# Patient Record
Sex: Male | Born: 1965 | Race: White | Hispanic: No | Marital: Married | Smoking: Former smoker
Health system: Southern US, Community
[De-identification: ages and names within clinical notes are randomized; demographics above are authoritative.]

## PROBLEM LIST (undated history)

## (undated) DIAGNOSIS — F329 Major depressive disorder, single episode, unspecified: Secondary | ICD-10-CM

## (undated) DIAGNOSIS — L409 Psoriasis, unspecified: Secondary | ICD-10-CM

## (undated) DIAGNOSIS — F32A Depression, unspecified: Secondary | ICD-10-CM

## (undated) DIAGNOSIS — K7290 Hepatic failure, unspecified without coma: Secondary | ICD-10-CM

## (undated) DIAGNOSIS — K922 Gastrointestinal hemorrhage, unspecified: Secondary | ICD-10-CM

## (undated) DIAGNOSIS — I8500 Esophageal varices without bleeding: Secondary | ICD-10-CM

## (undated) DIAGNOSIS — K7682 Hepatic encephalopathy: Secondary | ICD-10-CM

## (undated) DIAGNOSIS — K7460 Unspecified cirrhosis of liver: Secondary | ICD-10-CM

## (undated) HISTORY — DX: Hepatic failure, unspecified without coma: K72.90

## (undated) HISTORY — DX: Hepatic encephalopathy: K76.82

## (undated) HISTORY — DX: Depression, unspecified: F32.A

## (undated) HISTORY — DX: Psoriasis, unspecified: L40.9

## (undated) HISTORY — PX: OTHER SURGICAL HISTORY: SHX169

## (undated) HISTORY — DX: Unspecified cirrhosis of liver: K74.60

## (undated) HISTORY — DX: Esophageal varices without bleeding: I85.00

## (undated) HISTORY — DX: Gastrointestinal hemorrhage, unspecified: K92.2

---
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
## 2019-04-22 ENCOUNTER — Encounter: Payer: Self-pay | Admitting: Gastroenterology

## 2019-04-29 ENCOUNTER — Telehealth: Payer: Self-pay

## 2019-05-07 ENCOUNTER — Ambulatory Visit: Payer: Self-pay | Admitting: Gastroenterology

## 2019-05-14 ENCOUNTER — Other Ambulatory Visit: Payer: Self-pay | Admitting: *Deleted

## 2019-05-14 ENCOUNTER — Other Ambulatory Visit: Payer: Self-pay

## 2019-05-14 ENCOUNTER — Encounter: Payer: Self-pay | Admitting: Gastroenterology

## 2019-05-14 ENCOUNTER — Ambulatory Visit (INDEPENDENT_AMBULATORY_CARE_PROVIDER_SITE_OTHER): Payer: Self-pay | Admitting: Gastroenterology

## 2019-05-14 ENCOUNTER — Encounter: Payer: Self-pay | Admitting: *Deleted

## 2019-05-14 VITALS — BP 121/80 | HR 102 | Temp 98.0°F | Ht 72.0 in | Wt 235.4 lb

## 2019-05-14 DIAGNOSIS — R188 Other ascites: Secondary | ICD-10-CM

## 2019-05-14 DIAGNOSIS — K7460 Unspecified cirrhosis of liver: Secondary | ICD-10-CM | POA: Insufficient documentation

## 2019-05-14 DIAGNOSIS — R601 Generalized edema: Secondary | ICD-10-CM

## 2019-05-14 MED ORDER — SPIRONOLACTONE 100 MG PO TABS: 100.0000 mg | ORAL_TABLET | Freq: Every day | ORAL | 5 refills | Status: DC

## 2019-05-14 NOTE — Progress Notes (Signed)
Primary Care Physician:  Monico Blitz, MD  Primary Gastroenterologist:  Garfield Cornea, MD   Chief Complaint  Patient presents with  . Ascites    Fluid in stomach,legs, and groin,cirrhosis    HPI:  Alan Phillips is a 54 y.o. male here at the request of Dr. Manuella Ghazi for further management of cirrhosis.  Patient presents with his wife today.  For the past several months he has noted increased abdominal distention and lower extremity edema.  He notes that his legs are so swollen he cannot put his shoes on.  His testicles are swollen as well.  He is able to urinate.  Over the past year he has noted that his arms and his face have thinned out quite a bit.  He has had increased abdominal bloating and distention.  Recently saw his PCP who obtained labs as outlined below.  Patient reports having an ultrasound that indicated he has cirrhosis.  Patient states he started to wean himself off of alcohol a couple months ago when he found out that he has cirrhosis he stopped drinking completely.  This was 1 month ago.  States he has been drinking for years.  He quit smoking in September 2020.  He reports postprandial vomiting off and on for years.  Saw Dr. Laural Golden in the remote past.  Patient states he was scheduled to have a colonoscopy and endoscopy, actually completed the prep but when he got to the parking lot of the hospital he could not go forward with it.  States he was nervous.  Has not been back since then.  Has not seen a physician in years.  He continues to have intermittent vomiting.  He has constant heartburn.  No dysphagia.  Bowel movements are regular.  No blood in stool or melena.  Wife is concerned that he sleeps a lot.  Takes frequent naps.  Less confusion.  Patient states he feels like he is in a fog at times.  He has had increasing difficulty breathing with abdominal distention.  Family history significant for cirrhosis, paternal uncle, in the setting of alcohol use.  Patient states he recently  was started on Lasix 20 mg daily but noted no improvement in his edema.  He was advised to go to 40 mg daily.  No significant improvement or increase notable urination.  Patient continues to drink about 96 ounces of water daily.  Patient contributed his skin color to daily tanning in tanning bed.    Current Outpatient Medications  Medication Sig Dispense Refill  . furosemide (LASIX) 40 MG tablet Take 40 mg by mouth daily.     No current facility-administered medications for this visit.    Allergies as of 05/14/2019 - Review Complete 05/14/2019  Allergen Reaction Noted  . Codeine Hives 05/14/2019    Past Medical History:  Diagnosis Date  . Psoriasis     Past Surgical History:  Procedure Laterality Date  . none      Family History  Problem Relation Age of Onset  . Cirrhosis Paternal Uncle        etoh    Social History   Socioeconomic History  . Marital status: Married    Spouse name: Not on file  . Number of children: Not on file  . Years of education: Not on file  . Highest education level: Not on file  Occupational History  . Not on file  Tobacco Use  . Smoking status: Former Smoker    Quit date: 11/14/2018    Years  since quitting: 0.4  . Smokeless tobacco: Former Systems developer    Quit date: 05/14/1986  . Tobacco comment: quit last year  Substance and Sexual Activity  . Alcohol use: Not Currently    Comment: h/o longtime etoh abuse but quit 04/2018 when he found out he has cirrhosis (05/14/19)  . Drug use: Not Currently  . Sexual activity: Not on file  Other Topics Concern  . Not on file  Social History Narrative  . Not on file   Social Determinants of Health   Financial Resource Strain:   . Difficulty of Paying Living Expenses:   Food Insecurity:   . Worried About Charity fundraiser in the Last Year:   . Arboriculturist in the Last Year:   Transportation Needs:   . Film/video editor (Medical):   Marland Kitchen Lack of Transportation (Non-Medical):   Physical  Activity:   . Days of Exercise per Week:   . Minutes of Exercise per Session:   Stress:   . Feeling of Stress :   Social Connections:   . Frequency of Communication with Friends and Family:   . Frequency of Social Gatherings with Friends and Family:   . Attends Religious Services:   . Active Member of Clubs or Organizations:   . Attends Archivist Meetings:   Marland Kitchen Marital Status:   Intimate Partner Violence:   . Fear of Current or Ex-Partner:   . Emotionally Abused:   Marland Kitchen Physically Abused:   . Sexually Abused:       ROS:  General: Negative for fever, chills, fatigue, weakness. Diminished appetite with ascites. Thinning face/arms Eyes: Negative for vision changes.  ENT: Negative for hoarseness, difficulty swallowing , nasal congestion. CV: Negative for chest pain, angina, palpitations, +dyspnea on exertion, +peripheral edema.  Respiratory: Negative for dyspnea at rest, dyspnea on exertion, cough, sputum, wheezing.  GI: See history of present illness. GU:  Negative for dysuria, hematuria, urinary incontinence, urinary frequency, nocturnal urination.  MS: Negative for joint pain, low back pain.  Derm: Negative for rash or itching.  Neuro: Negative for weakness, abnormal sensation, seizure, frequent headaches, memory loss, confusion. See hpi Psych: Negative for anxiety, depression, suicidal ideation, hallucinations.  Endo: Negative for unusual weight change.  Heme: Negative for bruising or bleeding. Allergy: Negative for rash or hives.    Physical Examination:  BP 121/80   Pulse (!) 102   Temp 98 F (36.7 C) (Temporal)   Ht 6' (1.829 m)   Wt 235 lb 6.4 oz (106.8 kg)   BMI 31.93 kg/m    General: Well-nourished, well-developed in no acute distress.  Head: Normocephalic, atraumatic.   Eyes: Conjunctiva pink, mild scleral icterus. Mouth:masked. Neck: Supple without thyromegaly, masses, or lymphadenopathy.  Lungs: Clear to auscultation bilaterally.  Heart: Regular  rate and rhythm, no murmurs rubs or gallops.  Abdomen: Bowel sounds are normal, nontender, no hepatosplenomegaly or masses, no abdominal bruits, no rebound or guarding.  Distended and tense. umb hernia reducible. Edema in dependent abd. Rectal: not performed Extremities:   No clubbing or deformities.  3+ pitting edemas to thighs.  Neuro: Alert and oriented x 4 , grossly normal neurologically.  Skin: Warm and dry, no rash. Tanned.  Psych: Alert and cooperative, normal mood and affect.  Labs: Labs from February 2021: Hemoglobin 12.9, hematocrit 34.5, MCV 98, platelets 113,000, white blood cell count 5600, glucose 118, creatinine 0.83, sodium 131, potassium 3.8, albumin 2.7, total bilirubin 6.8, alk phos 120, AST 88 elevated, ALT  32, amylase 52, lipase 37, hepatitis C antibody nonreactive, TSH 2.790  Imaging Studies: No results found.  Impression/plan:  Pleasant 54 year old gentleman presenting with decompensated liver disease in the setting of chronic alcohol abuse.  Reports ultrasound showed cirrhosis.  Suspect recent alcoholic hepatitis based on labs from 1 month ago.  He reports no alcohol in 1 month now.  He has been on Lasix 40 mg daily with little benefit with regards to peripheral edema.  Over the past year he has noted wasting in the face and arms with increasing edema in the legs and abdominal distention.  He was negative for hepatitis C, we will check hepatitis A and B viral markers.  We will screen for hemochromatosis.  Obtain labs to calculate meld na. We will schedule him for abdominal paracentesis with fluid analysis.  He will receive IV albumin 25 g at time of first paracentesis, plans to remove 4 L only this time.  If tolerated he can have greater amount removed with subsequent paracenteses.  Advised patient today, we will add Aldactone 100 mg daily to his Lasix 40 mg daily.  He will start a 2 g sodium diet.  He may require fluid restrictions is currently drinking over 2 L of water  daily.  If he does not respond to oral diuretic regimen he may require inpatient management.  We requested copy of recent ultrasound for review.  In the near future he will need an upper endoscopy to screen for esophageal varices.  Encouraged ongoing alcohol cessation.  He is aware that we will need to have frequent evaluation with [place], hepatoma screening every 6 months.  Ultrasound and will plan for liver transplant evaluation once we receive further data.

## 2019-05-14 NOTE — Patient Instructions (Signed)
1. Congratulations on your sobriety. Keep up the good work! 2. Please go to [facility] for labs. We will contact you with results as available. 3. Abdominal tap as scheduled.  4. Continue lasix 40mg  daily. Add aldactone 100mg  daily. RX sent to pharmacy.  5. Limit sodium to 2000mg  daily.    Cirrhosis  Cirrhosis is long-term (chronic) liver injury. The liver is the body's largest internal organ, and it performs many functions. It converts food into energy, removes toxic material from the blood, makes important proteins, and absorbs necessary vitamins from food. In cirrhosis, healthy liver cells are replaced by scar tissue. This prevents blood from flowing through the liver, making it difficult for the liver to function. Scarring of the liver cannot be reversed, but treatment can prevent it from getting worse. What are the causes? Common causes of this condition are hepatitis C and long-term alcohol abuse. Other causes include:  Nonalcoholic fatty liver disease. This happens when fat is deposited in the liver by causes other than alcohol.  Hepatitis B infection.  Autoimmune hepatitis. In this condition, the body's defense system (immune system) mistakenly attacks the liver cells, causing irritation and swelling (inflammation).  Diseases that cause blockage of ducts inside the liver.  Inherited liver diseases, such as hemochromatosis. This is one of the most common inherited liver diseases. In this disease, deposits of iron collect in the liver and other organs.  Reactions to certain long-term medicines, such as amiodarone, a heart medicine.  Parasitic infections. These include schistosomiasis, which is caused by a flatworm.  Long-term contact to certain toxins. These toxins include certain organic solvents, such as toluene and chloroform. What increases the risk? You are more likely to develop this condition if:  You have certain types of viral hepatitis.  You abuse alcohol,  especially if you are male.  You are overweight.  You share needles.  You have unprotected sex with someone who has viral hepatitis. What are the signs or symptoms? You may not have any signs and symptoms at first. Symptoms may not develop until the damage to your liver starts to get worse. Early symptoms may include:  Weakness and tiredness (fatigue).  Changes in sleep patterns or having trouble sleeping.  Itchiness.  Tenderness in the right-upper part of your abdomen.  Weight loss and muscle loss.  Nausea.  Loss of appetite.  Appearance of tiny blood vessels under the skin. Later symptoms may include:  Fatigue or weakness that is getting worse.  Yellow skin and eyes (jaundice).  Buildup of fluid in the abdomen (ascites). You may notice that your clothes are tight around your waist.  Weight gain.  Swelling of the feet and ankles (edema).  Trouble breathing.  Easy bruising and bleeding.  Vomiting blood.  Black or bloody stool.  Mental confusion. How is this diagnosed? Your health care provider may suspect cirrhosis based on your symptoms and medical history, especially if you have other medical conditions or a history of alcohol abuse. Your health care provider will do a physical exam to feel your liver and to check for signs of cirrhosis. He or she may perform other tests, including:  Blood tests to check: ? For hepatitis B or C. ? Kidney function. ? Liver function.  Imaging tests such as: ? MRI or CT scan to look for changes seen in advanced cirrhosis. ? Ultrasound to see if normal liver tissue is being replaced by scar tissue.  A procedure in which a long needle is used to take  a sample of liver tissue to be checked in a lab (biopsy). Liver biopsy can confirm the diagnosis of cirrhosis. How is this treated? Treatment for this condition depends on how damaged your liver is and what caused the damage. It may include treating the symptoms of cirrhosis,  or treating the underlying causes in order to slow the damage. Treatment may include:  Making lifestyle changes, such as: ? Eating a healthy diet. You may need to work with your health care provider or a diet and nutrition specialist (dietitian) to develop an eating plan. ? Restricting salt intake. ? Maintaining a healthy weight. ? Not abusing drugs or alcohol.  Taking medicines to: ? Treat liver infections or other infections. ? Control itching. ? Reduce fluid buildup. ? Reduce certain blood toxins. ? Reduce risk of bleeding from enlarged blood vessels in the stomach or esophagus (varices).  Liver transplant. In this procedure, a liver from a donor is used to replace your diseased liver. This is done if cirrhosis has caused liver failure. Other treatments and procedures may be done depending on the problems that you get from cirrhosis. Common problems include liver-related kidney failure (hepatorenal syndrome). Follow these instructions at home:   Take medicines only as told by your health care provider. Do not use medicines that are toxic to your liver. Ask your health care provider before taking any new medicines, including over-the-counter medicines.  Rest as needed.  Eat a well-balanced diet. Ask your health care provider or dietitian for more information.  Limit your salt or water intake, if your health care provider asks you to do this.  Do not drink alcohol. This is especially important if you are taking acetaminophen.  Keep all follow-up visits as told by your health care provider. This is important. Contact a health care provider if you:  Have fatigue or weakness that is getting worse.  Develop swelling of the hands, feet, legs, or face.  Have a fever.  Develop loss of appetite.  Have nausea or vomiting.  Develop jaundice.  Develop easy bruising or bleeding. Get help right away if you:  Vomit bright red blood or a material that looks like coffee  grounds.  Have blood in your stools.  Notice that your stools appear black and tarry.  Become confused.  Have chest pain or trouble breathing. Summary  Cirrhosis is chronic liver injury. Liver damage cannot be reversed. Common causes are hepatitis C and long-term alcohol abuse.  Tests used to diagnose cirrhosis include blood tests, imaging tests, and liver biopsy.  Treatment for this condition involves treating the underlying cause. Avoid alcohol, drugs, salt, and medicines that may damage your liver.  Contact your health care provider if you develop ascites, edema, jaundice, fever, nausea or vomiting, easy bruising or bleeding, or worsening fatigue. This information is not intended to replace advice given to you by your health care provider. Make sure you discuss any questions you have with your health care provider. Document Revised: 06/12/2018 Document Reviewed: 01/10/2017 Elsevier Patient Education  [place].

## 2019-05-15 ENCOUNTER — Other Ambulatory Visit (HOSPITAL_COMMUNITY)
Admission: RE | Admit: 2019-05-15 | Discharge: 2019-05-15 | Disposition: A | Discharge status: Home / Self Care | Payer: Self-pay | Source: Ambulatory Visit | Attending: Gastroenterology | Admitting: Gastroenterology

## 2019-05-15 ENCOUNTER — Encounter: Payer: Self-pay | Admitting: Gastroenterology

## 2019-05-15 DIAGNOSIS — R601 Generalized edema: Secondary | ICD-10-CM | POA: Insufficient documentation

## 2019-05-15 DIAGNOSIS — R188 Other ascites: Secondary | ICD-10-CM | POA: Insufficient documentation

## 2019-05-15 DIAGNOSIS — K7460 Unspecified cirrhosis of liver: Secondary | ICD-10-CM | POA: Insufficient documentation

## 2019-05-15 LAB — CBC WITH DIFFERENTIAL/PLATELET
Abs Immature Granulocytes: 0.02 10*3/uL (ref 0.00–0.07)
Basophils Absolute: 0.1 10*3/uL (ref 0.0–0.1)
Basophils Relative: 1 %
Eosinophils Absolute: 0.2 10*3/uL (ref 0.0–0.5)
Eosinophils Relative: 3 %
HCT: 33.7 % — ABNORMAL LOW (ref 39.0–52.0)
Hemoglobin: 11.5 g/dL — ABNORMAL LOW (ref 13.0–17.0)
Immature Granulocytes: 0 %
Lymphocytes Relative: 21 %
Lymphs Abs: 1.2 10*3/uL (ref 0.7–4.0)
MCH: 36.6 pg — ABNORMAL HIGH (ref 26.0–34.0)
MCHC: 34.1 g/dL (ref 30.0–36.0)
MCV: 107.3 fL — ABNORMAL HIGH (ref 80.0–100.0)
Monocytes Absolute: 0.9 10*3/uL (ref 0.1–1.0)
Monocytes Relative: 16 %
Neutro Abs: 3.1 10*3/uL (ref 1.7–7.7)
Neutrophils Relative %: 59 %
Platelets: 131 10*3/uL — ABNORMAL LOW (ref 150–400)
RBC: 3.14 MIL/uL — ABNORMAL LOW (ref 4.22–5.81)
RDW: 12.9 % (ref 11.5–15.5)
WBC: 5.4 10*3/uL (ref 4.0–10.5)
nRBC: 0 % (ref 0.0–0.2)

## 2019-05-15 LAB — IRON AND TIBC
Iron: 85 ug/dL (ref 45–182)
Saturation Ratios: 43 % — ABNORMAL HIGH (ref 17.9–39.5)
TIBC: 199 ug/dL — ABNORMAL LOW (ref 250–450)
UIBC: 114 ug/dL

## 2019-05-15 LAB — COMPREHENSIVE METABOLIC PANEL
ALT: 32 U/L (ref 0–44)
AST: 68 U/L — ABNORMAL HIGH (ref 15–41)
Albumin: 2.4 g/dL — ABNORMAL LOW (ref 3.5–5.0)
Alkaline Phosphatase: 82 U/L (ref 38–126)
Anion gap: 9 (ref 5–15)
BUN: 11 mg/dL (ref 6–20)
CO2: 29 mmol/L (ref 22–32)
Calcium: 8.3 mg/dL — ABNORMAL LOW (ref 8.9–10.3)
Chloride: 93 mmol/L — ABNORMAL LOW (ref 98–111)
Creatinine, Ser: 0.75 mg/dL (ref 0.61–1.24)
GFR calc Af Amer: >60 mL/min (ref >60)
GFR calc non Af Amer: >60 mL/min (ref >60)
Glucose, Bld: 130 mg/dL — ABNORMAL HIGH (ref 70–99)
Potassium: 3.1 mmol/L — ABNORMAL LOW (ref 3.5–5.1)
Sodium: 131 mmol/L — ABNORMAL LOW (ref 135–145)
Total Bilirubin: 4 mg/dL — ABNORMAL HIGH (ref 0.3–1.2)
Total Protein: 6.9 g/dL (ref 6.5–8.1)

## 2019-05-15 LAB — HEPATITIS A ANTIBODY, TOTAL: hep A Total Ab: NONREACTIVE

## 2019-05-15 LAB — HEPATITIS B SURFACE ANTIBODY,QUALITATIVE: Hep B S Ab: NONREACTIVE

## 2019-05-15 LAB — HEPATITIS B SURFACE ANTIGEN: Hepatitis B Surface Ag: NONREACTIVE

## 2019-05-15 LAB — FERRITIN: Ferritin: 541 ng/mL — ABNORMAL HIGH (ref 24–336)

## 2019-05-15 LAB — PROTIME-INR
INR: 1.6 — ABNORMAL HIGH (ref 0.8–1.2)
Prothrombin Time: 19.3 seconds — ABNORMAL HIGH (ref 11.4–15.2)

## 2019-05-15 LAB — HEPATITIS B CORE ANTIBODY, TOTAL: Hep B Core Total Ab: NONREACTIVE

## 2019-05-16 ENCOUNTER — Other Ambulatory Visit: Payer: Self-pay

## 2019-05-16 ENCOUNTER — Ambulatory Visit (HOSPITAL_COMMUNITY)
Admission: RE | Admit: 2019-05-16 | Discharge: 2019-05-16 | Disposition: A | Discharge status: Home / Self Care | Payer: Self-pay | Source: Ambulatory Visit | Attending: Gastroenterology | Admitting: Gastroenterology

## 2019-05-16 ENCOUNTER — Encounter (HOSPITAL_COMMUNITY): Payer: Self-pay

## 2019-05-16 DIAGNOSIS — E876 Hypokalemia: Secondary | ICD-10-CM

## 2019-05-16 DIAGNOSIS — R188 Other ascites: Secondary | ICD-10-CM | POA: Insufficient documentation

## 2019-05-16 LAB — BODY FLUID CELL COUNT WITH DIFFERENTIAL
Eos, Fluid: 0 %
Lymphs, Fluid: 63 %
Monocyte-Macrophage-Serous Fluid: 27 % — ABNORMAL LOW (ref 50–90)
Neutrophil Count, Fluid: 10 % (ref 0–25)
Total Nucleated Cell Count, Fluid: 71 cu mm (ref 0–1000)

## 2019-05-16 MED ORDER — POTASSIUM CHLORIDE CRYS ER 10 MEQ PO TBCR: 20.0000 meq | EXTENDED_RELEASE_TABLET | Freq: Two times a day (BID) | ORAL | Status: AC

## 2019-05-16 NOTE — Progress Notes (Signed)
Paracentesis complete no signs of distress.  

## 2019-05-18 LAB — ACID FAST SMEAR (AFB, MYCOBACTERIA): Acid Fast Smear: NEGATIVE

## 2019-05-19 ENCOUNTER — Other Ambulatory Visit: Payer: Self-pay

## 2019-05-19 ENCOUNTER — Telehealth: Payer: Self-pay | Admitting: Internal Medicine

## 2019-05-19 DIAGNOSIS — R188 Other ascites: Secondary | ICD-10-CM

## 2019-05-19 LAB — CYTOLOGY - NON PAP

## 2019-05-19 NOTE — Telephone Encounter (Signed)
Spoke with pts spouse. Pt had fluid drawn off last week. Spouse said they were told that pt will come back next  to have fluids drawn off. Spouse said they don't have an appointment to have fluid drawn off and would like our office to schedule it.

## 2019-05-19 NOTE — Telephone Encounter (Signed)
Patient likely still has significant ascites as his first tap was limited to 4 liters.   OK to schedule LVAP. Albumin per protocol. Send fluid for cell count with diff, anerobic/aerobic cultures in blood culture bottles.

## 2019-05-19 NOTE — Telephone Encounter (Signed)
Para scheduled for 05/22/19 at 9:00am, arrive at 8:30am.  Called and informed wife of para appt.

## 2019-05-19 NOTE — Telephone Encounter (Signed)
Please call patient's wife at [phone]. She has multiple questions about patient's medications and when he is due his next para.

## 2019-05-19 NOTE — Telephone Encounter (Signed)
Routing to [facility] 

## 2019-05-21 ENCOUNTER — Ambulatory Visit: Payer: Self-pay | Admitting: Physician Assistant

## 2019-05-22 ENCOUNTER — Other Ambulatory Visit: Payer: Self-pay

## 2019-05-22 ENCOUNTER — Ambulatory Visit (HOSPITAL_COMMUNITY)
Admission: RE | Admit: 2019-05-22 | Discharge: 2019-05-22 | Disposition: A | Discharge status: Home / Self Care | Payer: Self-pay | Source: Ambulatory Visit | Attending: Gastroenterology | Admitting: Gastroenterology

## 2019-05-22 ENCOUNTER — Encounter (HOSPITAL_COMMUNITY): Payer: Self-pay

## 2019-05-22 DIAGNOSIS — R188 Other ascites: Secondary | ICD-10-CM | POA: Insufficient documentation

## 2019-05-22 LAB — BODY FLUID CELL COUNT WITH DIFFERENTIAL
Eos, Fluid: 0 %
Lymphs, Fluid: 63 %
Monocyte-Macrophage-Serous Fluid: 30 % — ABNORMAL LOW (ref 50–90)
Neutrophil Count, Fluid: 7 % (ref 0–25)
Other Cells, Fluid: REACTIVE %
Total Nucleated Cell Count, Fluid: 61 cu mm (ref 0–1000)

## 2019-05-22 LAB — GRAM STAIN: Gram Stain: NONE SEEN

## 2019-05-22 MED ORDER — ALBUMIN HUMAN 25 % IV SOLN: 50.0000 g | Freq: Once | INTRAVENOUS | Status: AC

## 2019-05-22 MED ORDER — ALBUMIN HUMAN 25 % IV SOLN
INTRAVENOUS | Status: AC
  Administered 2019-05-22: 50 g via INTRAVENOUS
  Filled 2019-05-22: qty 200

## 2019-05-22 NOTE — Procedures (Signed)
PreOperative Dx: Cirrhosis, ascites Postoperative Dx: Cirrhosis, ascites Procedure:   US guided paracentesis Radiologist:  Tyron Russell Anesthesia:  10 ml of1% lidocaine Specimen:  11.2 L of yellow ascitic fluid EBL:   < 1 ml Complications: None

## 2019-05-22 NOTE — Progress Notes (Addendum)
Thoracentesis complete no signs of distress.  

## 2019-05-23 LAB — PATHOLOGIST SMEAR REVIEW

## 2019-05-26 ENCOUNTER — Other Ambulatory Visit: Payer: Self-pay

## 2019-05-26 ENCOUNTER — Encounter: Payer: Self-pay | Admitting: Physician Assistant

## 2019-05-26 ENCOUNTER — Ambulatory Visit: Payer: Self-pay | Admitting: Physician Assistant

## 2019-05-26 VITALS — BP 108/70 | HR 105 | Temp 98.8°F | Wt 209.8 lb

## 2019-05-26 DIAGNOSIS — K7460 Unspecified cirrhosis of liver: Secondary | ICD-10-CM

## 2019-05-26 DIAGNOSIS — E876 Hypokalemia: Secondary | ICD-10-CM

## 2019-05-26 DIAGNOSIS — Z7689 Persons encountering health services in other specified circumstances: Secondary | ICD-10-CM

## 2019-05-26 DIAGNOSIS — R011 Cardiac murmur, unspecified: Secondary | ICD-10-CM

## 2019-05-26 NOTE — Progress Notes (Signed)
BP 108/70   Pulse (!) 105   Temp 98.8 F (37.1 C)   Wt 209 lb 12.8 oz (95.2 kg)   SpO2 97%   BMI 28.45 kg/m    Subjective:    Patient ID: Alan Phillips, male    DOB: January 24, 1966, 54 y.o.   MRN: [id number]  HPI: Alan Phillips is a 54 y.o. male presenting on 05/26/2019 for New Patient (Initial Visit)   HPI    Pt had a negative covid 19 screening questionnaire.    Pt is 33yoM with cirrhosis who presents to office today to establish care.    Pt previously seen by Dr Manuella Ghazi but pt says he won't be going back there.  Pt had paracentesis last week and says his breathing is better since paracentesis.  He has long hx heavy etoh but stopped a month ago.    He says fluid is increasing again in his abdomen.   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  He has Not worked for past 2 or 3 years.   He says he feels tired most of the time.  He has only a little hurting - in the abdomen when it gets tight.   He says he has had No medical care in a long time until recently.     Pt says he is covered with care connect but isn't sure but has spoken with Johnnette Barrios about financial assistance.    Pt stopped smoking in 2020.     Relevant past medical, surgical, family and social history reviewed and updated as indicated. Interim medical history since our last visit reviewed. Allergies and medications reviewed and updated.   Current Outpatient Medications:  .  furosemide (LASIX) 40 MG tablet, Take 40 mg by mouth daily., Disp: , Rfl:  .  spironolactone (ALDACTONE) 100 MG tablet, Take 1 tablet (100 mg total) by mouth daily., Disp: 30 tablet, Rfl: 5     Review of Systems  Per HPI unless specifically indicated above     Objective:    BP 108/70   Pulse (!) 105   Temp 98.8 F (37.1 C)   Wt 209 lb 12.8 oz (95.2 kg)   SpO2 97%   BMI 28.45 kg/m   Wt Readings from Last 3 Encounters:  05/26/19 209 lb 12.8 oz (95.2 kg)  05/14/19 235 lb 6.4 oz (106.8 kg)    Physical Exam Vitals  and nursing note reviewed.  Constitutional:      General: He is not in acute distress.    Appearance: He is well-developed. He is ill-appearing.     Comments: Pt appears chronically ill  HENT:     Head: Normocephalic and atraumatic.  Eyes:     Pupils: Pupils are equal, round, and reactive to light.  Neck:     Thyroid: No thyromegaly.  Cardiovascular:     Rate and Rhythm: Normal rate and regular rhythm.     Heart sounds: Murmur present.  Pulmonary:     Effort: Pulmonary effort is normal.     Breath sounds: Normal breath sounds. No wheezing or rales.  Abdominal:     General: Abdomen is protuberant. Bowel sounds are normal. There is distension.     Palpations: Abdomen is soft. There is fluid wave.     Tenderness: There is abdominal tenderness.     Hernia: A hernia is present. Hernia is present in the umbilical area.     Comments: + ascities.  Umbilical hernia reducible.  Very  mildly tender diffusely.  Musculoskeletal:     Cervical back: Neck supple.     Right lower leg: Edema present.     Left lower leg: Edema present.     Comments: 2-3+ pitting edema BLE up to knees (thighs not evaluated)  Lymphadenopathy:     Cervical: No cervical adenopathy.  Skin:    General: Skin is warm and dry.     Findings: No rash.     Comments: Heavily tanned with multiple tattoos over body.  Neurological:     Mental Status: He is alert and oriented to person, place, and time.  Psychiatric:        Behavior: Behavior normal.        Thought Content: Thought content normal.           Assessment & Plan:    Encounter Diagnoses  Name Primary?  . Encounter to establish care Yes  . Cirrhosis of liver with ascites, unspecified hepatic cirrhosis type (HCC)   . Hypokalemia   . Murmur, heart      -Pt needs another paracentesis -he Needs f/u with GI.  He is encouraged to call their office to schedule follow up appointment -will order echo to evaluate murmur -Pt says he has charity care -Defer  psa -Check cmp -pt is given samples ensure to help nutritional needs -pt to follow up in 3 months.  He is to contact office sooner prn

## 2019-05-26 NOTE — Patient Instructions (Signed)
Call GI for follow up appointment  Get bloodwork drawn/labs  You will be called with echo appointment

## 2019-05-27 ENCOUNTER — Telehealth: Payer: Self-pay | Admitting: Gastroenterology

## 2019-05-27 LAB — CULTURE, BODY FLUID W GRAM STAIN -BOTTLE: Culture: NO GROWTH

## 2019-05-27 NOTE — Telephone Encounter (Signed)
Reviewed ultrasound from February 2021 at [facility]  Shrunken nodular liver compatible with cirrhosis, associated moderate to large volume ascites.  Spleen normal.  No evidence of hepatoma.  Please make arrangements for office visit in the next 1 week to follow-up on cirrhosis, anasarca.  Need to see if Alan Phillips diuretic regimen is working.  We will also schedule for EGD and make arrangements to liver transplant center for evaluation.

## 2019-05-27 NOTE — Telephone Encounter (Signed)
Spoke with pts spouse. Pt is scheduled for 06/11/2019. Per spouse request.

## 2019-06-02 ENCOUNTER — Ambulatory Visit (HOSPITAL_COMMUNITY)
Admission: RE | Admit: 2019-06-02 | Discharge: 2019-06-02 | Disposition: A | Discharge status: Home / Self Care | Payer: Self-pay | Source: Ambulatory Visit | Attending: Physician Assistant | Admitting: Physician Assistant

## 2019-06-02 ENCOUNTER — Other Ambulatory Visit (HOSPITAL_COMMUNITY): Payer: Self-pay

## 2019-06-02 DIAGNOSIS — R011 Cardiac murmur, unspecified: Secondary | ICD-10-CM

## 2019-06-02 NOTE — Progress Notes (Signed)
*  PRELIMINARY RESULTS* Echocardiogram 2D Echocardiogram has been performed.  Alan Phillips 06/02/2019, 4:02 PM

## 2019-06-11 ENCOUNTER — Other Ambulatory Visit: Payer: Self-pay

## 2019-06-11 ENCOUNTER — Encounter: Payer: Self-pay | Admitting: Gastroenterology

## 2019-06-11 ENCOUNTER — Encounter: Payer: Self-pay | Admitting: *Deleted

## 2019-06-11 ENCOUNTER — Ambulatory Visit (INDEPENDENT_AMBULATORY_CARE_PROVIDER_SITE_OTHER): Payer: Self-pay | Admitting: Gastroenterology

## 2019-06-11 ENCOUNTER — Other Ambulatory Visit: Payer: Self-pay | Admitting: *Deleted

## 2019-06-11 VITALS — BP 117/79 | HR 114 | Temp 97.8°F | Ht 72.0 in | Wt 223.0 lb

## 2019-06-11 DIAGNOSIS — R188 Other ascites: Secondary | ICD-10-CM

## 2019-06-11 DIAGNOSIS — K7460 Unspecified cirrhosis of liver: Secondary | ICD-10-CM

## 2019-06-11 DIAGNOSIS — R601 Generalized edema: Secondary | ICD-10-CM

## 2019-06-11 NOTE — Patient Instructions (Signed)
1. Please have labs done the day you go to have fluid drawn off your abdomen.  2. Please have your Hep A and B vaccinations started in the next couple of months.  3. We will plan for upper endoscopy this summer. We will call and get you scheduled in the next couple of weeks.

## 2019-06-11 NOTE — Progress Notes (Signed)
Primary Care Physician: Soyla Dryer, PA-C  Primary Gastroenterologist:  Garfield Cornea, MD   Chief Complaint  Patient presents with  . Follow-up    cirrhosis of liver with ascites    HPI: Alan Phillips is a 54 y.o. male here for follow up of cirrhosis and anasarca. First met on 05/14/19. Cirrhosis likely due to etoh abuse. He quit all etoh in 04/2019. Labs last month: negative viral markers, iron level normal, sats at 43%, ferritin 541 possibly due to his recent etoh use rather than hemochromatosis. Plan to recheck in couple of months and if still elevated, check hemochromatosis gene labs. His first LVAP of 4 liters was well tolerated. Fluid analysis unremarkable. His second LVAP with 11 liters removed was associated with significant fatigue, dizziness, and patient laid in bed for four days.   Patient reports breathing a lot better after taps. Abdomen still significantly distended. Lower extremity edema slightly improved. No longer having testicular swelling. Appetite is good. prilosec otc helping heartburn. Sometimes has some stabbing pain at umbilicus. Having BM every day, small amount. No n/v. He decided to alter his diuretics on his own. Taking lasix 3m one day and then aldactone 1021mdaily the next day. Seems to be urinating more with this regimen.   Plans to go to health department for Hep a and b vaccines due to cost.    Wt Readings from Last 3 Encounters:  06/11/19 223 lb (101.2 kg)  05/26/19 209 lb 12.8 oz (95.2 kg)  05/14/19 235 lb 6.4 oz (106.8 kg)      Current Outpatient Medications  Medication Sig Dispense Refill  . furosemide (LASIX) 40 MG tablet Take 40 mg by mouth daily.    . Marland Kitchenmeprazole (PRILOSEC) 20 MG capsule Take 20 mg by mouth daily.    . Marland Kitchenpironolactone (ALDACTONE) 100 MG tablet Take 1 tablet (100 mg total) by mouth daily. 30 tablet 5   No current facility-administered medications for this visit.    Allergies as of 06/11/2019 - Review Complete  06/11/2019  Allergen Reaction Noted  . Codeine Hives 05/14/2019   Past Medical History:  Diagnosis Date  . Cirrhosis of liver ([place]  . Depression   . Psoriasis    Past Surgical History:  Procedure Laterality Date  . none     Family History  Problem Relation Age of Onset  . Cirrhosis Paternal Uncle        etoh  . Cancer Paternal Uncle   . Alcohol abuse Paternal Uncle   . Alzheimer's disease Father   . Heart failure Maternal Uncle    Social History   Tobacco Use  . Smoking status: Former Smoker    Packs/day: 0.50    Years: 42.00    Pack years: 21.00    Types: Cigarettes    Quit date: 12/05/2018    Years since quitting: 0.5  . Smokeless tobacco: Former [employer] developer  Quit date: 05/14/1986  . Tobacco comment: quit last year  Substance Use Topics  . Alcohol use: Not Currently    Comment: h/o longtime etoh abuse but quit 04/2019 when he found out he has cirrhosis (05/14/19)  . Drug use: Yes    Types: Marijuana    ROS:  General: Negative for anorexia, weight loss, fever, chills, fatigue,+ weakness. ENT: Negative for hoarseness, difficulty swallowing , nasal congestion. CV: Negative for chest pain, angina, palpitations, dyspnea on exertion, +peripheral edema.  Respiratory: Negative for dyspnea at rest, dyspnea on exertion, cough, sputum, wheezing.  GI: See history of present illness. GU:  Negative for dysuria, hematuria, urinary incontinence, urinary frequency, nocturnal urination.  Endo: Negative for unusual weight change.    Physical Examination:   BP 117/79   Pulse (!) 114   Temp 97.8 F (36.6 C) (Temporal)   Ht 6' (1.829 m)   Wt 223 lb (101.2 kg)   BMI 30.24 kg/m   General: chronically ill appearing male in NAD. Accompanied by wife.   Eyes: No icterus. Mouth: masked. Lungs: Clear to auscultation bilaterally.  Heart: Regular rate and rhythm, no murmurs rubs or gallops.  Abdomen: Bowel sounds are normal, nontender, distended and moderately tense, no abdominal  bruits, no rebound or guarding.  Umbilical hernia easily reducible. Thin overlying skin. Some pitting edema in dependent portions. Extremities: 2-3+ pitting edema to the knees bilaterally. No clubbing or deformities. Neuro: Alert and oriented x 4   Skin: Warm and dry, no jaundice.   Psych: Alert and cooperative, normal mood and affect.  Labs:  Lab Results  Component Value Date   CREATININE 0.75 05/15/2019   BUN 11 05/15/2019   NA 131 (L) 05/15/2019   K 3.1 (L) 05/15/2019   CL 93 (L) 05/15/2019   CO2 29 05/15/2019   Lab Results  Component Value Date   ALT 32 05/15/2019   AST 68 (H) 05/15/2019   ALKPHOS 82 05/15/2019   BILITOT 4.0 (H) 05/15/2019   Lab Results  Component Value Date   WBC 5.4 05/15/2019   HGB 11.5 (L) 05/15/2019   HCT 33.7 (L) 05/15/2019   MCV 107.3 (H) 05/15/2019   PLT 131 (L) 05/15/2019   Lab Results  Component Value Date   INR 1.6 (H) 05/15/2019    Imaging Studies: US Paracentesis  Result Date: 05/22/2019 INDICATION: Cirrhosis, ascites EXAM: ULTRASOUND GUIDED DIAGNOSTIC AND THERAPEUTIC PARACENTESIS MEDICATIONS: None COMPLICATIONS: None immediate PROCEDURE: Informed written consent was obtained from the patient after a discussion of the risks, benefits and alternatives to treatment. A timeout was performed prior to the initiation of the procedure. Initial ultrasound scanning demonstrates a large amount of ascites within the right lower abdominal quadrant. The right lower abdomen was prepped and draped in the usual sterile fashion. 1% lidocaine was used for local anesthesia. Following this, a 5 [place] Yueh catheter was introduced. An ultrasound image was saved for documentation purposes. The paracentesis was performed. The catheter was removed and a dressing was applied. The patient tolerated the procedure well without immediate post procedural complication. Patient received post-procedure intravenous albumin; see nursing notes for details. FINDINGS: A total of  approximately 11.2 L of yellow ascitic fluid was removed. Samples were sent to the laboratory as requested by the clinical team. IMPRESSION: Successful ultrasound-guided paracentesis yielding 11.2 liters of peritoneal fluid. Electronically Signed   By: Lavonia Dana M.D.   On: 05/22/2019 10:55   US Paracentesis  Result Date: 05/16/2019 INDICATION: 54 year old male with history of ascites. EXAM: ULTRASOUND GUIDED  PARACENTESIS MEDICATIONS: None. COMPLICATIONS: None immediate. PROCEDURE: Informed written consent was obtained from the patient after a discussion of the risks, benefits and alternatives to treatment. A timeout was performed prior to the initiation of the procedure. Initial ultrasound scanning demonstrates a large amount of ascites within the right lower abdominal quadrant. The right lower abdomen was prepped and draped in the usual sterile fashion. 1% lidocaine was used for local anesthesia. Following this, a Yueh centesis catheter was introduced. An ultrasound image was saved for documentation purposes. The paracentesis was performed. The catheter was removed  and a dressing was applied. The patient tolerated the procedure well without immediate post procedural complication. Patient received post-procedure intravenous albumin; see nursing notes for details. FINDINGS: A total of approximately 4.0 L of series fluid was removed. Samples were sent to the laboratory as requested by the clinical team. IMPRESSION: Successful ultrasound-guided paracentesis yielding 4.0 liters of peritoneal fluid. Electronically Signed   By: Vinnie Langton M.D.   On: 05/16/2019 12:41   ECHOCARDIOGRAM COMPLETE  Result Date: 06/02/2019    ECHOCARDIOGRAM REPORT   Patient Name:   LEELAND LOVELADY Date of Exam: 06/02/2019 Medical Rec #:  [id number]      Height:       72.0 in Accession #:    [id number]     Weight:       209.8 lb Date of Birth:  [birth year]      BSA:          2.174 m Patient Age:    67 years       BP:           127/73  mmHg Patient Gender: M              HR:           103 bpm. Exam Location:  [facility] Procedure: 2D Echo, Cardiac Doppler and Color Doppler Indications:    R01.1 (ICD-10-CM) - Murmur, heart  History:        Patient has no prior history of Echocardiogram examinations.                 Anasarca,Cirrhosis of liver.  Sonographer:    Alvino Chapel RCS Referring Phys: [id number] [facility]  Sonographer Comments: Extremely difficult to do subcostal imaging due to the Anasarca and size of abdominal distension. IMPRESSIONS  1. ? Ascites and abnormal echogenicity of liver on subcostal images.  2. Left ventricular ejection fraction, by estimation, is 60 to 65%. The left ventricle has normal function. The left ventricle has no regional wall motion abnormalities. Left ventricular diastolic parameters were normal.  3. Right ventricular systolic function is normal. The right ventricular size is normal.  4. The mitral valve is normal in structure. Trivial mitral valve regurgitation. No evidence of mitral stenosis.  5. The aortic valve is normal in structure. Aortic valve regurgitation is not visualized. No aortic stenosis is present.  6. Shadowing in descending thoracic aorta on supra sternal notch with normal color flow.  7. The inferior vena cava is normal in size with greater than 50% respiratory variability, suggesting right atrial pressure of 3 mmHg. FINDINGS  Left Ventricle: Left ventricular ejection fraction, by estimation, is 60 to 65%. The left ventricle has normal function. The left ventricle has no regional wall motion abnormalities. The left ventricular internal cavity size was normal in size. There is  no left ventricular hypertrophy. Left ventricular diastolic parameters were normal. Right Ventricle: The right ventricular size is normal. No increase in right ventricular wall thickness. Right ventricular systolic function is normal. Left Atrium: Left atrial size was normal in size. Right Atrium: Right atrial size was  normal in size. Pericardium: There is no evidence of pericardial effusion. Mitral Valve: The mitral valve is normal in structure. There is mild thickening of the mitral valve leaflet(s). Normal mobility of the mitral valve leaflets. Trivial mitral valve regurgitation. No evidence of mitral valve stenosis. Tricuspid Valve: The tricuspid valve is normal in structure. Tricuspid valve regurgitation is not demonstrated. No evidence of tricuspid stenosis. Aortic Valve: The aortic valve  is normal in structure. Aortic valve regurgitation is not visualized. No aortic stenosis is present. Pulmonic Valve: The pulmonic valve was normal in structure. Pulmonic valve regurgitation is not visualized. No evidence of pulmonic stenosis. Aorta: Shadowing in descending thoracic aorta on supra sternal notch with normal color flow. The aortic root is normal in size and structure. Venous: The inferior vena cava is normal in size with greater than 50% respiratory variability, suggesting right atrial pressure of 3 mmHg. IAS/Shunts: No atrial level shunt detected by color flow Doppler. Additional Comments: ? Ascites and abnormal echogenicity of liver on subcostal images.  LEFT VENTRICLE PLAX 2D LVIDd:         4.97 cm  Diastology LVIDs:         2.79 cm  LV e' lateral:   13.40 cm/s LV PW:         0.94 cm  LV E/e' lateral: 4.7 LV IVS:        0.91 cm  LV e' medial:    11.10 cm/s LVOT diam:     1.80 cm  LV E/e' medial:  5.7 LV SV:         51 LV SV Index:   23 LVOT Area:     2.54 cm  RIGHT VENTRICLE RV Mid diam:    3.49 cm RV S prime:     20.90 cm/s TAPSE (M-mode): 2.6 cm LEFT ATRIUM             Index LA diam:        3.60 cm 1.66 cm/m LA Vol (A2C):   46.2 ml 21.25 ml/m LA Vol (A4C):   59.7 ml 27.46 ml/m LA Biplane Vol: 55.9 ml 25.71 ml/m  AORTIC VALVE LVOT Vmax:   101.00 cm/s LVOT Vmean:  72.500 cm/s LVOT VTI:    0.199 m  AORTA Ao Root diam: 3.80 cm MITRAL VALVE MV Area (PHT): 5.54 cm    SHUNTS MV Decel Time: 137 msec    Systemic VTI:  0.20 m  MV E velocity: 63.20 cm/s  Systemic Diam: 1.80 cm MV A velocity: 58.80 cm/s MV E/A ratio:  1.07 Jenkins Rouge MD Electronically signed by Jenkins Rouge MD Signature Date/Time: 06/02/2019/4:19:37 PM    Final

## 2019-06-12 ENCOUNTER — Telehealth: Payer: Self-pay | Admitting: Gastroenterology

## 2019-06-12 DIAGNOSIS — K7460 Unspecified cirrhosis of liver: Secondary | ICD-10-CM

## 2019-06-12 DIAGNOSIS — R188 Other ascites: Secondary | ICD-10-CM

## 2019-06-12 NOTE — Addendum Note (Signed)
Addended by: Armstead Peaks on: 06/12/2019 03:16 PM   Modules accepted: Orders

## 2019-06-12 NOTE — Telephone Encounter (Signed)
Please schedule EGD for variceal screening with RMR with propofol in 2-3 months.   Please start referral process for liver transplant evaluation at [facility]. I'm not sure of status for insurance or medicaid. This may be hindering factor in getting him an appointment.

## 2019-06-12 NOTE — Telephone Encounter (Signed)
Called pt, VM not set up  Referral faxed to [facility]

## 2019-06-12 NOTE — Telephone Encounter (Signed)
Spoke with spouse Junious Dresser. Patient is scheduled for EGD with propofol with RMR 6/15 at 11:00am. Aware will mail instructions with pre-op/covid test appt. Confirmed mailing address is correct in system. She is also aware referral has been faxed to [facility] for Liver transplant eval. As of right now he is not set up to get any type of insurance/medicaid but she is contact care connect to see what she needs to do.

## 2019-06-12 NOTE — Assessment & Plan Note (Addendum)
54 y/o male with decompensated etoh cirrhosis, with significant ascites/anasarca. Weight is only down 12 pounds after total of 15 liters of ascitic fluid removed last month. He has had no etoh since 04/2019. He has changed his diet, consuming 2 gram sodium restricted diet, limiting fluids to 64 ounces daily. There has been some modest improvement in lower extremity edema. We will set him up for another LVAP, no more than 6 liters removed as he did not tolerate 11 liters last time. Plan to give albumin per protocol at time of LVAP. Will update cmet.   Plan to repeat ferritin in couple of months. If still elevated, then HFE gene labs.   Plan for EGD for esophageal variceal screening later this summer. Will go ahead and get scheduled. Plan for deep sedation.  I have discussed the risks, alternatives, benefits with regards to but not limited to the risk of reaction to medication, bleeding, infection, perforation and the patient is agreeable to proceed. Written consent to be obtained.  Will start referral process for liver transplant evaluation. Patient is aware of need to abstain from etoh. He desires evaluation so that he can take appropriate steps needed to become transplant eligible.

## 2019-06-13 ENCOUNTER — Encounter: Payer: Self-pay | Admitting: *Deleted

## 2019-06-16 ENCOUNTER — Other Ambulatory Visit: Payer: Self-pay

## 2019-06-16 ENCOUNTER — Ambulatory Visit (HOSPITAL_COMMUNITY)
Admission: RE | Admit: 2019-06-16 | Discharge: 2019-06-16 | Disposition: A | Discharge status: Home / Self Care | Payer: Self-pay | Source: Ambulatory Visit | Attending: Gastroenterology | Admitting: Gastroenterology

## 2019-06-16 ENCOUNTER — Encounter (HOSPITAL_COMMUNITY): Payer: Self-pay

## 2019-06-16 ENCOUNTER — Other Ambulatory Visit (HOSPITAL_COMMUNITY)
Admission: RE | Admit: 2019-06-16 | Discharge: 2019-06-16 | Disposition: A | Discharge status: Home / Self Care | Payer: Self-pay | Source: Ambulatory Visit | Attending: Gastroenterology | Admitting: Gastroenterology

## 2019-06-16 DIAGNOSIS — K7460 Unspecified cirrhosis of liver: Secondary | ICD-10-CM | POA: Insufficient documentation

## 2019-06-16 DIAGNOSIS — R188 Other ascites: Secondary | ICD-10-CM | POA: Insufficient documentation

## 2019-06-16 LAB — COMPREHENSIVE METABOLIC PANEL
ALT: 32 U/L (ref 0–44)
AST: 70 U/L — ABNORMAL HIGH (ref 15–41)
Albumin: 2.3 g/dL — ABNORMAL LOW (ref 3.5–5.0)
Alkaline Phosphatase: 82 U/L (ref 38–126)
Anion gap: 10 (ref 5–15)
BUN: 18 mg/dL (ref 6–20)
CO2: 26 mmol/L (ref 22–32)
Calcium: 8.3 mg/dL — ABNORMAL LOW (ref 8.9–10.3)
Chloride: 86 mmol/L — ABNORMAL LOW (ref 98–111)
Creatinine, Ser: 0.96 mg/dL (ref 0.61–1.24)
GFR calc Af Amer: >60 mL/min (ref >60)
GFR calc non Af Amer: >60 mL/min (ref >60)
Glucose, Bld: 117 mg/dL — ABNORMAL HIGH (ref 70–99)
Potassium: 3.9 mmol/L (ref 3.5–5.1)
Sodium: 122 mmol/L — ABNORMAL LOW (ref 135–145)
Total Bilirubin: 5.9 mg/dL — ABNORMAL HIGH (ref 0.3–1.2)
Total Protein: 6.9 g/dL (ref 6.5–8.1)

## 2019-06-16 LAB — BODY FLUID CELL COUNT WITH DIFFERENTIAL
Eos, Fluid: 0 %
Lymphs, Fluid: 21 %
Monocyte-Macrophage-Serous Fluid: 23 % — ABNORMAL LOW (ref 50–90)
Neutrophil Count, Fluid: 56 % — ABNORMAL HIGH (ref 0–25)
Total Nucleated Cell Count, Fluid: 401 cu mm (ref 0–1000)

## 2019-06-16 LAB — GRAM STAIN

## 2019-06-16 LAB — PROTIME-INR
INR: 1.6 — ABNORMAL HIGH (ref 0.8–1.2)
Prothrombin Time: 19.3 seconds — ABNORMAL HIGH (ref 11.4–15.2)

## 2019-06-16 NOTE — Procedures (Signed)
PreOperative Dx: Cirrhosis, ascites Postoperative Dx: Cirrhosis, ascites Procedure:   US guided paracentesis Radiologist:  Izzabell Klasen Anesthesia:  10 ml of1% lidocaine Specimen:  5 L of yellow ascitic fluid EBL:   < 1 ml Complications: None  

## 2019-06-17 ENCOUNTER — Other Ambulatory Visit: Payer: Self-pay | Admitting: Emergency Medicine

## 2019-06-17 ENCOUNTER — Telehealth: Payer: Self-pay | Admitting: Internal Medicine

## 2019-06-17 ENCOUNTER — Other Ambulatory Visit: Payer: Self-pay

## 2019-06-17 DIAGNOSIS — K7460 Unspecified cirrhosis of liver: Secondary | ICD-10-CM

## 2019-06-17 DIAGNOSIS — R888 Abnormal findings in other body fluids and substances: Secondary | ICD-10-CM

## 2019-06-17 LAB — PATHOLOGIST SMEAR REVIEW

## 2019-06-17 MED ORDER — LACTULOSE 20 GM/30ML PO SOLN: 30.0000 mL | Freq: Two times a day (BID) | ORAL | 1 refills | Status: DC

## 2019-06-17 NOTE — Telephone Encounter (Signed)
Spoke with wife. Patient complains of constipation. Last BM one week ago. Abdomen feels better after tap. Goes for another tap tomorrow to recheck cell count. Going to have new labs.    Lactulose sent to pharmacy. Await tap. Will call patient/wife again tomorrow after results.  If worsening pain, fever, he will go to er.

## 2019-06-17 NOTE — Telephone Encounter (Signed)
Pt's wife called saying she was returning a call from LSL. I asked her to clarify if it was LL or LSL and she said LSL. I told her that LSL was with patients and I would let her know that she had called. She asked for LSL to call her after 230 pm today. [phone]

## 2019-06-18 ENCOUNTER — Emergency Department (HOSPITAL_COMMUNITY): Payer: Self-pay

## 2019-06-18 ENCOUNTER — Inpatient Hospital Stay (HOSPITAL_COMMUNITY)
Admission: EM | Admit: 2019-06-18 | Discharge: 2019-06-21 | DRG: 377 | Disposition: A | Discharge status: Home / Self Care | Payer: Self-pay | Attending: Internal Medicine | Admitting: Internal Medicine

## 2019-06-18 ENCOUNTER — Other Ambulatory Visit: Payer: Self-pay

## 2019-06-18 ENCOUNTER — Encounter (HOSPITAL_COMMUNITY): Payer: Self-pay

## 2019-06-18 ENCOUNTER — Telehealth: Payer: Self-pay | Admitting: Internal Medicine

## 2019-06-18 ENCOUNTER — Ambulatory Visit (HOSPITAL_COMMUNITY): Admission: RE | Admit: 2019-06-18 | Payer: Self-pay | Source: Ambulatory Visit

## 2019-06-18 DIAGNOSIS — I509 Heart failure, unspecified: Secondary | ICD-10-CM | POA: Diagnosis present

## 2019-06-18 DIAGNOSIS — K2971 Gastritis, unspecified, with bleeding: Principal | ICD-10-CM | POA: Diagnosis present

## 2019-06-18 DIAGNOSIS — L409 Psoriasis, unspecified: Secondary | ICD-10-CM | POA: Diagnosis present

## 2019-06-18 DIAGNOSIS — Z8249 Family history of ischemic heart disease and other diseases of the circulatory system: Secondary | ICD-10-CM

## 2019-06-18 DIAGNOSIS — T502X5A Adverse effect of carbonic-anhydrase inhibitors, benzothiadiazides and other diuretics, initial encounter: Secondary | ICD-10-CM | POA: Diagnosis present

## 2019-06-18 DIAGNOSIS — R9431 Abnormal electrocardiogram [ECG] [EKG]: Secondary | ICD-10-CM | POA: Diagnosis present

## 2019-06-18 DIAGNOSIS — R188 Other ascites: Secondary | ICD-10-CM

## 2019-06-18 DIAGNOSIS — Z809 Family history of malignant neoplasm, unspecified: Secondary | ICD-10-CM

## 2019-06-18 DIAGNOSIS — Z20822 Contact with and (suspected) exposure to covid-19: Secondary | ICD-10-CM | POA: Diagnosis present

## 2019-06-18 DIAGNOSIS — D509 Iron deficiency anemia, unspecified: Secondary | ICD-10-CM | POA: Diagnosis present

## 2019-06-18 DIAGNOSIS — Z87891 Personal history of nicotine dependence: Secondary | ICD-10-CM

## 2019-06-18 DIAGNOSIS — K449 Diaphragmatic hernia without obstruction or gangrene: Secondary | ICD-10-CM | POA: Diagnosis present

## 2019-06-18 DIAGNOSIS — K920 Hematemesis: Secondary | ICD-10-CM | POA: Diagnosis present

## 2019-06-18 DIAGNOSIS — K7031 Alcoholic cirrhosis of liver with ascites: Secondary | ICD-10-CM | POA: Diagnosis present

## 2019-06-18 DIAGNOSIS — D539 Nutritional anemia, unspecified: Secondary | ICD-10-CM | POA: Diagnosis present

## 2019-06-18 DIAGNOSIS — Z79899 Other long term (current) drug therapy: Secondary | ICD-10-CM

## 2019-06-18 DIAGNOSIS — K7040 Alcoholic hepatic failure without coma: Secondary | ICD-10-CM | POA: Diagnosis present

## 2019-06-18 DIAGNOSIS — D62 Acute posthemorrhagic anemia: Secondary | ICD-10-CM | POA: Diagnosis present

## 2019-06-18 DIAGNOSIS — K922 Gastrointestinal hemorrhage, unspecified: Secondary | ICD-10-CM | POA: Diagnosis present

## 2019-06-18 DIAGNOSIS — F1010 Alcohol abuse, uncomplicated: Secondary | ICD-10-CM | POA: Diagnosis present

## 2019-06-18 DIAGNOSIS — K209 Esophagitis, unspecified without bleeding: Secondary | ICD-10-CM | POA: Diagnosis present

## 2019-06-18 DIAGNOSIS — Z820 Family history of epilepsy and other diseases of the nervous system: Secondary | ICD-10-CM

## 2019-06-18 DIAGNOSIS — F329 Major depressive disorder, single episode, unspecified: Secondary | ICD-10-CM | POA: Diagnosis present

## 2019-06-18 DIAGNOSIS — J189 Pneumonia, unspecified organism: Secondary | ICD-10-CM | POA: Diagnosis present

## 2019-06-18 DIAGNOSIS — I8510 Secondary esophageal varices without bleeding: Secondary | ICD-10-CM | POA: Diagnosis present

## 2019-06-18 DIAGNOSIS — D649 Anemia, unspecified: Secondary | ICD-10-CM | POA: Diagnosis present

## 2019-06-18 DIAGNOSIS — E876 Hypokalemia: Secondary | ICD-10-CM | POA: Diagnosis present

## 2019-06-18 DIAGNOSIS — M1990 Unspecified osteoarthritis, unspecified site: Secondary | ICD-10-CM | POA: Diagnosis present

## 2019-06-18 DIAGNOSIS — K7460 Unspecified cirrhosis of liver: Secondary | ICD-10-CM | POA: Diagnosis present

## 2019-06-18 DIAGNOSIS — D689 Coagulation defect, unspecified: Secondary | ICD-10-CM | POA: Diagnosis present

## 2019-06-18 DIAGNOSIS — E871 Hypo-osmolality and hyponatremia: Secondary | ICD-10-CM | POA: Diagnosis present

## 2019-06-18 LAB — CBC WITH DIFFERENTIAL/PLATELET
Abs Immature Granulocytes: 0.05 10*3/uL (ref 0.00–0.07)
Basophils Absolute: 0 10*3/uL (ref 0.0–0.1)
Basophils Relative: 0 %
Eosinophils Absolute: 0.1 10*3/uL (ref 0.0–0.5)
Eosinophils Relative: 1 %
HCT: 32.9 % — ABNORMAL LOW (ref 39.0–52.0)
Hemoglobin: 11.5 g/dL — ABNORMAL LOW (ref 13.0–17.0)
Immature Granulocytes: 1 %
Lymphocytes Relative: 10 %
Lymphs Abs: 0.9 10*3/uL (ref 0.7–4.0)
MCH: 36.4 pg — ABNORMAL HIGH (ref 26.0–34.0)
MCHC: 35 g/dL (ref 30.0–36.0)
MCV: 104.1 fL — ABNORMAL HIGH (ref 80.0–100.0)
Monocytes Absolute: 1.5 10*3/uL — ABNORMAL HIGH (ref 0.1–1.0)
Monocytes Relative: 16 %
Neutro Abs: 6.6 10*3/uL (ref 1.7–7.7)
Neutrophils Relative %: 72 %
Platelets: 218 10*3/uL (ref 150–400)
RBC: 3.16 MIL/uL — ABNORMAL LOW (ref 4.22–5.81)
RDW: 13 % (ref 11.5–15.5)
WBC: 9.2 10*3/uL (ref 4.0–10.5)
nRBC: 0 % (ref 0.0–0.2)

## 2019-06-18 LAB — RESPIRATORY PANEL BY RT PCR (FLU A&B, COVID)
Influenza A by PCR: NEGATIVE
Influenza B by PCR: NEGATIVE
SARS Coronavirus 2 by RT PCR: NEGATIVE

## 2019-06-18 LAB — APTT: aPTT: 45 seconds — ABNORMAL HIGH (ref 24–36)

## 2019-06-18 LAB — GRAM STAIN: Gram Stain: NONE SEEN

## 2019-06-18 LAB — BODY FLUID CELL COUNT WITH DIFFERENTIAL
Eos, Fluid: 0 %
Lymphs, Fluid: 67 %
Monocyte-Macrophage-Serous Fluid: 24 % — ABNORMAL LOW (ref 50–90)
Neutrophil Count, Fluid: 9 % (ref 0–25)
Total Nucleated Cell Count, Fluid: 120 cu mm (ref 0–1000)

## 2019-06-18 LAB — PROTIME-INR
INR: 1.7 — ABNORMAL HIGH (ref 0.8–1.2)
Prothrombin Time: 19.6 seconds — ABNORMAL HIGH (ref 11.4–15.2)

## 2019-06-18 LAB — BRAIN NATRIURETIC PEPTIDE: B Natriuretic Peptide: 38 pg/mL (ref 0.0–100.0)

## 2019-06-18 LAB — LIPASE, BLOOD: Lipase: 34 U/L (ref 11–51)

## 2019-06-18 LAB — COMPREHENSIVE METABOLIC PANEL
ALT: 36 U/L (ref 0–44)
AST: 84 U/L — ABNORMAL HIGH (ref 15–41)
Albumin: 2.2 g/dL — ABNORMAL LOW (ref 3.5–5.0)
Alkaline Phosphatase: 75 U/L (ref 38–126)
Anion gap: 9 (ref 5–15)
BUN: 19 mg/dL (ref 6–20)
CO2: 26 mmol/L (ref 22–32)
Calcium: 8.1 mg/dL — ABNORMAL LOW (ref 8.9–10.3)
Chloride: 87 mmol/L — ABNORMAL LOW (ref 98–111)
Creatinine, Ser: 0.83 mg/dL (ref 0.61–1.24)
GFR calc Af Amer: >60 mL/min (ref >60)
GFR calc non Af Amer: >60 mL/min (ref >60)
Glucose, Bld: 128 mg/dL — ABNORMAL HIGH (ref 70–99)
Potassium: 3.4 mmol/L — ABNORMAL LOW (ref 3.5–5.1)
Sodium: 122 mmol/L — ABNORMAL LOW (ref 135–145)
Total Bilirubin: 6.3 mg/dL — ABNORMAL HIGH (ref 0.3–1.2)
Total Protein: 6.8 g/dL (ref 6.5–8.1)

## 2019-06-18 LAB — MAGNESIUM: Magnesium: 1.9 mg/dL (ref 1.7–2.4)

## 2019-06-18 LAB — HEMOGLOBIN AND HEMATOCRIT, BLOOD
HCT: 31.7 % — ABNORMAL LOW (ref 39.0–52.0)
Hemoglobin: 10.9 g/dL — ABNORMAL LOW (ref 13.0–17.0)

## 2019-06-18 LAB — AMMONIA: Ammonia: 24 umol/L (ref 9–35)

## 2019-06-18 LAB — ETHANOL: Alcohol, Ethyl (B): <10 mg/dL (ref <10)

## 2019-06-18 LAB — PHOSPHORUS: Phosphorus: 3.3 mg/dL (ref 2.5–4.6)

## 2019-06-18 MED ORDER — PROCHLORPERAZINE EDISYLATE 10 MG/2ML IJ SOLN: 10.0000 mg | Freq: Four times a day (QID) | INTRAMUSCULAR | Status: DC | PRN

## 2019-06-18 MED ORDER — PANTOPRAZOLE SODIUM 40 MG IV SOLR
40.0000 mg | Freq: Two times a day (BID) | INTRAVENOUS | Status: DC
  Administered 2019-06-19 – 2019-06-21 (×5): 40 mg via INTRAVENOUS
  Filled 2019-06-18 (×6): qty 40

## 2019-06-18 MED ORDER — POTASSIUM CHLORIDE 10 MEQ/100ML IV SOLN
10.0000 meq | INTRAVENOUS | Status: AC
  Administered 2019-06-18 – 2019-06-19 (×3): 10 meq via INTRAVENOUS
  Filled 2019-06-18 (×3): qty 100

## 2019-06-18 MED ORDER — VITAMIN K1 10 MG/ML IJ SOLN
5.0000 mg | Freq: Once | INTRAVENOUS | Status: AC
  Administered 2019-06-19: 5 mg via INTRAVENOUS
  Filled 2019-06-18: qty 0.5

## 2019-06-18 MED ORDER — POTASSIUM CHLORIDE 10 MEQ/100ML IV SOLN: 10.0000 meq | INTRAVENOUS | Status: DC

## 2019-06-18 MED ORDER — MAGNESIUM SULFATE 2 GM/50ML IV SOLN
2.0000 g | Freq: Once | INTRAVENOUS | Status: AC
  Administered 2019-06-18: 2 g via INTRAVENOUS
  Filled 2019-06-18: qty 50

## 2019-06-18 MED ORDER — SODIUM CHLORIDE 0.9 % IV SOLN: INTRAVENOUS | Status: DC

## 2019-06-18 MED ORDER — PANTOPRAZOLE SODIUM 40 MG IV SOLR
40.0000 mg | Freq: Once | INTRAVENOUS | Status: AC
  Administered 2019-06-18: 40 mg via INTRAVENOUS
  Filled 2019-06-18: qty 40

## 2019-06-18 NOTE — ED Provider Notes (Signed)
[facility] EMERGENCY DEPARTMENT Provider Note   CSN: [id number] Arrival date & time: 06/18/19  1557     History Chief Complaint  Patient presents with  . Nausea  . Emesis    Alan Phillips is a 54 y.o. male.  Patient was supposed to have paracentesis done today.  But was too weak.  Has been having vomiting since Monday evening.  Patient with new diagnosis of cirrhosis of the liver due to significant alcohol abuse.  Has not had any alcohol recently.  Patient had paracentesis done on Monday.  They drew off 5 L.  Last week they drew off more but it made him very lightheaded blood pressure problems so they only drew all 5 earlier.  Plan was to draw another 5 today.  Patient also followed by gastroenterology Dr. Buford Dresser.  Dr. Work planning an upper endoscopy.  Sure all part of the work-up for the cirrhosis with ascites.  Patient very pleasant.  Patient very jaundiced in color.  Mentating fine.  Patient states that he has been vomiting black or coffee-ground stuff since Monday evening.  Family member states that he had a lot of black stuff overnight.  Denies any red blood.  Abdomen he says is very distended and uncomfortable.  But denies any significant tenderness.        Past Medical History:  Diagnosis Date  . Cirrhosis of liver ([place])   . Depression   . Psoriasis     Patient Active Problem List   Diagnosis Date Noted  . Hepatic cirrhosis ([place]) 05/14/2019  . Anasarca 05/14/2019    Past Surgical History:  Procedure Laterality Date  . none         Family History  Problem Relation Age of Onset  . Cirrhosis Paternal Uncle        etoh  . Cancer Paternal Uncle   . Alcohol abuse Paternal Uncle   . Alzheimer's disease Father   . Heart failure Maternal Uncle     Social History   Tobacco Use  . Smoking status: Former Smoker    Packs/day: 0.50    Years: 42.00    Pack years: 21.00    Types: Cigarettes    Quit date: 12/05/2018    Years since quitting: 0.5  . Smokeless  tobacco: Former Systems developer    Quit date: 05/14/1986  . Tobacco comment: quit last year  Substance Use Topics  . Alcohol use: Not Currently    Comment: h/o longtime etoh abuse but quit 04/2019 when he found out he has cirrhosis (05/14/19)  . Drug use: Yes    Types: Marijuana    Home Medications Prior to Admission medications   Medication Sig Start Date End Date Taking? Authorizing Provider  furosemide (LASIX) 40 MG tablet Take 40 mg by mouth daily.   Yes [provider]  omeprazole (PRILOSEC) 20 MG capsule Take 20 mg by mouth daily.   Yes [provider]  spironolactone (ALDACTONE) 100 MG tablet Take 1 tablet (100 mg total) by mouth daily. 05/14/19  Yes Mahala Menghini, PA-C  Lactulose 20 GM/30ML SOLN Take 30 mLs (20 g total) by mouth in the morning and at bedtime. 06/17/19   Mahala Menghini, PA-C    Allergies    Codeine  Review of Systems   Review of Systems  Constitutional: Negative for chills and fever.  HENT: Negative for rhinorrhea and sore throat.   Eyes: Negative for visual disturbance.  Respiratory: Negative for cough and shortness of breath.  Cardiovascular: Negative for chest pain and leg swelling.  Gastrointestinal: Positive for abdominal distention, abdominal pain, nausea and vomiting. Negative for blood in stool and diarrhea.  Genitourinary: Negative for dysuria.  Musculoskeletal: Negative for back pain and neck pain.  Skin: Negative for rash.  Neurological: Negative for dizziness, light-headedness and headaches.  Hematological: Does not bruise/bleed easily.  Psychiatric/Behavioral: Negative for confusion.    Physical Exam Updated Vital Signs BP 115/70   Pulse (!) 109   Temp 98.4 F (36.9 C) (Oral)   Resp 12   Ht 1.829 m (6')   Wt 98.6 kg   SpO2 99%   BMI 29.47 kg/m   Physical Exam Vitals and nursing note reviewed.  Constitutional:      Appearance: Normal appearance. He is well-developed.  HENT:     Head: Normocephalic and atraumatic.    Eyes:     General: Scleral icterus present.     Extraocular Movements: Extraocular movements intact.     Conjunctiva/sclera: Conjunctivae normal.     Pupils: Pupils are equal, round, and reactive to light.  Cardiovascular:     Rate and Rhythm: Normal rate and regular rhythm.     Heart sounds: No murmur.  Pulmonary:     Effort: Pulmonary effort is normal. No respiratory distress.     Breath sounds: Normal breath sounds.  Abdominal:     General: There is distension.     Palpations: Abdomen is soft.     Tenderness: There is abdominal tenderness.     Comments: Very slight diffuse tenderness.  Very distended consistent with ascites.  Musculoskeletal:        General: Swelling present. Normal range of motion.     Cervical back: Normal range of motion and neck supple.  Skin:    General: Skin is warm and dry.  Neurological:     General: No focal deficit present.     Mental Status: He is alert and oriented to person, place, and time.     ED Results / Procedures / Treatments   Labs (all labs ordered are listed, but only abnormal results are displayed) Labs Reviewed  COMPREHENSIVE METABOLIC PANEL - Abnormal; Notable for the following components:      Result Value   Sodium 122 (*)    Potassium 3.4 (*)    Chloride 87 (*)    Glucose, Bld 128 (*)    Calcium 8.1 (*)    Albumin 2.2 (*)    AST 84 (*)    Total Bilirubin 6.3 (*)    All other components within normal limits  CBC WITH DIFFERENTIAL/PLATELET - Abnormal; Notable for the following components:   RBC 3.16 (*)    Hemoglobin 11.5 (*)    HCT 32.9 (*)    MCV 104.1 (*)    MCH 36.4 (*)    Monocytes Absolute 1.5 (*)    All other components within normal limits  PROTIME-INR - Abnormal; Notable for the following components:   Prothrombin Time 19.6 (*)    INR 1.7 (*)    All other components within normal limits  APTT - Abnormal; Notable for the following components:   aPTT 45 (*)    All other components within normal limits  BODY  FLUID CELL COUNT WITH DIFFERENTIAL - Abnormal; Notable for the following components:   Appearance, Fluid CLEAR (*)    Monocyte-Macrophage-Serous Fluid 24 (*)    All other components within normal limits  CULTURE, BODY FLUID-BOTTLE  GRAM STAIN  RESPIRATORY PANEL BY RT PCR (FLU  A&B, COVID)  BRAIN NATRIURETIC PEPTIDE  ETHANOL  LIPASE, BLOOD  AMMONIA  PATHOLOGIST SMEAR REVIEW  TYPE AND SCREEN    EKG EKG Interpretation  Date/Time:  Wednesday June 18 2019 17:17:42 EDT Ventricular Rate:  105 PR Interval:    QRS Duration: 100 QT Interval:  394 QTC Calculation: 521 R Axis:   102 Text Interpretation: Right and left arm electrode reversal, interpretation assumes no reversal Sinus tachycardia Probable lateral infarct, age indeterminate Prolonged QT interval Baseline wander in lead(s) V3 No previous ECGs available Confirmed by Vanetta Mulders [phone]) on 06/18/2019 5:34:44 PM   Radiology US Paracentesis  Result Date: 06/18/2019 INDICATION: Cirrhosis, ascites EXAM: ULTRASOUND GUIDED DIAGNOSTIC AND THERAPEUTIC PARACENTESIS MEDICATIONS: None COMPLICATIONS: None immediate PROCEDURE: Informed written consent was obtained from the patient after a discussion of the risks, benefits and alternatives to treatment. A timeout was performed prior to the initiation of the procedure. Initial ultrasound scanning demonstrates a large amount of ascites within the right lower abdominal quadrant. The right lower abdomen was prepped and draped in the usual sterile fashion. 1% lidocaine was used for local anesthesia. Following this, a 5 [place] Yueh catheter was introduced. An ultrasound image was saved for documentation purposes. The paracentesis was performed. The catheter was removed and a dressing was applied. The patient tolerated the procedure well without immediate post procedural complication. Patient received post-procedure intravenous albumin; see nursing notes for details. FINDINGS: A total of approximately  6.2 L of yellow ascitic fluid was removed. Samples were sent to the laboratory as requested by the clinical team. IMPRESSION: Successful ultrasound-guided paracentesis yielding 6.2 liters of peritoneal fluid. Electronically Signed   By: Ulyses Southward M.D.   On: 06/18/2019 18:38   DG Chest Port 1 View  Result Date: 06/18/2019 CLINICAL DATA:  Vomiting EXAM: PORTABLE CHEST 1 VIEW COMPARISON:  07/07/2012 FINDINGS: Small left-sided pleural effusion or pleural thickening. Mild airspace disease at the left base. Normal heart size. No pneumothorax. IMPRESSION: Small left-sided pleural effusion or pleural thickening with atelectasis or mild pneumonia at the left base. Electronically Signed   By: Jasmine Pang M.D.   On: 06/18/2019 19:30    Procedures Procedures (including critical care time)  CRITICAL CARE Performed by: Vanetta Mulders Total critical care time: 30 minutes Critical care time was exclusive of separately billable procedures and treating other patients. Critical care was necessary to treat or prevent imminent or life-threatening deterioration. Critical care was time spent personally by me on the following activities: development of treatment plan with patient and/or surrogate as well as nursing, discussions with consultants, evaluation of patient's response to treatment, examination of patient, obtaining history from patient or surrogate, ordering and performing treatments and interventions, ordering and review of laboratory studies, ordering and review of radiographic studies, pulse oximetry and re-evaluation of patient's condition.   Medications Ordered in ED Medications  pantoprazole (PROTONIX) injection 40 mg (40 mg Intravenous Given 06/18/19 1819)    ED Course  I have reviewed the triage vital signs and the nursing notes.  Pertinent labs & imaging results that were available during my care of the patient were reviewed by me and considered in my medical decision making (see chart for  details).    MDM Rules/Calculators/A&P                      Patient with significant ascites and cirrhosis.  Ammonia levels normal.  Blood counts here are normal.  No vomiting since he received Protonix.  No vomiting of blood.  Persistent tachycardia.  Did arrange for paracentesis they drew off 5 L in the room.  Patient more comfortable abdomen now not tender.  Based on blood counts white blood cell count does not appear to be consistent with infection.  Patient will receive slow IV hydration.  Admission.  Hospitalist will admit.  To be consideration for may be to have Dr. Jena Gauss see him tomorrow may be consider upper endoscopy.  Other than the tachycardia in the low 100s patient hemodynamically stable from blood pressure standpoint.  Hospitalist will admit to telemetry.  Feel he does not need stepdown at this time since he has had the paracentesis.   Final Clinical Impression(s) / ED Diagnoses Final diagnoses:  Hematemesis, presence of nausea not specified  Alcoholic cirrhosis of liver with ascites [facility])    Rx / DC Orders ED Discharge Orders    None       Vanetta Mulders, MD 06/18/19 2330

## 2019-06-18 NOTE — Telephone Encounter (Signed)
Noted. Spoke with pts spouse. She is aware of LSL recommendations. She is aware that pt needs to get to the ED ASAP. Of pt can't go to the ED due to being to weak, pt needs to call EMS.

## 2019-06-18 NOTE — H&P (Signed)
History and Physical    Alan Phillips YWV:[id number] DOB: 12-22-1965 DOA: 06/18/2019  PCP: Jacquelin Hawking, PA-C   Patient coming from: Home.  I have personally briefly reviewed patient's old medical records in [facility]  Chief Complaint: Nausea and vomiting since yesterday.  HPI: Alan Phillips is a 54 y.o. male with medical history significant of depression, psoriasis, recently diagnosed with cirrhosis of the liver who is coming to the emergency department due to having multiple episodes of nausea and emesis since yesterday.  Since he was diagnosed with cirrhosis he has been needing frequent paracentesis.  He had a 5 L US guided paracentesis done on on Monday and was supposed to have another one today, but was unable to get it done earlier as scheduled due to numerous episodes of emesis on Monday evening, yesterday Tuesday and once today.  He reports that the emesis contents from earlier today was dark and he had an episode of melena as well.  He has also been complaining of lower extremity edema.  He has had some postural lightheadedness.  He denies abdominal pain, diarrhea, constipation, dysuria, frequency or hematuria.  He denies fever, chills, rhinorrhea, sore throat, dyspnea, wheezing or hemoptysis.  No chest pain, palpitations, diaphoresis, PND, but has has some orthopnea due to arthritis.  No polyuria, polydipsia, polyphagia or blurred vision.  ED Course: Initial vital signs temperature 98.4 F, pulse 109, respirations 12, blood pressure 126/71 mmHg and O2 sat 99% on room air.  He received 40 mg of pantoprazole IVP x1 in the ED.  He underwent US guided paracentesis of 6.2 L of clear ascitic fluid with Dr. Tyron Russell from radiology.  His CBC shows a white count of 9.2, hemoglobin 11.5 g/dL and platelets 854.  PT was 19.6, INR 1.7 and APTT 45.  Lipase was 34 units/L.  Sodium 122, potassium 3.4, chloride 87 and CO2 26 mmol/L.  Glucose 128, BUN 19, creatinine 0.83 and calcium 8.1 mg/dL.   Total protein 6.8, albumin 2.2 g/dL.  AST is 84, ALT 36 and alkaline phosphatase 75 units/L.  Total bilirubin is 6.3 mg/dL.  Ammonia, alcohol and BNP within normal limits.  Peritoneal fluid cytology did not show signs of infection.  His chest radiograph shows small left-sided pleural effusion or thickening with atelectasis or mild pneumonia at the left base.  Review of Systems: As per HPI otherwise 10 point review of systems negative.   Past Medical History:  Diagnosis Date  . Cirrhosis of liver ([facility])   . Depression   . Psoriasis     Past Surgical History:  Procedure Laterality Date  . none       reports that he quit smoking about 6 months ago. His smoking use included cigarettes. He has a 21.00 pack-year smoking history. He quit smokeless tobacco use about 33 years ago. He reports previous alcohol use. He reports current drug use. Drug: Marijuana.  Allergies  Allergen Reactions  . Codeine Hives    Family History  Problem Relation Age of Onset  . Cirrhosis Paternal Uncle        etoh  . Cancer Paternal Uncle   . Alcohol abuse Paternal Uncle   . Alzheimer's disease Father   . Heart failure Maternal Uncle    Prior to Admission medications   Medication Sig Start Date End Date Taking? Authorizing Provider  furosemide (LASIX) 40 MG tablet Take 40 mg by mouth daily.   Yes [provider]  omeprazole (PRILOSEC) 20 MG capsule Take 20 mg by mouth  daily.   Yes [provider]  spironolactone (ALDACTONE) 100 MG tablet Take 1 tablet (100 mg total) by mouth daily. 05/14/19  Yes Mahala Menghini, PA-C  Lactulose 20 GM/30ML SOLN Take 30 mLs (20 g total) by mouth in the morning and at bedtime. 06/17/19   Mahala Menghini, PA-C    Physical Exam: Vitals:   06/18/19 1830 06/18/19 1900 06/18/19 1930 06/18/19 2000  BP: 115/70 116/63 111/66 116/63  Pulse: (!) 109 (!) 106 (!) 110 (!) 109  Resp: 12 13 15 14   Temp:      TempSrc:      SpO2: 99% 99% 98% 99%  Weight:      Height:         Constitutional: Looks chronically ill, but currently in NAD, calm, comfortable Eyes: PERRL, lids and conjunctivae normal.  Positive scleral icterus. ENMT: Mucous membranes are moist. Posterior pharynx clear of any exudate or lesions. Neck: normal, supple, no masses, no thyromegaly Respiratory: Decreased breath sounds on bases, otherwise clear to auscultation bilaterally, no wheezing, no crackles. Normal respiratory effort. No accessory muscle use.  Cardiovascular: Regular rate and rhythm, no murmurs / rubs / gallops. No extremity edema. 2+ pedal pulses. No carotid bruits.  Abdomen: Distended, ascites, positive redox supple/nontender umbilical hernia, positive caput medusae.  BS positive, soft, no tenderness, no masses palpated. Musculoskeletal: no clubbing / cyanosis. Good ROM, no contractures. Normal muscle tone.  Skin: Jaundiced skin.  There are some areas of ecchymosis on extremities. Neurologic: CN 2-12 grossly intact. Sensation intact, DTR normal. Strength 5/5 in all 4.  Psychiatric: Normal judgment and insight. Alert and oriented x 3. Normal mood.   Labs on Admission: I have personally reviewed following labs and imaging studies  CBC: Recent Labs  Lab 06/18/19 1619  WBC 9.2  NEUTROABS 6.6  HGB 11.5*  HCT 32.9*  MCV 104.1*  PLT 469   Basic Metabolic Panel: Recent Labs  Lab 06/16/19 1134 06/18/19 1619  NA 122* 122*  K 3.9 3.4*  CL 86* 87*  CO2 26 26  GLUCOSE 117* 128*  BUN 18 19  CREATININE 0.96 0.83  CALCIUM 8.3* 8.1*   GFR: Estimated Creatinine Clearance: 122.3 mL/min (by C-G formula based on SCr of 0.83 mg/dL). Liver Function Tests: Recent Labs  Lab 06/16/19 1134 06/18/19 1619  AST 70* 84*  ALT 32 36  ALKPHOS 82 75  BILITOT 5.9* 6.3*  PROT 6.9 6.8  ALBUMIN 2.3* 2.2*   Recent Labs  Lab 06/18/19 1619  LIPASE 34   Recent Labs  Lab 06/18/19 1619  AMMONIA 24   Coagulation Profile: Recent Labs  Lab 06/16/19 1134 06/18/19 1619  INR 1.6* 1.7*    Cardiac Enzymes: No results for input(s): CKTOTAL, CKMB, CKMBINDEX, TROPONINI in the last 168 hours. BNP (last 3 results) No results for input(s): PROBNP in the last 8760 hours. HbA1C: No results for input(s): HGBA1C in the last 72 hours. CBG: No results for input(s): GLUCAP in the last 168 hours. Lipid Profile: No results for input(s): CHOL, HDL, LDLCALC, TRIG, CHOLHDL, LDLDIRECT in the last 72 hours. Thyroid Function Tests: No results for input(s): TSH, T4TOTAL, FREET4, T3FREE, THYROIDAB in the last 72 hours. Anemia Panel: No results for input(s): VITAMINB12, FOLATE, FERRITIN, TIBC, IRON, RETICCTPCT in the last 72 hours. Urine analysis: No results found for: COLORURINE, APPEARANCEUR, LABSPEC, [place], GLUCOSEU, HGBUR, BILIRUBINUR, KETONESUR, PROTEINUR, UROBILINOGEN, NITRITE, LEUKOCYTESUR  Radiological Exams on Admission: US Paracentesis  Result Date: 06/18/2019 INDICATION: Cirrhosis, ascites EXAM: ULTRASOUND GUIDED DIAGNOSTIC AND  THERAPEUTIC PARACENTESIS MEDICATIONS: None COMPLICATIONS: None immediate PROCEDURE: Informed written consent was obtained from the patient after a discussion of the risks, benefits and alternatives to treatment. A timeout was performed prior to the initiation of the procedure. Initial ultrasound scanning demonstrates a large amount of ascites within the right lower abdominal quadrant. The right lower abdomen was prepped and draped in the usual sterile fashion. 1% lidocaine was used for local anesthesia. Following this, a 5 [place] Yueh catheter was introduced. An ultrasound image was saved for documentation purposes. The paracentesis was performed. The catheter was removed and a dressing was applied. The patient tolerated the procedure well without immediate post procedural complication. Patient received post-procedure intravenous albumin; see nursing notes for details. FINDINGS: A total of approximately 6.2 L of yellow ascitic fluid was removed. Samples were sent  to the laboratory as requested by the clinical team. IMPRESSION: Successful ultrasound-guided paracentesis yielding 6.2 liters of peritoneal fluid. Electronically Signed   By: Ulyses Southward M.D.   On: 06/18/2019 18:38   DG Chest Port 1 View  Result Date: 06/18/2019 CLINICAL DATA:  Vomiting EXAM: PORTABLE CHEST 1 VIEW COMPARISON:  07/07/2012 FINDINGS: Small left-sided pleural effusion or pleural thickening. Mild airspace disease at the left base. Normal heart size. No pneumothorax. IMPRESSION: Small left-sided pleural effusion or pleural thickening with atelectasis or mild pneumonia at the left base. Electronically Signed   By: Jasmine Pang M.D.   On: 06/18/2019 19:30    EKG: Independently reviewed. Vent. rate 105 BPM PR interval * ms QRS duration 100 ms QT/QTc 394/521 ms P-R-T axes 102 102 122 Right and left arm electrode reversal, interpretation assumes no reversal Sinus tachycardia Probable lateral infarct, age indeterminate Prolonged QT interval Baseline wander in lead(s) V3  Assessment/Plan Principal Problem:   Hematemesis Observation/telemetry. Keep NPO. Monitor H&H. Continue Protonix 40 mg IVP Q 12 hr. Continue gentle IV hydration. Consult gastroenterology.  Active Problems:   Hepatic cirrhosis (HCC) MELD Score of 27. 90-day mortality risk is 19.6%. GI evaluation in a.m.    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 Trial of oral vitamin K.    Hyponatremia Hold furosemide. Gentle hydration with NS. Follow-up sodium level.    Hypokalemia Secondary to diuretic use. Currently replacing. Follow-up potassium level.    Prolonged QT interval Correct potassium. Supplemental magnesium. Recheck EKG in the a.m.    Macrocytic anemia Monitor H&H. Check vitamin B12 level. Check folic acid level.    DVT prophylaxis: SCDs. Code Status: Full code. Family Communication: Disposition Plan: Observation for H&H monitoring and GI evaluation. Consults called:  Routine gastroenterology consult. Admission status: Observation/telemetry.  Bobette Mo MD [facility]  If 7PM-7AM, please contact night-coverage [email or web address]  06/18/2019, 8:58 PM   This document was prepared using Dragon voice recognition software and may contain some unintended transcription errors.

## 2019-06-18 NOTE — Telephone Encounter (Signed)
Spoke with pts spouse. Pt vomiting what looks like black tar, weak and is currently trying to drink chicken noodle soup. Pts hiccups were very bad last night and pts spouse states they sounded like pt was going to take his last breath. Pt is aware that when LSL, advised pt to go to the ED if symptoms worsened.

## 2019-06-18 NOTE — Telephone Encounter (Signed)
Patient wife called and said patient is not doing well at all and can hardly walk, is thinking about taking him to the er    Please call

## 2019-06-18 NOTE — ED Triage Notes (Signed)
Pt is having nausea and vomiting since yesterday. Per wife, emesis was very dark. History of cirrhosis. Was scheduled today for a paracentesis, but was unable to receive one today due to emesis. Severe pitting edema to legs. Pt had 5L drawn off abdomen on Monday.

## 2019-06-18 NOTE — Telephone Encounter (Addendum)
Patient cancelled diagnostic para today. Was scheduled to follow up on cell count (trend up on last one but did not meet criteria for SBP at the time).   Now calling in with weakness and UGI bleed. He could have variceal bleed which can be life threatening. He could have SBP.   He needs to get to ER ASAP. Do not eat or drink anything else until he can be evaluated. Call 911 if needed.

## 2019-06-18 NOTE — ED Notes (Signed)
Lab called an amended CBC lab report. Dr. Deretha Emory notified.

## 2019-06-18 NOTE — Procedures (Signed)
PreOperative Dx: Cirrhosis, ascites Postoperative Dx: Cirrhosis, ascites Procedure:   US guided paracentesis Radiologist:  Jaianna Nicoll Anesthesia:  10 ml of1% lidocaine Specimen:  6.2 L of yellow ascitic fluid EBL:   < 1 ml Complications: None  

## 2019-06-19 ENCOUNTER — Telehealth: Payer: Self-pay

## 2019-06-19 DIAGNOSIS — K922 Gastrointestinal hemorrhage, unspecified: Secondary | ICD-10-CM | POA: Diagnosis present

## 2019-06-19 DIAGNOSIS — E871 Hypo-osmolality and hyponatremia: Secondary | ICD-10-CM

## 2019-06-19 DIAGNOSIS — R601 Generalized edema: Secondary | ICD-10-CM

## 2019-06-19 DIAGNOSIS — K920 Hematemesis: Secondary | ICD-10-CM

## 2019-06-19 DIAGNOSIS — K7031 Alcoholic cirrhosis of liver with ascites: Secondary | ICD-10-CM

## 2019-06-19 LAB — COMPREHENSIVE METABOLIC PANEL
ALT: 27 U/L (ref 0–44)
AST: 62 U/L — ABNORMAL HIGH (ref 15–41)
Albumin: 1.8 g/dL — ABNORMAL LOW (ref 3.5–5.0)
Alkaline Phosphatase: 59 U/L (ref 38–126)
Anion gap: 8 (ref 5–15)
BUN: 15 mg/dL (ref 6–20)
CO2: 25 mmol/L (ref 22–32)
Calcium: 7.8 mg/dL — ABNORMAL LOW (ref 8.9–10.3)
Chloride: 91 mmol/L — ABNORMAL LOW (ref 98–111)
Creatinine, Ser: 0.68 mg/dL (ref 0.61–1.24)
GFR calc Af Amer: >60 mL/min (ref >60)
GFR calc non Af Amer: >60 mL/min (ref >60)
Glucose, Bld: 97 mg/dL (ref 70–99)
Potassium: 3.7 mmol/L (ref 3.5–5.1)
Sodium: 124 mmol/L — ABNORMAL LOW (ref 135–145)
Total Bilirubin: 5.3 mg/dL — ABNORMAL HIGH (ref 0.3–1.2)
Total Protein: 5.4 g/dL — ABNORMAL LOW (ref 6.5–8.1)

## 2019-06-19 LAB — CBC
HCT: 27 % — ABNORMAL LOW (ref 39.0–52.0)
Hemoglobin: 9.4 g/dL — ABNORMAL LOW (ref 13.0–17.0)
MCH: 35.9 pg — ABNORMAL HIGH (ref 26.0–34.0)
MCHC: 34.8 g/dL (ref 30.0–36.0)
MCV: 103.1 fL — ABNORMAL HIGH (ref 80.0–100.0)
Platelets: 154 10*3/uL (ref 150–400)
RBC: 2.62 MIL/uL — ABNORMAL LOW (ref 4.22–5.81)
RDW: 13.2 % (ref 11.5–15.5)
WBC: 7.1 10*3/uL (ref 4.0–10.5)
nRBC: 0 % (ref 0.0–0.2)

## 2019-06-19 LAB — FERRITIN: Ferritin: 601 ng/mL — ABNORMAL HIGH (ref 24–336)

## 2019-06-19 LAB — IRON AND TIBC
Iron: 96 ug/dL (ref 45–182)
Saturation Ratios: 77 % — ABNORMAL HIGH (ref 17.9–39.5)
TIBC: 124 ug/dL — ABNORMAL LOW (ref 250–450)
UIBC: 28 ug/dL

## 2019-06-19 LAB — PROTIME-INR
INR: 1.7 — ABNORMAL HIGH (ref 0.8–1.2)
Prothrombin Time: 19.7 seconds — ABNORMAL HIGH (ref 11.4–15.2)

## 2019-06-19 LAB — PREPARE RBC (CROSSMATCH)

## 2019-06-19 LAB — HIV ANTIBODY (ROUTINE TESTING W REFLEX): HIV Screen 4th Generation wRfx: NONREACTIVE

## 2019-06-19 LAB — VITAMIN B12: Vitamin B-12: 917 pg/mL — ABNORMAL HIGH (ref 180–914)

## 2019-06-19 LAB — ABO/RH: ABO/RH(D): A POS

## 2019-06-19 LAB — FOLATE: Folate: 7.3 ng/mL (ref >5.9)

## 2019-06-19 MED ORDER — VITAMIN K1 10 MG/ML IJ SOLN
INTRAMUSCULAR | Status: AC
  Filled 2019-06-19: qty 1

## 2019-06-19 MED ORDER — METOCLOPRAMIDE HCL 5 MG/ML IJ SOLN
5.0000 mg | Freq: Four times a day (QID) | INTRAMUSCULAR | Status: AC
  Administered 2019-06-19 – 2019-06-21 (×6): 5 mg via INTRAVENOUS
  Filled 2019-06-19 (×6): qty 2

## 2019-06-19 MED ORDER — SODIUM CHLORIDE 0.9% IV SOLUTION: Freq: Once | INTRAVENOUS | Status: DC

## 2019-06-19 MED ORDER — SODIUM CHLORIDE 0.9 % IV BOLUS
500.0000 mL | Freq: Once | INTRAVENOUS | Status: AC
  Administered 2019-06-19: 500 mL via INTRAVENOUS

## 2019-06-19 MED ORDER — ALBUMIN HUMAN 25 % IV SOLN
12.5000 g | Freq: Three times a day (TID) | INTRAVENOUS | Status: DC
  Administered 2019-06-19: 12.5 g via INTRAVENOUS
  Filled 2019-06-19 (×2): qty 50

## 2019-06-19 MED ORDER — VITAMIN K1 10 MG/ML IJ SOLN
5.0000 mg | Freq: Once | INTRAVENOUS | Status: AC
  Administered 2019-06-19: 5 mg via INTRAVENOUS
  Filled 2019-06-19: qty 0.5

## 2019-06-19 MED ORDER — FUROSEMIDE 10 MG/ML IJ SOLN
40.0000 mg | Freq: Three times a day (TID) | INTRAMUSCULAR | Status: DC
  Administered 2019-06-19 – 2019-06-20 (×3): 40 mg via INTRAVENOUS
  Filled 2019-06-19 (×4): qty 4

## 2019-06-19 MED ORDER — ALBUMIN HUMAN 25 % IV SOLN
12.5000 g | Freq: Three times a day (TID) | INTRAVENOUS | Status: AC
  Administered 2019-06-20 (×2): 12.5 g via INTRAVENOUS
  Filled 2019-06-19 (×2): qty 50

## 2019-06-19 MED ORDER — POTASSIUM CHLORIDE CRYS ER 20 MEQ PO TBCR
40.0000 meq | EXTENDED_RELEASE_TABLET | Freq: Once | ORAL | Status: AC
  Administered 2019-06-19: 40 meq via ORAL
  Filled 2019-06-19: qty 2

## 2019-06-19 MED ORDER — ALBUMIN HUMAN 25 % IV SOLN
25.0000 g | Freq: Once | INTRAVENOUS | Status: AC
  Administered 2019-06-19: 25 g via INTRAVENOUS
  Filled 2019-06-19: qty 100

## 2019-06-19 NOTE — Progress Notes (Signed)
Patient Demographics:    Alan Phillips, is a 54 y.o. male, DOB - 03-28-1965, VWU:[id number]  Admit date - 06/18/2019   Admitting Physician Alan Pointer Mariea Clonts, MD  Outpatient Primary MD for the patient is Alan Hawking, PA-C  LOS - 0   Chief Complaint  Patient presents with  . Nausea  . Emesis        Subjective:    Alan Phillips today has no fevers, no emesis,  No chest pain,  --- wife at bedside, questions answered-no further emesis -Patient with fatigue, dyspnea on exertion and palpitations with activity  Assessment  & Plan :    Principal Problem:   Hematemesis Active Problems:   Hepatic cirrhosis (HCC)   Hyponatremia   Hypokalemia   Macrocytic anemia   Coagulopathy (HCC)   Prolonged QT interval   Acute GI bleeding  Brief Summary:-  54 y.o. male with medical history significant of depression, psoriasis, recently diagnosed with alcoholic cirrhosis of the liver  admitted on 06/18/2019 with hematemesis and decompensated liver failure -Awaiting EGD for possible UGI bleeding as well as to screen for varices however anesthesiologist would like to improve electrolyte situation prior to endoscopy  A/p 1)Hematemesis--- no further emesis, continue IV Protonix, GI consult appreciated plan for EGD when sodium improves -Patient received vitamin K on 06/19/2019  2)Alcoholic cirrhosis with ascites/anasarca--status post prior stent change on 06/18/2019 with removal of 6.2 L -Continue IV Lasix with albumin -Patient most likely need additional paracentesis prior to discharge home -IV Lasix with albumin as ordered -1.7 vitamin K as above #1 -Currently not encephalopathic -MELD Score of 27, 90-day mortality risk is 19.6%. -He has an appointment at [facility] for initial evaluation on 09/24/19 at 10:00am with Alan. Pollyann Phillips.   3) hyponatremia and hypokalemia--- PTA patient was on Lasix and  Aldactone----okay to give IV Lasix with albumin for #2 above  4) acute on chronic symptomatic macrocytic and hyperchromic anemia due to acute GI blood loss--- -Patient continues to have symptomatic anemia---Patient with fatigue, dyspnea on exertion and palpitations with activity -Baseline hemoglobin usually above 11, admission hemoglobin 10.9 --Repeat hemoglobin 9.4 -B12 and folate are not low, ferritin and serum iron and not low enough -Give multivitamin and folic acid given long history of alcohol abuse and RBC indices --Transfuse as clinically indicated  Disposition/Need for in-Hospital Stay- patient unable to be discharged at this time due to --ascites and anasarca---failed oral diuretics, continue IV Lasix and IV albumin -Patient From: home D/C Place: home Barriers: Not Clinically Stable- -- -Awaiting EGD for possible UGI bleeding as well as to screen for varices however anesthesiologist would like to improve electrolyte situation prior to endoscopy -Continues to require IV Lasix and IV albumin  Code Status : Full  Family Communication:   patient is alert, awake and coherent) -Wife at bedside  Consults  :  Gi  DVT Prophylaxis  :   - SCDs /GI bleed  Lab Results  Component Value Date   PLT 154 06/19/2019    Inpatient Medications  Scheduled Meds: . sodium chloride   Intravenous Once  . furosemide  40 mg Intravenous Q8H  . metoCLOPramide (REGLAN) injection  5 mg Intravenous Q6H  . pantoprazole (PROTONIX) IV  40 mg Intravenous Q12H  .  potassium chloride  40 mEq Oral Once   Continuous Infusions: . albumin human     PRN Meds:.prochlorperazine    Anti-infectives (From admission, onward)   None        Objective:   Vitals:   06/18/19 2330 06/19/19 0356 06/19/19 0609 06/19/19 1434  BP: 117/65 (!) 95/55 101/64 (!) 102/59  Pulse: (!) 105 (!) 109 (!) 110 (!) 102  Resp: 18 16 20 20   Temp: 99.1 F (37.3 C) 99.3 F (37.4 C) 98.2 F (36.8 C) 99.1 F (37.3 C)    TempSrc: Oral Oral Oral   SpO2: 96% 94% 96% 97%  Weight:      Height:        Wt Readings from Last 3 Encounters:  06/18/19 98.6 kg  06/11/19 101.2 kg  05/26/19 95.2 kg     Intake/Output Summary (Last 24 hours) at 06/19/2019 1723 Last data filed at 06/19/2019 0600 Gross per 24 hour  Intake 851.7 ml  Output 550 ml  Net 301.7 ml     Physical Exam  Gen:- Awake Alert,  In no apparent distress  HEENT:- [place].AT, +ve sclera icterus Neck-Supple Neck,No JVD,.  Lungs-  CTAB , fair symmetrical air movement CV- S1, S2 normal, regular  Abd-  +ve B.Sounds, Abd Soft, significant ascites with umbilical hernia,    Extremity/Skin:-2-3+ pitting edema/anasarca, pedal pulses present  Psych-affect is appropriate, oriented x3 Neuro-generalized weakness no new focal deficits, no tremors   Data Review:   Micro Results Recent Results (from the past 240 hour(s))  Culture, body fluid-bottle     Status: None (Preliminary result)   Collection Time: 06/16/19 10:35 AM   Specimen: Fluid  Result Value Ref Range Status   Specimen Description FLUID ASCITIC Alan Phillips  Final   Special Requests BOTTLES DRAWN AEROBIC AND ANAEROBIC 10CC EACH  Final   Culture   Final    NO GROWTH 2 DAYS Performed at [facility], [address] [place], [place] [address]    Report Status PENDING  Incomplete  Gram stain     Status: None   Collection Time: 06/16/19 10:40 AM   Specimen: Ascitic; Body Fluid  Result Value Ref Range Status   Specimen Description ASCITIC  Final   Special Requests NONE  Final   Gram Stain   Final    CYTOSPIN SMEAR WBC PRESENT,BOTH PMN AND MONONUCLEAR NO ORGANISMS SEEN Performed at [facility] Performed at [facility], [address] [place], [place] [address]    Report Status 06/16/2019 FINAL  Final  Culture, body fluid-bottle     Status: None (Preliminary result)   Collection Time: 06/18/19  5:45 PM   Specimen: Ascitic  Result Value Ref Range Status   Specimen Description  ASCITIC  Final   Special Requests   Final    BOTTLES DRAWN AEROBIC AND ANAEROBIC Blood Culture adequate volume Performed at [facility], [address] [place], [place] [address]    Culture PENDING  Incomplete   Report Status PENDING  Incomplete  Gram stain     Status: None   Collection Time: 06/18/19  5:45 PM   Specimen: Ascitic  Result Value Ref Range Status   Specimen Description ASCITIC  Final   Special Requests NONE  Final   Gram Stain   Final    NO ORGANISMS SEEN Performed at [facility] WBC PRESENT,BOTH PMN AND MONONUCLEAR CYTOSPIN SMEAR Performed at [facility], [address] [place], [place] [address]    Report Status 06/18/2019 FINAL  Final  Respiratory Panel by RT PCR (Flu A&B, Covid) - Nasopharyngeal Swab     Status: None   Collection Time: 06/18/19  8:02 PM   Specimen: Nasopharyngeal Swab  Result Value Ref Range Status   SARS Coronavirus 2 by RT PCR NEGATIVE NEGATIVE Final    Comment: (NOTE) SARS-CoV-2 target nucleic acids are NOT DETECTED. The SARS-CoV-2 RNA is generally detectable in upper respiratoy specimens during the acute phase of infection. The lowest concentration of SARS-CoV-2 viral copies this assay can detect is 131 copies/mL. A negative result does not preclude SARS-Cov-2 infection and should not be used as the sole basis for treatment or other patient management decisions. A negative result may occur with  improper specimen collection/handling, submission of specimen other than nasopharyngeal swab, presence of viral mutation(s) within the areas targeted by this assay, and inadequate number of viral copies (<131 copies/mL). A negative result must be combined with clinical observations, patient history, and epidemiological information. The expected result is Negative. Fact Sheet for Patients:  https://www.moore.com/ Fact Sheet for Healthcare Providers:  https://www.young.biz/ This test is not yet ap  proved or cleared by the [place] FDA and  has been authorized for detection and/or diagnosis of SARS-CoV-2 by FDA under an Emergency Use Authorization (EUA). This EUA will remain  in effect (meaning this test can be used) for the duration of the COVID-19 declaration under Section 564(b)(1) of the Act, 21 U.S.C. section 360bbb-3(b)(1), unless the authorization is terminated or revoked sooner.    Influenza A by PCR NEGATIVE NEGATIVE Final   Influenza B by PCR NEGATIVE NEGATIVE Final    Comment: (NOTE) The Xpert Xpress SARS-CoV-2/FLU/RSV assay is intended as an aid in  the diagnosis of influenza from Nasopharyngeal swab specimens and  should not be used as a sole basis for treatment. Nasal washings and  aspirates are unacceptable for Xpert Xpress SARS-CoV-2/FLU/RSV  testing. Fact Sheet for Patients: https://www.moore.com/ Fact Sheet for Healthcare Providers: https://www.young.biz/ This test is not yet approved or cleared by the [place] FDA and  has been authorized for detection and/or diagnosis of SARS-CoV-2 by  FDA under an Emergency Use Authorization (EUA). This EUA will remain  in effect (meaning this test can be used) for the duration of the  Covid-19 declaration under Section 564(b)(1) of the Act, 21  U.S.C. section 360bbb-3(b)(1), unless the authorization is  terminated or revoked. Performed at [facility], [address], [place], [place] [address]     Radiology Reports US Paracentesis  Result Date: 06/18/2019 INDICATION: Cirrhosis, ascites EXAM: ULTRASOUND GUIDED DIAGNOSTIC AND THERAPEUTIC PARACENTESIS MEDICATIONS: None COMPLICATIONS: None immediate PROCEDURE: Informed written consent was obtained from the patient after a discussion of the risks, benefits and alternatives to treatment. A timeout was performed prior to the initiation of the procedure. Initial ultrasound scanning demonstrates a large amount of ascites within the  right lower abdominal quadrant. The right lower abdomen was prepped and draped in the usual sterile fashion. 1% lidocaine was used for local anesthesia. Following this, a 5 [place] Yueh catheter was introduced. An ultrasound image was saved for documentation purposes. The paracentesis was performed. The catheter was removed and a dressing was applied. The patient tolerated the procedure well without immediate post procedural complication. Patient received post-procedure intravenous albumin; see nursing notes for details. FINDINGS: A total of approximately 6.2 L of yellow ascitic fluid was removed. Phillips were sent to the laboratory as requested by the clinical team. IMPRESSION: Successful ultrasound-guided paracentesis yielding 6.2 liters of peritoneal fluid. Electronically  Signed   By: Ulyses Southward M.D.   On: 06/18/2019 18:38   US Paracentesis  Result Date: 06/16/2019 INDICATION: Cirrhosis, ascites EXAM: ULTRASOUND GUIDED DIAGNOSTIC AND THERAPEUTIC PARACENTESIS MEDICATIONS: None COMPLICATIONS: None immediate PROCEDURE: Informed written consent was obtained from the patient after a discussion of the risks, benefits and alternatives to treatment. A timeout was performed prior to the initiation of the procedure. Initial ultrasound scanning demonstrates a large amount of ascites within the right lower abdominal quadrant. The right lower abdomen was prepped and draped in the usual sterile fashion. 1% lidocaine was used for local anesthesia. Following this, a 5 [place] Yueh catheter was introduced. An ultrasound image was saved for documentation purposes. The paracentesis was performed. The catheter was removed and a dressing was applied. The patient tolerated the procedure well without immediate post procedural complication. Patient received post-procedure intravenous albumin; see nursing notes for details. FINDINGS: A total of approximately 5 L of yellow ascitic fluid was removed. Volume of contrast removed was  limited to 5 L at request of patient and referring provider due to patient feeling unwell for 4 days after the previous paracentesis when 11 L was removed. Phillips were sent to the laboratory as requested by the clinical team. IMPRESSION: Successful ultrasound-guided paracentesis yielding 5 liters of peritoneal fluid. Electronically Signed   By: Ulyses Southward M.D.   On: 06/16/2019 11:29   US Paracentesis  Result Date: 05/22/2019 INDICATION: Cirrhosis, ascites EXAM: ULTRASOUND GUIDED DIAGNOSTIC AND THERAPEUTIC PARACENTESIS MEDICATIONS: None COMPLICATIONS: None immediate PROCEDURE: Informed written consent was obtained from the patient after a discussion of the risks, benefits and alternatives to treatment. A timeout was performed prior to the initiation of the procedure. Initial ultrasound scanning demonstrates a large amount of ascites within the right lower abdominal quadrant. The right lower abdomen was prepped and draped in the usual sterile fashion. 1% lidocaine was used for local anesthesia. Following this, a 5 [place] Yueh catheter was introduced. An ultrasound image was saved for documentation purposes. The paracentesis was performed. The catheter was removed and a dressing was applied. The patient tolerated the procedure well without immediate post procedural complication. Patient received post-procedure intravenous albumin; see nursing notes for details. FINDINGS: A total of approximately 11.2 L of yellow ascitic fluid was removed. Phillips were sent to the laboratory as requested by the clinical team. IMPRESSION: Successful ultrasound-guided paracentesis yielding 11.2 liters of peritoneal fluid. Electronically Signed   By: Ulyses Southward M.D.   On: 05/22/2019 10:55   DG Chest Port 1 View  Result Date: 06/18/2019 CLINICAL DATA:  Vomiting EXAM: PORTABLE CHEST 1 VIEW COMPARISON:  07/07/2012 FINDINGS: Small left-sided pleural effusion or pleural thickening. Mild airspace disease at the left base. Normal heart  size. No pneumothorax. IMPRESSION: Small left-sided pleural effusion or pleural thickening with atelectasis or mild pneumonia at the left base. Electronically Signed   By: Jasmine Pang M.D.   On: 06/18/2019 19:30   ECHOCARDIOGRAM COMPLETE  Result Date: 06/02/2019    ECHOCARDIOGRAM REPORT   Patient Name:   MIRANDA GARBER Date of Exam: 06/02/2019 Medical Rec #:  [id number]      Height:       72.0 in Accession #:    [id number]     Weight:       209.8 lb Date of Birth:  Oct 25, 1965      BSA:          2.174 m Patient Age:    53 years       BP:  127/73 mmHg Patient Gender: M              HR:           103 bpm. Exam Location:  [facility] Procedure: 2D Echo, Cardiac Doppler and Color Doppler Indications:    R01.1 (ICD-10-CM) - Murmur, heart  History:        Patient has no prior history of Echocardiogram examinations.                 Anasarca,Cirrhosis of liver.  Sonographer:    Celesta Gentile RCS Referring Phys: [id number] [facility]  Sonographer Comments: Extremely difficult to do subcostal imaging due to the Anasarca and size of abdominal distension. IMPRESSIONS  1. ? Ascites and abnormal echogenicity of liver on subcostal images.  2. Left ventricular ejection fraction, by estimation, is 60 to 65%. The left ventricle has normal function. The left ventricle has no regional wall motion abnormalities. Left ventricular diastolic parameters were normal.  3. Right ventricular systolic function is normal. The right ventricular size is normal.  4. The mitral valve is normal in structure. Trivial mitral valve regurgitation. No evidence of mitral stenosis.  5. The aortic valve is normal in structure. Aortic valve regurgitation is not visualized. No aortic stenosis is present.  6. Shadowing in descending thoracic aorta on supra sternal notch with normal color flow.  7. The inferior vena cava is normal in size with greater than 50% respiratory variability, suggesting right atrial pressure of 3 mmHg. FINDINGS  Left  Ventricle: Left ventricular ejection fraction, by estimation, is 60 to 65%. The left ventricle has normal function. The left ventricle has no regional wall motion abnormalities. The left ventricular internal cavity size was normal in size. There is  no left ventricular hypertrophy. Left ventricular diastolic parameters were normal. Right Ventricle: The right ventricular size is normal. No increase in right ventricular wall thickness. Right ventricular systolic function is normal. Left Atrium: Left atrial size was normal in size. Right Atrium: Right atrial size was normal in size. Pericardium: There is no evidence of pericardial effusion. Mitral Valve: The mitral valve is normal in structure. There is mild thickening of the mitral valve leaflet(s). Normal mobility of the mitral valve leaflets. Trivial mitral valve regurgitation. No evidence of mitral valve stenosis. Tricuspid Valve: The tricuspid valve is normal in structure. Tricuspid valve regurgitation is not demonstrated. No evidence of tricuspid stenosis. Aortic Valve: The aortic valve is normal in structure. Aortic valve regurgitation is not visualized. No aortic stenosis is present. Pulmonic Valve: The pulmonic valve was normal in structure. Pulmonic valve regurgitation is not visualized. No evidence of pulmonic stenosis. Aorta: Shadowing in descending thoracic aorta on supra sternal notch with normal color flow. The aortic root is normal in size and structure. Venous: The inferior vena cava is normal in size with greater than 50% respiratory variability, suggesting right atrial pressure of 3 mmHg. IAS/Shunts: No atrial level shunt detected by color flow Doppler. Additional Comments: ? Ascites and abnormal echogenicity of liver on subcostal images.  LEFT VENTRICLE PLAX 2D LVIDd:         4.97 cm  Diastology LVIDs:         2.79 cm  LV e' lateral:   13.40 cm/s LV PW:         0.94 cm  LV E/e' lateral: 4.7 LV IVS:        0.91 cm  LV e' medial:    11.10 cm/s LVOT  diam:     1.80  cm  LV E/e' medial:  5.7 LV SV:         51 LV SV Index:   23 LVOT Area:     2.54 cm  RIGHT VENTRICLE RV Mid diam:    3.49 cm RV S prime:     20.90 cm/s TAPSE (M-mode): 2.6 cm LEFT ATRIUM             Index LA diam:        3.60 cm 1.66 cm/m LA Vol (A2C):   46.2 ml 21.25 ml/m LA Vol (A4C):   59.7 ml 27.46 ml/m LA Biplane Vol: 55.9 ml 25.71 ml/m  AORTIC VALVE LVOT Vmax:   101.00 cm/s LVOT Vmean:  72.500 cm/s LVOT VTI:    0.199 m  AORTA Ao Root diam: 3.80 cm MITRAL VALVE MV Area (PHT): 5.54 cm    SHUNTS MV Decel Time: 137 msec    Systemic VTI:  0.20 m MV E velocity: 63.20 cm/s  Systemic Diam: 1.80 cm MV A velocity: 58.80 cm/s MV E/A ratio:  1.07 Jenkins Rouge MD Electronically signed by Jenkins Rouge MD Signature Date/Time: 06/02/2019/4:19:37 PM    Final      CBC Recent Labs  Lab 06/18/19 1619 06/18/19 2213 06/19/19 0520  WBC 9.2  --  7.1  HGB 11.5* 10.9* 9.4*  HCT 32.9* 31.7* 27.0*  PLT 218  --  154  MCV 104.1*  --  103.1*  MCH 36.4*  --  35.9*  MCHC 35.0  --  34.8  RDW 13.0  --  13.2  LYMPHSABS 0.9  --   --   MONOABS 1.5*  --   --   EOSABS 0.1  --   --   BASOSABS 0.0  --   --     Chemistries  Recent Labs  Lab 06/16/19 1134 06/18/19 1619 06/19/19 0520  NA 122* 122* 124*  K 3.9 3.4* 3.7  CL 86* 87* 91*  CO2 26 26 25   GLUCOSE 117* 128* 97  BUN 18 19 15   CREATININE 0.96 0.83 0.68  CALCIUM 8.3* 8.1* 7.8*  MG  --  1.9  --   AST 70* 84* 62*  ALT 32 36 27  ALKPHOS 82 75 59  BILITOT 5.9* 6.3* 5.3*   ------------------------------------------------------------------------------------------------------------------ No results for input(s): CHOL, HDL, LDLCALC, TRIG, CHOLHDL, LDLDIRECT in the last 72 hours.  No results found for: HGBA1C ------------------------------------------------------------------------------------------------------------------ No results for input(s): TSH, T4TOTAL, T3FREE, THYROIDAB in the last 72 hours.  Invalid input(s):  FREET3 ------------------------------------------------------------------------------------------------------------------ Recent Labs    06/19/19 0520 06/19/19 0815  VITAMINB12 917*  --   FOLATE 7.3  --   FERRITIN  --  601*  TIBC  --  124*  IRON  --  96    Coagulation profile Recent Labs  Lab 06/16/19 1134 06/18/19 1619 06/19/19 0815  INR 1.6* 1.7* 1.7*    No results for input(s): DDIMER in the last 72 hours.  Cardiac Enzymes No results for input(s): CKMB, TROPONINI, MYOGLOBIN in the last 168 hours.  Invalid input(s): CK ------------------------------------------------------------------------------------------------------------------    Component Value Date/Time   BNP 38.0 06/18/2019 1619     Zakir Henner M.D on 06/19/2019 at 5:23 PM  Go to [email or web address] - for contact info  [facility] - Office  [phone]

## 2019-06-19 NOTE — Progress Notes (Signed)
[facility] night shift.  The staff reports that the patient's 0400 vital signs showed monitor BP 95/55 mmHg with map of 66 and a manual recheck of 98/62 mmHg.  His pulse was ranging between 110-114. His follow-up H&H was 10.9/31.7.  I will given 25 g of albumin and 500 mL of NS bolus.  Sanda Klein, MD

## 2019-06-19 NOTE — Progress Notes (Signed)
**Note De-identified  Obfuscation** EKG complete and placed in patient chart 

## 2019-06-19 NOTE — Telephone Encounter (Signed)
Received fax from [facility], pt is scheduled for appt with Dr. Pollyann Samples 09/24/19 at 10:00am.

## 2019-06-19 NOTE — H&P (View-Only) (Signed)
Referring Provider: Roxan Hockey, MD Primary Care Physician:  Soyla Dryer, PA-C Primary Gastroenterologist:  Garfield Cornea, MD  Reason for Consultation:  Hematemesis, cirrhosis  HPI: Alan Phillips is a 54 y.o. male initially met by our practice 3/21 for new diagnosis of cirrhosis and anasarca in the setting of etoh abuse. Patient quit drinking in 04/2019. Work up includes negative viral markers, ferritin 541 but normal iron/iron sats. He had first LVAP last month and has had total of 3 prior to this admission. Third LVAP with upward trend in fluid WBC/ANC but did not meet criteria for SBP and clinically his presentation was not consistent with SBP. We had plans for outpatient diagnostic abdominal paracentesis yesterday (two days after last tap) to repeat cell count but patient started feeling weak, vomiting black emesis and he was directed to the ER.   In the ED: BNP normal. ETOH <10. Sodium 122, K 3.4, BUN 19, Cre 0.83, alb 2.2, ast 84, alt 36, Tbili 6.3. WBC 9200, H/H 11.5/32.9, MCV 104.1, Plat 218,000, INR 1.7.  Respiratory panel -6.2 L of yellow ascitic fluid removed in radiology with unremarkable cell count.  Cultures pending.  Cultures from earlier in the week pending as well.  Patient's imaging includes chest x-ray yesterday with small left-sided pleural effusion or pleural thickening with atelectasis or mild pneumonia at the left base.  Patient had abdominal ultrasound in February 2021 at outside facility that showed cirrhotic liver, portal vein patent by Doppler, spleen normal in size.  Patient has had abdominal pain which gets better with LVAP. No fever. Has been eating well at home. No etoh since 04/2019. BMs not as frequent as usual which he correlates with significant ascites. We started him on lactulose and he has had some good results. Denies melena, brbpr but cautions that he does not really look hard at his stools. At home he has been on lasix 29m daily and aldactone 104m daily but still with significant lower extremity edema and ascites. He is on 2 gram sodium restricted diet. Limiting fluids to 64 ounces daily.  Over the past 24 hours he started having increased weakness, several episodes of hematemesis.  None since admission.  His pulse has been in the 110s, blood pressure stable in the 110/60 range.  Patient denies NSAID or aspirin use.  [facility] on 09/24/19 at 10:00am with Dr. CaLaurier Nancy   Prior to Admission medications   Medication Sig Start Date End Date Taking? Authorizing Provider  furosemide (LASIX) 40 MG tablet Take 40 mg by mouth daily.   Yes [provider]  omeprazole (PRILOSEC) 20 MG capsule Take 20 mg by mouth daily.   Yes [provider]  spironolactone (ALDACTONE) 100 MG tablet Take 1 tablet (100 mg total) by mouth daily. 05/14/19  Yes LeMahala MenghiniPA-C  Lactulose 20 GM/30ML SOLN Take 30 mLs (20 g total) by mouth in the morning and at bedtime. 06/17/19   LeMahala MenghiniPA-C    Current Facility-Administered Medications  Medication Dose Route Frequency Provider Last Rate Last Admin  . 0.9 %  sodium chloride infusion   Intravenous Continuous OrReubin MilanMD 75 mL/hr at 06/18/19 2309 New Bag at 06/18/19 2309  . pantoprazole (PROTONIX) injection 40 mg  40 mg Intravenous Q12H OrReubin MilanMD   40 mg at 06/19/19 0606  . prochlorperazine (COMPAZINE) injection 10 mg  10 mg Intravenous Q6H PRN OrReubin MilanMD  Allergies as of 06/18/2019 - Review Complete 06/18/2019  Allergen Reaction Noted  . Codeine Hives 05/14/2019    Past Medical History:  Diagnosis Date  . Cirrhosis of liver ([place])   . Depression   . Psoriasis     Past Surgical History:  Procedure Laterality Date  . none      Family History  Problem Relation Age of Onset  . Cirrhosis Paternal Uncle        etoh  . Cancer Paternal Uncle   . Alcohol abuse Paternal  Uncle   . Alzheimer's disease Father   . Heart failure Maternal Uncle     Social History   Socioeconomic History  . Marital status: Married    Spouse name: Not on file  . Number of children: Not on file  . Years of education: Not on file  . Highest education level: Not on file  Occupational History  . Not on file  Tobacco Use  . Smoking status: Former Smoker    Packs/day: 0.50    Years: 42.00    Pack years: 21.00    Types: Cigarettes    Quit date: 12/05/2018    Years since quitting: 0.5  . Smokeless tobacco: Former Systems developer    Quit date: 05/14/1986  . Tobacco comment: quit last year  Substance and Sexual Activity  . Alcohol use: Not Currently    Comment: h/o longtime etoh abuse but quit 04/2019 when he found out he has cirrhosis (05/14/19)  . Drug use: Yes    Types: Marijuana  . Sexual activity: Not on file  Other Topics Concern  . Not on file  Social History Narrative  . Not on file   Social Determinants of Health   Financial Resource Strain:   . Difficulty of Paying Living Expenses:   Food Insecurity:   . Worried About Charity fundraiser in the Last Year:   . Arboriculturist in the Last Year:   Transportation Needs:   . Film/video editor (Medical):   Marland Kitchen Lack of Transportation (Non-Medical):   Physical Activity:   . Days of Exercise per Week:   . Minutes of Exercise per Session:   Stress:   . Feeling of Stress :   Social Connections:   . Frequency of Communication with Friends and Family:   . Frequency of Social Gatherings with Friends and Family:   . Attends Religious Services:   . Active Member of Clubs or Organizations:   . Attends Archivist Meetings:   Marland Kitchen Marital Status:   Intimate Partner Violence:   . Fear of Current or Ex-Partner:   . Emotionally Abused:   Marland Kitchen Physically Abused:   . Sexually Abused:      ROS:  General: Negative for anorexia, weight loss, fever, chills, fatigue,++weakness. Eyes: Negative for vision changes.  ENT:  Negative for hoarseness, difficulty swallowing , nasal congestion. CV: Negative for chest pain, angina, palpitations, dyspnea on exertion,++ peripheral edema.  Respiratory: Negative for dyspnea at rest, dyspnea on exertion, cough, sputum, wheezing.  Difficulty breathing with significant ascites, improves after abdominal tap GI: See history of present illness. GU:  Negative for dysuria, hematuria, urinary incontinence, urinary frequency, nocturnal urination.  MS: Negative for joint pain, low back pain.  Derm: Negative for rash or itching.  Neuro: Negative for weakness, abnormal sensation, seizure, frequent headaches, memory loss, confusion.  Psych: Negative for anxiety, depression, suicidal ideation, hallucinations.  Endo: Negative for unusual weight change.  Heme: Negative for bruising or  bleeding. Allergy: Negative for rash or hives.       Physical Examination: Vital signs in last 24 hours: Temp:  [98.2 F (36.8 C)-99.3 F (37.4 C)] 98.2 F (36.8 C) (04/15 0609) Pulse Rate:  [102-115] 110 (04/15 0609) Resp:  [12-20] 20 (04/15 0609) BP: (95-126)/(55-71) 101/64 (04/15 0609) SpO2:  [94 %-100 %] 96 % (04/15 0609) Weight:  [98.6 kg] 98.6 kg (04/14 1605) Last BM Date: 06/18/19  General: Chronically ill-appearing tanned male in no acute distress.  Wife at bedside.    Head: Normocephalic, atraumatic.   Eyes: Conjunctiva pink, positive icterus. Mouth: Oropharyngeal mucosa moist and pink , no lesions erythema or exudate. Neck: Supple without thyromegaly, masses, or lymphadenopathy.  Lungs: Clear to auscultation bilaterally.  Heart: Regular rate and rhythm, no murmurs rubs or gallops.  Abdomen: Bowel sounds are normal, nontender, distended but soft, no abdominal bruits, no rebound or guarding.  Umbilical hernia reducible Rectal: Not performed Extremities: 3+ pitting edema bilaterally. No clubbing, deformity.  Neuro: Alert and oriented x 4 , grossly normal neurologically.  Skin: Warm and  dry, no rash. + jaundice.   Psych: Alert and cooperative, normal mood and affect.        Intake/Output from previous day: 04/14 0701 - 04/15 0700 In: 851.7 [I.V.:12.1; IV Piggyback:839.7] Out: 550 [Urine:550] Intake/Output this shift: No intake/output data recorded.  Lab Results: CBC Recent Labs    06/18/19 1619 06/18/19 2213 06/19/19 0520  WBC 9.2  --  7.1  HGB 11.5* 10.9* 9.4*  HCT 32.9* 31.7* 27.0*  MCV 104.1*  --  103.1*  PLT 218  --  154   BMET Recent Labs    06/16/19 1134 06/18/19 1619 06/19/19 0520  NA 122* 122* 124*  K 3.9 3.4* 3.7  CL 86* 87* 91*  CO2 26 26 25   GLUCOSE 117* 128* 97  BUN 18 19 15   CREATININE 0.96 0.83 0.68  CALCIUM 8.3* 8.1* 7.8*   LFT Recent Labs    06/16/19 1134 06/18/19 1619 06/19/19 0520  BILITOT 5.9* 6.3* 5.3*  ALKPHOS 82 75 59  AST 70* 84* 62*  ALT 32 36 27  PROT 6.9 6.8 5.4*  ALBUMIN 2.3* 2.2* 1.8*    Lipase Recent Labs    06/18/19 1619  LIPASE 34    PT/INR Recent Labs    06/16/19 1134 06/18/19 1619 06/19/19 0815  LABPROT 19.3* 19.6* 19.7*  INR 1.6* 1.7* 1.7*     Lab Results  Component Value Date   VITAMINB12 917 (H) 06/19/2019   Lab Results  Component Value Date   FOLATE 7.3 06/19/2019     Imaging Studies: US Paracentesis  Result Date: 06/18/2019 INDICATION: Cirrhosis, ascites EXAM: ULTRASOUND GUIDED DIAGNOSTIC AND THERAPEUTIC PARACENTESIS MEDICATIONS: None COMPLICATIONS: None immediate PROCEDURE: Informed written consent was obtained from the patient after a discussion of the risks, benefits and alternatives to treatment. A timeout was performed prior to the initiation of the procedure. Initial ultrasound scanning demonstrates a large amount of ascites within the right lower abdominal quadrant. The right lower abdomen was prepped and draped in the usual sterile fashion. 1% lidocaine was used for local anesthesia. Following this, a 5 [place] Yueh catheter was introduced. An ultrasound image was saved for  documentation purposes. The paracentesis was performed. The catheter was removed and a dressing was applied. The patient tolerated the procedure well without immediate post procedural complication. Patient received post-procedure intravenous albumin; see nursing notes for details. FINDINGS: A total of approximately 6.2 L of yellow ascitic fluid  was removed. Samples were sent to the laboratory as requested by the clinical team. IMPRESSION: Successful ultrasound-guided paracentesis yielding 6.2 liters of peritoneal fluid. Electronically Signed   By: Lavonia Dana M.D.   On: 06/18/2019 18:38   US Paracentesis  Result Date: 06/16/2019 INDICATION: Cirrhosis, ascites EXAM: ULTRASOUND GUIDED DIAGNOSTIC AND THERAPEUTIC PARACENTESIS MEDICATIONS: None COMPLICATIONS: None immediate PROCEDURE: Informed written consent was obtained from the patient after a discussion of the risks, benefits and alternatives to treatment. A timeout was performed prior to the initiation of the procedure. Initial ultrasound scanning demonstrates a large amount of ascites within the right lower abdominal quadrant. The right lower abdomen was prepped and draped in the usual sterile fashion. 1% lidocaine was used for local anesthesia. Following this, a 5 [place] Yueh catheter was introduced. An ultrasound image was saved for documentation purposes. The paracentesis was performed. The catheter was removed and a dressing was applied. The patient tolerated the procedure well without immediate post procedural complication. Patient received post-procedure intravenous albumin; see nursing notes for details. FINDINGS: A total of approximately 5 L of yellow ascitic fluid was removed. Volume of contrast removed was limited to 5 L at request of patient and referring provider due to patient feeling unwell for 4 days after the previous paracentesis when 11 L was removed. Samples were sent to the laboratory as requested by the clinical team. IMPRESSION: Successful  ultrasound-guided paracentesis yielding 5 liters of peritoneal fluid. Electronically Signed   By: Lavonia Dana M.D.   On: 06/16/2019 11:29   US Paracentesis  Result Date: 05/22/2019 INDICATION: Cirrhosis, ascites EXAM: ULTRASOUND GUIDED DIAGNOSTIC AND THERAPEUTIC PARACENTESIS MEDICATIONS: None COMPLICATIONS: None immediate PROCEDURE: Informed written consent was obtained from the patient after a discussion of the risks, benefits and alternatives to treatment. A timeout was performed prior to the initiation of the procedure. Initial ultrasound scanning demonstrates a large amount of ascites within the right lower abdominal quadrant. The right lower abdomen was prepped and draped in the usual sterile fashion. 1% lidocaine was used for local anesthesia. Following this, a 5 [place] Yueh catheter was introduced. An ultrasound image was saved for documentation purposes. The paracentesis was performed. The catheter was removed and a dressing was applied. The patient tolerated the procedure well without immediate post procedural complication. Patient received post-procedure intravenous albumin; see nursing notes for details. FINDINGS: A total of approximately 11.2 L of yellow ascitic fluid was removed. Samples were sent to the laboratory as requested by the clinical team. IMPRESSION: Successful ultrasound-guided paracentesis yielding 11.2 liters of peritoneal fluid. Electronically Signed   By: Lavonia Dana M.D.   On: 05/22/2019 10:55   DG Chest Port 1 View  Result Date: 06/18/2019 CLINICAL DATA:  Vomiting EXAM: PORTABLE CHEST 1 VIEW COMPARISON:  07/07/2012 FINDINGS: Small left-sided pleural effusion or pleural thickening. Mild airspace disease at the left base. Normal heart size. No pneumothorax. IMPRESSION: Small left-sided pleural effusion or pleural thickening with atelectasis or mild pneumonia at the left base. Electronically Signed   By: Donavan Foil M.D.   On: 06/18/2019 19:30   ECHOCARDIOGRAM  COMPLETE  Result Date: 06/02/2019    ECHOCARDIOGRAM REPORT   Patient Name:   Alan Phillips Date of Exam: 06/02/2019 Medical Rec #:  [id number]      Height:       72.0 in Accession #:    [id number]     Weight:       209.8 lb Date of Birth:  06/14/1965      BSA:  2.174 m Patient Age:    28 years       BP:           127/73 mmHg Patient Gender: M              HR:           103 bpm. Exam Location:  [facility] Procedure: 2D Echo, Cardiac Doppler and Color Doppler Indications:    R01.1 (ICD-10-CM) - Murmur, heart  History:        Patient has no prior history of Echocardiogram examinations.                 Anasarca,Cirrhosis of liver.  Sonographer:    Alvino Chapel RCS Referring Phys: [id number] [facility]  Sonographer Comments: Extremely difficult to do subcostal imaging due to the Anasarca and size of abdominal distension. IMPRESSIONS  1. ? Ascites and abnormal echogenicity of liver on subcostal images.  2. Left ventricular ejection fraction, by estimation, is 60 to 65%. The left ventricle has normal function. The left ventricle has no regional wall motion abnormalities. Left ventricular diastolic parameters were normal.  3. Right ventricular systolic function is normal. The right ventricular size is normal.  4. The mitral valve is normal in structure. Trivial mitral valve regurgitation. No evidence of mitral stenosis.  5. The aortic valve is normal in structure. Aortic valve regurgitation is not visualized. No aortic stenosis is present.  6. Shadowing in descending thoracic aorta on supra sternal notch with normal color flow.  7. The inferior vena cava is normal in size with greater than 50% respiratory variability, suggesting right atrial pressure of 3 mmHg. FINDINGS  Left Ventricle: Left ventricular ejection fraction, by estimation, is 60 to 65%. The left ventricle has normal function. The left ventricle has no regional wall motion abnormalities. The left ventricular internal cavity size was normal in size.  There is  no left ventricular hypertrophy. Left ventricular diastolic parameters were normal. Right Ventricle: The right ventricular size is normal. No increase in right ventricular wall thickness. Right ventricular systolic function is normal. Left Atrium: Left atrial size was normal in size. Right Atrium: Right atrial size was normal in size. Pericardium: There is no evidence of pericardial effusion. Mitral Valve: The mitral valve is normal in structure. There is mild thickening of the mitral valve leaflet(s). Normal mobility of the mitral valve leaflets. Trivial mitral valve regurgitation. No evidence of mitral valve stenosis. Tricuspid Valve: The tricuspid valve is normal in structure. Tricuspid valve regurgitation is not demonstrated. No evidence of tricuspid stenosis. Aortic Valve: The aortic valve is normal in structure. Aortic valve regurgitation is not visualized. No aortic stenosis is present. Pulmonic Valve: The pulmonic valve was normal in structure. Pulmonic valve regurgitation is not visualized. No evidence of pulmonic stenosis. Aorta: Shadowing in descending thoracic aorta on supra sternal notch with normal color flow. The aortic root is normal in size and structure. Venous: The inferior vena cava is normal in size with greater than 50% respiratory variability, suggesting right atrial pressure of 3 mmHg. IAS/Shunts: No atrial level shunt detected by color flow Doppler. Additional Comments: ? Ascites and abnormal echogenicity of liver on subcostal images.  LEFT VENTRICLE PLAX 2D LVIDd:         4.97 cm  Diastology LVIDs:         2.79 cm  LV e' lateral:   13.40 cm/s LV PW:         0.94 cm  LV E/e' lateral: 4.7 LV IVS:  0.91 cm  LV e' medial:    11.10 cm/s LVOT diam:     1.80 cm  LV E/e' medial:  5.7 LV SV:         51 LV SV Index:   23 LVOT Area:     2.54 cm  RIGHT VENTRICLE RV Mid diam:    3.49 cm RV S prime:     20.90 cm/s TAPSE (M-mode): 2.6 cm LEFT ATRIUM             Index LA diam:        3.60  cm 1.66 cm/m LA Vol (A2C):   46.2 ml 21.25 ml/m LA Vol (A4C):   59.7 ml 27.46 ml/m LA Biplane Vol: 55.9 ml 25.71 ml/m  AORTIC VALVE LVOT Vmax:   101.00 cm/s LVOT Vmean:  72.500 cm/s LVOT VTI:    0.199 m  AORTA Ao Root diam: 3.80 cm MITRAL VALVE MV Area (PHT): 5.54 cm    SHUNTS MV Decel Time: 137 msec    Systemic VTI:  0.20 m MV E velocity: 63.20 cm/s  Systemic Diam: 1.80 cm MV A velocity: 58.80 cm/s MV E/A ratio:  1.07 Jenkins Rouge MD Electronically signed by Jenkins Rouge MD Signature Date/Time: 06/02/2019/4:19:37 PM    Final   Tyson Dense week]   Impression: 54 year old gentleman with history of decompensated cirrhosis likely due to prior alcohol abuse with significant anasarca which has been difficult to manage as an outpatient, requiring multiple paracenteses who presents with overall significant weakness, hematemesis (coffee-ground emesis) which began yesterday.  Hematemesis: Patient had several episodes of coffee-ground emesis yesterday.  Fairly significant acute weakness.  2 g drop in hemoglobin from 11.5-9.4 over the past 24 hours.  Hemodynamically he has been stable. He denies NSAIDS/ASA. His platelet count has been normal overall, limited imaging although ultrasound with no evidence of splenomegaly.  He was scheduled for esophageal variceal screening in June but upper GI bleeding he would benefit from inpatient endoscopy.  Anasarca: He is failed outpatient diuretics, Lasix 40 mg/Aldactone 100 mg.  He is on a 2 g sodium diet, fluid restrictions.  No alcohol in 2 months.  First paracentesis March 12, 4 total in the past month.  Continues to have significant lower extremity edema as well.  Ultrasound in February, portal vein patent. Consider further imaging.   Cirrhosis: Likely related to alcohol abuse.  Denies alcohol use in 2 months.  Decompensation with anasarca/ascites.  Viral markers negative.  Hepatitis A and B vaccinations pending.  His ferritin was elevated over 500 with normal iron and iron  saturations, unlikely due to hemochromatosis.  Current MELD Na 27. DF 41 with using PT control of 12. HCV Ab negative 04/2019 at PCP, Hep B negative (in Epic).  Initial appointment with [facility] scheduled for 09/24/19 at 10:00am with Dr. Laurier Nancy.  Plan: 1. F/U pending fluid labs.  2. Recommend EGD for UGI bleeding, screen for varices.  I have discussed the risks, alternatives, benefits with regards to but not limited to the risk of reaction to medication, bleeding, infection, perforation and the patient is agreeable to proceed. Written consent to be obtained. 3. Consider albumin/lasix runs to assist with anasarca if electrolytes tolerate. Hold off until after EGD. Discussed with Dr. Denton Brick. 4. Additional dose of vitamin K IV 57m. 5. Continue IV Pantoprazole BID.  6. Consider prednisolone for etoh hepatitis.   We would like to thank you for the opportunity to participate in the care of JAvyukt Cimo  LMagda Paganini  S. [facility] [phone] 4/15/202110:07 AM     LOS: 0 days     Addendum: EGD on hold per anesthesia due to Na of 124. Patient has been notified. Full liquid diet, NPO after Midnight. Reglan 86m IV ATC. Dr. EDenton Brickaware of delay due to low Na.   LLaureen Ochs LBernarda C[facility]RR[facility] [phone] [id number] PM

## 2019-06-19 NOTE — Consult Note (Addendum)
Referring Provider: Roxan Hockey, MD Primary Care Physician:  Soyla Dryer, PA-C Primary Gastroenterologist:  Garfield Cornea, MD  Reason for Consultation:  Hematemesis, cirrhosis  HPI: Alan Phillips is a 54 y.o. male initially met by our practice 3/21 for new diagnosis of cirrhosis and anasarca in the setting of etoh abuse. Patient quit drinking in 04/2019. Work up includes negative viral markers, ferritin 541 but normal iron/iron sats. He had first LVAP last month and has had total of 3 prior to this admission. Third LVAP with upward trend in fluid WBC/ANC but did not meet criteria for SBP and clinically his presentation was not consistent with SBP. We had plans for outpatient diagnostic abdominal paracentesis yesterday (two days after last tap) to repeat cell count but patient started feeling weak, vomiting black emesis and he was directed to the ER.   In the ED: BNP normal. ETOH <10. Sodium 122, K 3.4, BUN 19, Cre 0.83, alb 2.2, ast 84, alt 36, Tbili 6.3. WBC 9200, H/H 11.5/32.9, MCV 104.1, Plat 218,000, INR 1.7.  Respiratory panel -6.2 L of yellow ascitic fluid removed in radiology with unremarkable cell count.  Cultures pending.  Cultures from earlier in the week pending as well.  Patient's imaging includes chest x-ray yesterday with small left-sided pleural effusion or pleural thickening with atelectasis or mild pneumonia at the left base.  Patient had abdominal ultrasound in February 2021 at outside facility that showed cirrhotic liver, portal vein patent by Doppler, spleen normal in size.  Patient has had abdominal pain which gets better with LVAP. No fever. Has been eating well at home. No etoh since 04/2019. BMs not as frequent as usual which he correlates with significant ascites. We started him on lactulose and he has had some good results. Denies melena, brbpr but cautions that he does not really look hard at his stools. At home he has been on lasix 70m daily and aldactone 1087m daily but still with significant lower extremity edema and ascites. He is on 2 gram sodium restricted diet. Limiting fluids to 64 ounces daily.  Over the past 24 hours he started having increased weakness, several episodes of hematemesis.  None since admission.  His pulse has been in the 110s, blood pressure stable in the 110/60 range.  Patient denies NSAID or aspirin use.  [facility] for initial evaluation on 09/24/19 at 10:00am with Dr. CaLaurier Nancy   Prior to Admission medications   Medication Sig Start Date End Date Taking? Authorizing Provider  furosemide (LASIX) 40 MG tablet Take 40 mg by mouth daily.   Yes [provider]  omeprazole (PRILOSEC) 20 MG capsule Take 20 mg by mouth daily.   Yes [provider]  spironolactone (ALDACTONE) 100 MG tablet Take 1 tablet (100 mg total) by mouth daily. 05/14/19  Yes LeMahala MenghiniPA-C  Lactulose 20 GM/30ML SOLN Take 30 mLs (20 g total) by mouth in the morning and at bedtime. 06/17/19   LeMahala MenghiniPA-C    Current Facility-Administered Medications  Medication Dose Route Frequency Provider Last Rate Last Admin  . 0.9 %  sodium chloride infusion   Intravenous Continuous OrReubin MilanMD 75 mL/hr at 06/18/19 2309 New Bag at 06/18/19 2309  . pantoprazole (PROTONIX) injection 40 mg  40 mg Intravenous Q12H OrReubin MilanMD   40 mg at 06/19/19 0606  . prochlorperazine (COMPAZINE) injection 10 mg  10 mg Intravenous Q6H PRN OrReubin MilanMD  Allergies as of 06/18/2019 - Review Complete 06/18/2019  Allergen Reaction Noted  . Codeine Hives 05/14/2019    Past Medical History:  Diagnosis Date  . Cirrhosis of liver ([place])   . Depression   . Psoriasis     Past Surgical History:  Procedure Laterality Date  . none      Family History  Problem Relation Age of Onset  . Cirrhosis Paternal Uncle        etoh  . Cancer Paternal Uncle   . Alcohol abuse Paternal  Uncle   . Alzheimer's disease Father   . Heart failure Maternal Uncle     Social History   Socioeconomic History  . Marital status: Married    Spouse name: Not on file  . Number of children: Not on file  . Years of education: Not on file  . Highest education level: Not on file  Occupational History  . Not on file  Tobacco Use  . Smoking status: Former Smoker    Packs/day: 0.50    Years: 42.00    Pack years: 21.00    Types: Cigarettes    Quit date: 12/05/2018    Years since quitting: 0.5  . Smokeless tobacco: Former Systems developer    Quit date: 05/14/1986  . Tobacco comment: quit last year  Substance and Sexual Activity  . Alcohol use: Not Currently    Comment: h/o longtime etoh abuse but quit 04/2019 when he found out he has cirrhosis (05/14/19)  . Drug use: Yes    Types: Marijuana  . Sexual activity: Not on file  Other Topics Concern  . Not on file  Social History Narrative  . Not on file   Social Determinants of Health   Financial Resource Strain:   . Difficulty of Paying Living Expenses:   Food Insecurity:   . Worried About Charity fundraiser in the Last Year:   . Arboriculturist in the Last Year:   Transportation Needs:   . Film/video editor (Medical):   Marland Kitchen Lack of Transportation (Non-Medical):   Physical Activity:   . Days of Exercise per Week:   . Minutes of Exercise per Session:   Stress:   . Feeling of Stress :   Social Connections:   . Frequency of Communication with Friends and Family:   . Frequency of Social Gatherings with Friends and Family:   . Attends Religious Services:   . Active Member of Clubs or Organizations:   . Attends Archivist Meetings:   Marland Kitchen Marital Status:   Intimate Partner Violence:   . Fear of Current or Ex-Partner:   . Emotionally Abused:   Marland Kitchen Physically Abused:   . Sexually Abused:      ROS:  General: Negative for anorexia, weight loss, fever, chills, fatigue,++weakness. Eyes: Negative for vision changes.  ENT:  Negative for hoarseness, difficulty swallowing , nasal congestion. CV: Negative for chest pain, angina, palpitations, dyspnea on exertion,++ peripheral edema.  Respiratory: Negative for dyspnea at rest, dyspnea on exertion, cough, sputum, wheezing.  Difficulty breathing with significant ascites, improves after abdominal tap GI: See history of present illness. GU:  Negative for dysuria, hematuria, urinary incontinence, urinary frequency, nocturnal urination.  MS: Negative for joint pain, low back pain.  Derm: Negative for rash or itching.  Neuro: Negative for weakness, abnormal sensation, seizure, frequent headaches, memory loss, confusion.  Psych: Negative for anxiety, depression, suicidal ideation, hallucinations.  Endo: Negative for unusual weight change.  Heme: Negative for bruising or  bleeding. Allergy: Negative for rash or hives.       Physical Examination: Vital signs in last 24 hours: Temp:  [98.2 F (36.8 C)-99.3 F (37.4 C)] 98.2 F (36.8 C) (04/15 0609) Pulse Rate:  [102-115] 110 (04/15 0609) Resp:  [12-20] 20 (04/15 0609) BP: (95-126)/(55-71) 101/64 (04/15 0609) SpO2:  [94 %-100 %] 96 % (04/15 0609) Weight:  [98.6 kg] 98.6 kg (04/14 1605) Last BM Date: 06/18/19  General: Chronically ill-appearing tanned male in no acute distress.  Wife at bedside.    Head: Normocephalic, atraumatic.   Eyes: Conjunctiva pink, positive icterus. Mouth: Oropharyngeal mucosa moist and pink , no lesions erythema or exudate. Neck: Supple without thyromegaly, masses, or lymphadenopathy.  Lungs: Clear to auscultation bilaterally.  Heart: Regular rate and rhythm, no murmurs rubs or gallops.  Abdomen: Bowel sounds are normal, nontender, distended but soft, no abdominal bruits, no rebound or guarding.  Umbilical hernia reducible Rectal: Not performed Extremities: 3+ pitting edema bilaterally. No clubbing, deformity.  Neuro: Alert and oriented x 4 , grossly normal neurologically.  Skin: Warm and  dry, no rash. + jaundice.   Psych: Alert and cooperative, normal mood and affect.        Intake/Output from previous day: 04/14 0701 - 04/15 0700 In: 851.7 [I.V.:12.1; IV Piggyback:839.7] Out: 550 [Urine:550] Intake/Output this shift: No intake/output data recorded.  Lab Results: CBC Recent Labs    06/18/19 1619 06/18/19 2213 06/19/19 0520  WBC 9.2  --  7.1  HGB 11.5* 10.9* 9.4*  HCT 32.9* 31.7* 27.0*  MCV 104.1*  --  103.1*  PLT 218  --  154   BMET Recent Labs    06/16/19 1134 06/18/19 1619 06/19/19 0520  NA 122* 122* 124*  K 3.9 3.4* 3.7  CL 86* 87* 91*  CO2 26 26 25   GLUCOSE 117* 128* 97  BUN 18 19 15   CREATININE 0.96 0.83 0.68  CALCIUM 8.3* 8.1* 7.8*   LFT Recent Labs    06/16/19 1134 06/18/19 1619 06/19/19 0520  BILITOT 5.9* 6.3* 5.3*  ALKPHOS 82 75 59  AST 70* 84* 62*  ALT 32 36 27  PROT 6.9 6.8 5.4*  ALBUMIN 2.3* 2.2* 1.8*    Lipase Recent Labs    06/18/19 1619  LIPASE 34    PT/INR Recent Labs    06/16/19 1134 06/18/19 1619 06/19/19 0815  LABPROT 19.3* 19.6* 19.7*  INR 1.6* 1.7* 1.7*     Lab Results  Component Value Date   VITAMINB12 917 (H) 06/19/2019   Lab Results  Component Value Date   FOLATE 7.3 06/19/2019     Imaging Studies: US Paracentesis  Result Date: 06/18/2019 INDICATION: Cirrhosis, ascites EXAM: ULTRASOUND GUIDED DIAGNOSTIC AND THERAPEUTIC PARACENTESIS MEDICATIONS: None COMPLICATIONS: None immediate PROCEDURE: Informed written consent was obtained from the patient after a discussion of the risks, benefits and alternatives to treatment. A timeout was performed prior to the initiation of the procedure. Initial ultrasound scanning demonstrates a large amount of ascites within the right lower abdominal quadrant. The right lower abdomen was prepped and draped in the usual sterile fashion. 1% lidocaine was used for local anesthesia. Following this, a 5 [place] Yueh catheter was introduced. An ultrasound image was saved for  documentation purposes. The paracentesis was performed. The catheter was removed and a dressing was applied. The patient tolerated the procedure well without immediate post procedural complication. Patient received post-procedure intravenous albumin; see nursing notes for details. FINDINGS: A total of approximately 6.2 L of yellow ascitic fluid  was removed. Samples were sent to the laboratory as requested by the clinical team. IMPRESSION: Successful ultrasound-guided paracentesis yielding 6.2 liters of peritoneal fluid. Electronically Signed   By: Lavonia Dana M.D.   On: 06/18/2019 18:38   US Paracentesis  Result Date: 06/16/2019 INDICATION: Cirrhosis, ascites EXAM: ULTRASOUND GUIDED DIAGNOSTIC AND THERAPEUTIC PARACENTESIS MEDICATIONS: None COMPLICATIONS: None immediate PROCEDURE: Informed written consent was obtained from the patient after a discussion of the risks, benefits and alternatives to treatment. A timeout was performed prior to the initiation of the procedure. Initial ultrasound scanning demonstrates a large amount of ascites within the right lower abdominal quadrant. The right lower abdomen was prepped and draped in the usual sterile fashion. 1% lidocaine was used for local anesthesia. Following this, a 5 [place] Yueh catheter was introduced. An ultrasound image was saved for documentation purposes. The paracentesis was performed. The catheter was removed and a dressing was applied. The patient tolerated the procedure well without immediate post procedural complication. Patient received post-procedure intravenous albumin; see nursing notes for details. FINDINGS: A total of approximately 5 L of yellow ascitic fluid was removed. Volume of contrast removed was limited to 5 L at request of patient and referring provider due to patient feeling unwell for 4 days after the previous paracentesis when 11 L was removed. Samples were sent to the laboratory as requested by the clinical team. IMPRESSION: Successful  ultrasound-guided paracentesis yielding 5 liters of peritoneal fluid. Electronically Signed   By: Lavonia Dana M.D.   On: 06/16/2019 11:29   US Paracentesis  Result Date: 05/22/2019 INDICATION: Cirrhosis, ascites EXAM: ULTRASOUND GUIDED DIAGNOSTIC AND THERAPEUTIC PARACENTESIS MEDICATIONS: None COMPLICATIONS: None immediate PROCEDURE: Informed written consent was obtained from the patient after a discussion of the risks, benefits and alternatives to treatment. A timeout was performed prior to the initiation of the procedure. Initial ultrasound scanning demonstrates a large amount of ascites within the right lower abdominal quadrant. The right lower abdomen was prepped and draped in the usual sterile fashion. 1% lidocaine was used for local anesthesia. Following this, a 5 [place] Yueh catheter was introduced. An ultrasound image was saved for documentation purposes. The paracentesis was performed. The catheter was removed and a dressing was applied. The patient tolerated the procedure well without immediate post procedural complication. Patient received post-procedure intravenous albumin; see nursing notes for details. FINDINGS: A total of approximately 11.2 L of yellow ascitic fluid was removed. Samples were sent to the laboratory as requested by the clinical team. IMPRESSION: Successful ultrasound-guided paracentesis yielding 11.2 liters of peritoneal fluid. Electronically Signed   By: Lavonia Dana M.D.   On: 05/22/2019 10:55   DG Chest Port 1 View  Result Date: 06/18/2019 CLINICAL DATA:  Vomiting EXAM: PORTABLE CHEST 1 VIEW COMPARISON:  07/07/2012 FINDINGS: Small left-sided pleural effusion or pleural thickening. Mild airspace disease at the left base. Normal heart size. No pneumothorax. IMPRESSION: Small left-sided pleural effusion or pleural thickening with atelectasis or mild pneumonia at the left base. Electronically Signed   By: Donavan Foil M.D.   On: 06/18/2019 19:30   ECHOCARDIOGRAM  COMPLETE  Result Date: 06/02/2019    ECHOCARDIOGRAM REPORT   Patient Name:   Alan Phillips Date of Exam: 06/02/2019 Medical Rec #:  [id number]      Height:       72.0 in Accession #:    [id number]     Weight:       209.8 lb Date of Birth:  10/10/1965      BSA:  2.174 m Patient Age:    82 years       BP:           127/73 mmHg Patient Gender: M              HR:           103 bpm. Exam Location:  [facility] Procedure: 2D Echo, Cardiac Doppler and Color Doppler Indications:    R01.1 (ICD-10-CM) - Murmur, heart  History:        Patient has no prior history of Echocardiogram examinations.                 Anasarca,Cirrhosis of liver.  Sonographer:    Alvino Chapel RCS Referring Phys: [id number] [facility]  Sonographer Comments: Extremely difficult to do subcostal imaging due to the Anasarca and size of abdominal distension. IMPRESSIONS  1. ? Ascites and abnormal echogenicity of liver on subcostal images.  2. Left ventricular ejection fraction, by estimation, is 60 to 65%. The left ventricle has normal function. The left ventricle has no regional wall motion abnormalities. Left ventricular diastolic parameters were normal.  3. Right ventricular systolic function is normal. The right ventricular size is normal.  4. The mitral valve is normal in structure. Trivial mitral valve regurgitation. No evidence of mitral stenosis.  5. The aortic valve is normal in structure. Aortic valve regurgitation is not visualized. No aortic stenosis is present.  6. Shadowing in descending thoracic aorta on supra sternal notch with normal color flow.  7. The inferior vena cava is normal in size with greater than 50% respiratory variability, suggesting right atrial pressure of 3 mmHg. FINDINGS  Left Ventricle: Left ventricular ejection fraction, by estimation, is 60 to 65%. The left ventricle has normal function. The left ventricle has no regional wall motion abnormalities. The left ventricular internal cavity size was normal in size.  There is  no left ventricular hypertrophy. Left ventricular diastolic parameters were normal. Right Ventricle: The right ventricular size is normal. No increase in right ventricular wall thickness. Right ventricular systolic function is normal. Left Atrium: Left atrial size was normal in size. Right Atrium: Right atrial size was normal in size. Pericardium: There is no evidence of pericardial effusion. Mitral Valve: The mitral valve is normal in structure. There is mild thickening of the mitral valve leaflet(s). Normal mobility of the mitral valve leaflets. Trivial mitral valve regurgitation. No evidence of mitral valve stenosis. Tricuspid Valve: The tricuspid valve is normal in structure. Tricuspid valve regurgitation is not demonstrated. No evidence of tricuspid stenosis. Aortic Valve: The aortic valve is normal in structure. Aortic valve regurgitation is not visualized. No aortic stenosis is present. Pulmonic Valve: The pulmonic valve was normal in structure. Pulmonic valve regurgitation is not visualized. No evidence of pulmonic stenosis. Aorta: Shadowing in descending thoracic aorta on supra sternal notch with normal color flow. The aortic root is normal in size and structure. Venous: The inferior vena cava is normal in size with greater than 50% respiratory variability, suggesting right atrial pressure of 3 mmHg. IAS/Shunts: No atrial level shunt detected by color flow Doppler. Additional Comments: ? Ascites and abnormal echogenicity of liver on subcostal images.  LEFT VENTRICLE PLAX 2D LVIDd:         4.97 cm  Diastology LVIDs:         2.79 cm  LV e' lateral:   13.40 cm/s LV PW:         0.94 cm  LV E/e' lateral: 4.7 LV IVS:  0.91 cm  LV e' medial:    11.10 cm/s LVOT diam:     1.80 cm  LV E/e' medial:  5.7 LV SV:         51 LV SV Index:   23 LVOT Area:     2.54 cm  RIGHT VENTRICLE RV Mid diam:    3.49 cm RV S prime:     20.90 cm/s TAPSE (M-mode): 2.6 cm LEFT ATRIUM             Index LA diam:        3.60  cm 1.66 cm/m LA Vol (A2C):   46.2 ml 21.25 ml/m LA Vol (A4C):   59.7 ml 27.46 ml/m LA Biplane Vol: 55.9 ml 25.71 ml/m  AORTIC VALVE LVOT Vmax:   101.00 cm/s LVOT Vmean:  72.500 cm/s LVOT VTI:    0.199 m  AORTA Ao Root diam: 3.80 cm MITRAL VALVE MV Area (PHT): 5.54 cm    SHUNTS MV Decel Time: 137 msec    Systemic VTI:  0.20 m MV E velocity: 63.20 cm/s  Systemic Diam: 1.80 cm MV A velocity: 58.80 cm/s MV E/A ratio:  1.07 Jenkins Rouge MD Electronically signed by Jenkins Rouge MD Signature Date/Time: 06/02/2019/4:19:37 PM    Final   Tyson Dense week]   Impression: 55 year old gentleman with history of decompensated cirrhosis likely due to prior alcohol abuse with significant anasarca which has been difficult to manage as an outpatient, requiring multiple paracenteses who presents with overall significant weakness, hematemesis (coffee-ground emesis) which began yesterday.  Hematemesis: Patient had several episodes of coffee-ground emesis yesterday.  Fairly significant acute weakness.  2 g drop in hemoglobin from 11.5-9.4 over the past 24 hours.  Hemodynamically he has been stable. He denies NSAIDS/ASA. His platelet count has been normal overall, limited imaging although ultrasound with no evidence of splenomegaly.  He was scheduled for esophageal variceal screening in June but upper GI bleeding he would benefit from inpatient endoscopy.  Anasarca: He is failed outpatient diuretics, Lasix 40 mg/Aldactone 100 mg.  He is on a 2 g sodium diet, fluid restrictions.  No alcohol in 2 months.  First paracentesis March 12, 4 total in the past month.  Continues to have significant lower extremity edema as well.  Ultrasound in February, portal vein patent. Consider further imaging.   Cirrhosis: Likely related to alcohol abuse.  Denies alcohol use in 2 months.  Decompensation with anasarca/ascites.  Viral markers negative.  Hepatitis A and B vaccinations pending.  His ferritin was elevated over 500 with normal iron and iron  saturations, unlikely due to hemochromatosis.  Current MELD Na 27. DF 41 with using PT control of 12. HCV Ab negative 04/2019 at PCP, Hep B negative (in Epic).  Initial appointment with [facility] scheduled for 09/24/19 at 10:00am with Dr. Laurier Nancy.  Plan: 1. F/U pending fluid labs.  2. Recommend EGD for UGI bleeding, screen for varices.  I have discussed the risks, alternatives, benefits with regards to but not limited to the risk of reaction to medication, bleeding, infection, perforation and the patient is agreeable to proceed. Written consent to be obtained. 3. Consider albumin/lasix runs to assist with anasarca if electrolytes tolerate. Hold off until after EGD. Discussed with Dr. Denton Brick. 4. Additional dose of vitamin K IV 18m. 5. Continue IV Pantoprazole BID.  6. Consider prednisolone for etoh hepatitis.   We would like to thank you for the opportunity to participate in the care of JCasson Catena  LMagda Paganini  S. Bernarda Caffey [facility] [phone] 4/15/202110:07 AM     LOS: 0 days     Addendum: EGD on hold per anesthesia due to Na of 124. Patient has been notified. Full liquid diet, NPO after Midnight. Reglan 69m IV ATC. Dr. EDenton Brickaware of delay due to low Na.   LLaureen Ochs LBernarda CaffeyRCleveland Asc [facility] Dba [facility]es[facility] [phone]/15/20212:16 PM

## 2019-06-20 ENCOUNTER — Encounter (HOSPITAL_COMMUNITY)
Admission: EM | Disposition: A | Discharge status: Home / Self Care | Payer: Self-pay | Source: Home / Self Care | Attending: Family Medicine

## 2019-06-20 ENCOUNTER — Encounter (HOSPITAL_COMMUNITY): Payer: Self-pay | Admitting: Family Medicine

## 2019-06-20 ENCOUNTER — Inpatient Hospital Stay (HOSPITAL_COMMUNITY): Payer: Self-pay | Admitting: Anesthesiology

## 2019-06-20 ENCOUNTER — Ambulatory Visit (HOSPITAL_COMMUNITY): Payer: Self-pay

## 2019-06-20 DIAGNOSIS — K766 Portal hypertension: Secondary | ICD-10-CM

## 2019-06-20 DIAGNOSIS — I8510 Secondary esophageal varices without bleeding: Secondary | ICD-10-CM

## 2019-06-20 DIAGNOSIS — K259 Gastric ulcer, unspecified as acute or chronic, without hemorrhage or perforation: Secondary | ICD-10-CM

## 2019-06-20 DIAGNOSIS — K2970 Gastritis, unspecified, without bleeding: Secondary | ICD-10-CM

## 2019-06-20 HISTORY — PX: BIOPSY: SHX5522

## 2019-06-20 HISTORY — PX: ESOPHAGOGASTRODUODENOSCOPY (EGD) WITH PROPOFOL: SHX5813

## 2019-06-20 HISTORY — PX: ESOPHAGEAL BANDING: SHX5518

## 2019-06-20 LAB — COMPREHENSIVE METABOLIC PANEL
ALT: 27 U/L (ref 0–44)
AST: 57 U/L — ABNORMAL HIGH (ref 15–41)
Albumin: 2.3 g/dL — ABNORMAL LOW (ref 3.5–5.0)
Alkaline Phosphatase: 56 U/L (ref 38–126)
Anion gap: 8 (ref 5–15)
BUN: 13 mg/dL (ref 6–20)
CO2: 25 mmol/L (ref 22–32)
Calcium: 7.9 mg/dL — ABNORMAL LOW (ref 8.9–10.3)
Chloride: 93 mmol/L — ABNORMAL LOW (ref 98–111)
Creatinine, Ser: 0.76 mg/dL (ref 0.61–1.24)
GFR calc Af Amer: >60 mL/min (ref >60)
GFR calc non Af Amer: >60 mL/min (ref >60)
Glucose, Bld: 99 mg/dL (ref 70–99)
Potassium: 3.8 mmol/L (ref 3.5–5.1)
Sodium: 126 mmol/L — ABNORMAL LOW (ref 135–145)
Total Bilirubin: 5.9 mg/dL — ABNORMAL HIGH (ref 0.3–1.2)
Total Protein: 5.7 g/dL — ABNORMAL LOW (ref 6.5–8.1)

## 2019-06-20 LAB — CBC WITH DIFFERENTIAL/PLATELET
Abs Immature Granulocytes: 0.04 10*3/uL (ref 0.00–0.07)
Basophils Absolute: 0.1 10*3/uL (ref 0.0–0.1)
Basophils Relative: 1 %
Eosinophils Absolute: 0.1 10*3/uL (ref 0.0–0.5)
Eosinophils Relative: 2 %
HCT: 27.5 % — ABNORMAL LOW (ref 39.0–52.0)
Hemoglobin: 9.5 g/dL — ABNORMAL LOW (ref 13.0–17.0)
Immature Granulocytes: 1 %
Lymphocytes Relative: 17 %
Lymphs Abs: 1.1 10*3/uL (ref 0.7–4.0)
MCH: 37 pg — ABNORMAL HIGH (ref 26.0–34.0)
MCHC: 34.5 g/dL (ref 30.0–36.0)
MCV: 107 fL — ABNORMAL HIGH (ref 80.0–100.0)
Monocytes Absolute: 1.3 10*3/uL — ABNORMAL HIGH (ref 0.1–1.0)
Monocytes Relative: 19 %
Neutro Abs: 4.1 10*3/uL (ref 1.7–7.7)
Neutrophils Relative %: 60 %
Platelets: 144 10*3/uL — ABNORMAL LOW (ref 150–400)
RBC: 2.57 MIL/uL — ABNORMAL LOW (ref 4.22–5.81)
RDW: 13.3 % (ref 11.5–15.5)
WBC: 6.7 10*3/uL (ref 4.0–10.5)
nRBC: 0 % (ref 0.0–0.2)

## 2019-06-20 LAB — BASIC METABOLIC PANEL
Anion gap: 10 (ref 5–15)
BUN: 13 mg/dL (ref 6–20)
CO2: 25 mmol/L (ref 22–32)
Calcium: 8 mg/dL — ABNORMAL LOW (ref 8.9–10.3)
Chloride: 93 mmol/L — ABNORMAL LOW (ref 98–111)
Creatinine, Ser: 0.81 mg/dL (ref 0.61–1.24)
GFR calc Af Amer: >60 mL/min (ref >60)
GFR calc non Af Amer: >60 mL/min (ref >60)
Glucose, Bld: 103 mg/dL — ABNORMAL HIGH (ref 70–99)
Potassium: 3.7 mmol/L (ref 3.5–5.1)
Sodium: 128 mmol/L — ABNORMAL LOW (ref 135–145)

## 2019-06-20 LAB — PROTIME-INR
INR: 1.8 — ABNORMAL HIGH (ref 0.8–1.2)
Prothrombin Time: 20.5 seconds — ABNORMAL HIGH (ref 11.4–15.2)

## 2019-06-20 LAB — PATHOLOGIST SMEAR REVIEW

## 2019-06-20 SURGERY — ESOPHAGOGASTRODUODENOSCOPY (EGD) WITH PROPOFOL
Anesthesia: General

## 2019-06-20 MED ORDER — SODIUM CHLORIDE 0.9 % IV SOLN: INTRAVENOUS | Status: DC

## 2019-06-20 MED ORDER — PHENYLEPHRINE 40 MCG/ML (10ML) SYRINGE FOR IV PUSH (FOR BLOOD PRESSURE SUPPORT)
PREFILLED_SYRINGE | INTRAVENOUS | Status: DC | PRN
  Administered 2019-06-20: 40 ug via INTRAVENOUS
  Administered 2019-06-20: 120 ug via INTRAVENOUS
  Administered 2019-06-20: 160 ug via INTRAVENOUS
  Administered 2019-06-20: 120 ug via INTRAVENOUS

## 2019-06-20 MED ORDER — CHLORHEXIDINE GLUCONATE CLOTH 2 % EX PADS: 6.0000 | MEDICATED_PAD | Freq: Once | CUTANEOUS | Status: DC

## 2019-06-20 MED ORDER — CHLORHEXIDINE GLUCONATE CLOTH 2 % EX PADS
6.0000 | MEDICATED_PAD | Freq: Once | CUTANEOUS | Status: DC
  Administered 2019-06-20: 6 via TOPICAL

## 2019-06-20 MED ORDER — KETAMINE HCL 50 MG/5ML IJ SOSY
PREFILLED_SYRINGE | INTRAMUSCULAR | Status: AC
  Filled 2019-06-20: qty 5

## 2019-06-20 MED ORDER — PROPOFOL 500 MG/50ML IV EMUL
INTRAVENOUS | Status: DC | PRN
  Administered 2019-06-20: 120 ug/kg/min via INTRAVENOUS

## 2019-06-20 MED ORDER — POTASSIUM CHLORIDE CRYS ER 20 MEQ PO TBCR
40.0000 meq | EXTENDED_RELEASE_TABLET | Freq: Once | ORAL | Status: AC
  Administered 2019-06-20: 40 meq via ORAL
  Filled 2019-06-20: qty 2

## 2019-06-20 MED ORDER — FUROSEMIDE 10 MG/ML IJ SOLN
40.0000 mg | Freq: Two times a day (BID) | INTRAMUSCULAR | Status: DC
  Administered 2019-06-20 – 2019-06-21 (×2): 40 mg via INTRAVENOUS
  Filled 2019-06-20: qty 4

## 2019-06-20 MED ORDER — LACTATED RINGERS IV SOLN: INTRAVENOUS | Status: DC | PRN

## 2019-06-20 MED ORDER — LIDOCAINE VISCOUS HCL 2 % MT SOLN
15.0000 mL | Freq: Once | OROMUCOSAL | Status: AC
  Administered 2019-06-20: 15 mL via OROMUCOSAL

## 2019-06-20 MED ORDER — KETAMINE HCL 10 MG/ML IJ SOLN
INTRAMUSCULAR | Status: DC | PRN
  Administered 2019-06-20: 10 mg via INTRAVENOUS
  Administered 2019-06-20: 20 mg via INTRAVENOUS

## 2019-06-20 MED ORDER — VITAMIN K1 10 MG/ML IJ SOLN
10.0000 mg | Freq: Every day | INTRAVENOUS | Status: DC
  Filled 2019-06-20 (×2): qty 1

## 2019-06-20 MED ORDER — GLYCOPYRROLATE PF 0.2 MG/ML IJ SOSY
PREFILLED_SYRINGE | INTRAMUSCULAR | Status: AC
  Filled 2019-06-20: qty 1

## 2019-06-20 MED ORDER — LACTATED RINGERS IV SOLN: Freq: Once | INTRAVENOUS | Status: AC

## 2019-06-20 MED ORDER — LIDOCAINE VISCOUS HCL 2 % MT SOLN
OROMUCOSAL | Status: AC
  Filled 2019-06-20: qty 15

## 2019-06-20 MED ORDER — PROPOFOL 10 MG/ML IV BOLUS
INTRAVENOUS | Status: DC | PRN
  Administered 2019-06-20 (×2): 20 mg via INTRAVENOUS
  Administered 2019-06-20 (×2): 10 mg via INTRAVENOUS
  Administered 2019-06-20: 20 mg via INTRAVENOUS

## 2019-06-20 MED ORDER — LIDOCAINE 2% (20 MG/ML) 5 ML SYRINGE
INTRAMUSCULAR | Status: AC
  Filled 2019-06-20: qty 10

## 2019-06-20 MED ORDER — LIDOCAINE VISCOUS HCL 2 % MT SOLN
5.0000 mL | Freq: Once | OROMUCOSAL | Status: AC
  Administered 2019-06-20: 5 mL via OROMUCOSAL

## 2019-06-20 MED ORDER — PROPOFOL 10 MG/ML IV BOLUS
INTRAVENOUS | Status: AC
  Filled 2019-06-20: qty 20

## 2019-06-20 NOTE — Progress Notes (Signed)
Labs rechecked after hospitalist adjusted fluids. Na now 128 (require 128 per anesthesia). Will plan add on EGD add on this afternoon. Patient remains NPO.   Thank you for allowing [place] to participate in the care of Alan Phillips  Alan Dust, DNP, AGNP-C Adult & Gerontological Nurse Practitioner [facility]

## 2019-06-20 NOTE — Progress Notes (Signed)
Patient Demographics:    Alan Phillips, is a 54 y.o. male, DOB - 1965/08/15, NWG:[id number]  Admit date - 06/18/2019   Admitting Physician Manning Luna Mariea Clonts, MD  Outpatient Primary MD for the patient is Jacquelin Hawking, PA-C  LOS - 1   Chief Complaint  Patient presents with  . Nausea  . Emesis        Subjective:    Alan Phillips today has no fevers, no emesis,  No chest pain,  --- -wife at bedside, patient would like to get a EGD so he can start eating again -Had soft BM  Assessment  & Plan :    Principal Problem:   Hematemesis Active Problems:   Hepatic cirrhosis (HCC)   Hyponatremia   Hypokalemia   Macrocytic anemia   Coagulopathy (HCC)   Prolonged QT interval   Acute GI bleeding  Brief Summary:-  54 y.o. male with medical history significant of depression, psoriasis, recently diagnosed with alcoholic cirrhosis of the liver  admitted on 06/18/2019 with hematemesis and decompensated liver failure , status post EGD on 06/20/2019 -  A/p 1)Hematemesis--- no further emesis,  ---continue IV Protonix, GI consult appreciated  -EGD from 06/20/2019 noted as below -Hemostatic clips x2 was placed during EGD on 06/20/2019 by GI service if no bleeding over the next 24 hours possible discharge home on 06/21/2019  -2)Alcoholic cirrhosis with ascites/anasarca--status post paracentesis on 06/18/2019 with removal of 6.2 L -Continue IV Lasix with albumin -Patient most likely need additional paracentesis  -IV Lasix with albumin as ordered - INR is 1.8, continue vitamin K as ordered -Currently not encephalopathic -MELD Score of 27, 90-day mortality risk is 19.6%. -He has an appointment at [facility] liver transplant center for initial evaluation on 09/24/19 at 10:00am with Dr. Pollyann Samples.   3) hyponatremia and hypokalemia--- PTA patient was on Lasix and Aldactone---patient received IV Lasix with albumin for #2  above -Sodium up to 128 from 122  4) acute on chronic symptomatic macrocytic and hyperchromic anemia due to acute GI blood loss--- -Patient continues to have symptomatic anemia---Patient with fatigue, dyspnea on exertion and palpitations with activity -Baseline hemoglobin usually above 11, admission hemoglobin 10.9 --Repeat hemoglobin 9.5 -B12 and folate are not low, ferritin and serum iron and not low enough -Give multivitamin and folic acid given long history of alcohol abuse and RBC indices --Transfuse as clinically indicated  Disposition/Need for in-Hospital Stay- patient unable to be discharged at this time due to --ascites and anasarca---failed oral diuretics, continue IV Lasix and IV albumin -Patient From: home D/C Place: home Barriers: Not Clinically Stable- -- - -possible discharge home on 06/21/2019 if H&H stable and sodium continues to increase   Code Status : Full  Family Communication:   patient is alert, awake and coherent) -Wife at bedside  Consults  :  Gi  Procedures:- EGD 06/20/2019 -Grade I esophageal varices.                           - Medium-sized hiatal hernia.                           - HEMATEMESIS/MELENA DUE TO MILD ESOPHAGITIS,   Medium-sized hiatal hernia  with a few [place], [place] hypertensive gastropathy, OR MILD Gastritis IN THE SETTING OF COAGULOPATHY  DVT Prophylaxis  :   - SCDs /GI bleed  Lab Results  Component Value Date   PLT 144 (L) 06/20/2019    Inpatient Medications  Scheduled Meds: . sodium chloride   Intravenous Once  . furosemide  40 mg Intravenous Q8H  . metoCLOPramide (REGLAN) injection  5 mg Intravenous Q6H  . pantoprazole (PROTONIX) IV  40 mg Intravenous Q12H   Continuous Infusions: . [START ON 06/21/2019] phytonadione (VITAMIN K) IV     PRN Meds:.prochlorperazine    Anti-infectives (From admission, onward)   None        Objective:   Vitals:   06/20/19 1638 06/20/19 1645 06/20/19 1646 06/20/19 1800  BP:   111/69  95/83  Pulse: (!) 108 (!) 108 (!) 109 100  Resp: 14 13 17 16   Temp:    97.8 F (36.6 C)  TempSrc:    Oral  SpO2: 100% 95% 97% 96%  Weight:      Height:        Wt Readings from Last 3 Encounters:  06/18/19 98.6 kg  06/11/19 101.2 kg  05/26/19 95.2 kg     Intake/Output Summary (Last 24 hours) at 06/20/2019 2020 Last data filed at 06/20/2019 1700 Gross per 24 hour  Intake 1184.34 ml  Output 750 ml  Net 434.34 ml     Physical Exam  Gen:- Awake Alert,  In no apparent distress  HEENT:- [place].AT, +ve sclera icterus Neck-Supple Neck,No JVD,.  Lungs-  CTAB , fair symmetrical air movement CV- S1, S2 normal, regular  Abd-  +ve B.Sounds, Abd Soft, significant ascites with umbilical hernia,    OVFIEPPIR/JJOA:-4-1+ pitting edema/anasarca, pedal pulses present  Psych-affect is appropriate, oriented x3 Neuro-generalized weakness no new focal deficits, no tremors   Data Review:   Micro Results Recent Results (from the past 240 hour(s))  Culture, body fluid-bottle     Status: None (Preliminary result)   Collection Time: 06/16/19 10:35 AM   Specimen: Fluid  Result Value Ref Range Status   Specimen Description FLUID ASCITIC DR Thornton Papas  Final   Special Requests BOTTLES DRAWN AEROBIC AND ANAEROBIC 10CC EACH  Final   Culture   Final    NO GROWTH 4 DAYS Performed at [facility], [address] [place], [place] [address]    Report Status PENDING  Incomplete  Gram stain     Status: None   Collection Time: 06/16/19 10:40 AM   Specimen: Ascitic; Body Fluid  Result Value Ref Range Status   Specimen Description ASCITIC  Final   Special Requests NONE  Final   Gram Stain   Final    CYTOSPIN SMEAR WBC PRESENT,BOTH PMN AND MONONUCLEAR NO ORGANISMS SEEN Performed at [facility] Performed at [facility], [address], [place], [place] [address]    Report Status 06/16/2019 FINAL  Final  Culture, body fluid-bottle     Status: None (Preliminary result)   Collection Time:  06/18/19  5:45 PM   Specimen: Ascitic  Result Value Ref Range Status   Specimen Description ASCITIC  Final   Special Requests   Final    BOTTLES DRAWN AEROBIC AND ANAEROBIC Blood Culture adequate volume   Culture   Final    NO GROWTH 2 DAYS Performed at [facility], [address], [place], [place] [address]    Report Status PENDING  Incomplete  Gram stain     Status: None  Collection Time: 06/18/19  5:45 PM   Specimen: Ascitic  Result Value Ref Range Status   Specimen Description ASCITIC  Final   Special Requests NONE  Final   Gram Stain   Final    NO ORGANISMS SEEN Performed at [facility] WBC PRESENT,BOTH PMN AND MONONUCLEAR CYTOSPIN SMEAR Performed at [facility], [address], [place], [place] [address]    Report Status 06/18/2019 FINAL  Final  Respiratory Panel by RT PCR (Flu A&B, Covid) - Nasopharyngeal Swab     Status: None   Collection Time: 06/18/19  8:02 PM   Specimen: Nasopharyngeal Swab  Result Value Ref Range Status   SARS Coronavirus 2 by RT PCR NEGATIVE NEGATIVE Final    Comment: (NOTE) SARS-CoV-2 target nucleic acids are NOT DETECTED. The SARS-CoV-2 RNA is generally detectable in upper respiratoy specimens during the acute phase of infection. The lowest concentration of SARS-CoV-2 viral copies this assay can detect is 131 copies/mL. A negative result does not preclude SARS-Cov-2 infection and should not be used as the sole basis for treatment or other patient management decisions. A negative result may occur with  improper specimen collection/handling, submission of specimen other than nasopharyngeal swab, presence of viral mutation(s) within the areas targeted by this assay, and inadequate number of viral copies (<131 copies/mL). A negative result must be combined with clinical observations, patient history, and epidemiological information. The expected result is Negative. Fact Sheet for Patients:   https://www.moore.com/ Fact Sheet for Healthcare Providers:  https://www.young.biz/ This test is not yet ap proved or cleared by the [place] FDA and  has been authorized for detection and/or diagnosis of SARS-CoV-2 by FDA under an Emergency Use Authorization (EUA). This EUA will remain  in effect (meaning this test can be used) for the duration of the COVID-19 declaration under Section 564(b)(1) of the Act, 21 U.S.C. section 360bbb-3(b)(1), unless the authorization is terminated or revoked sooner.    Influenza A by PCR NEGATIVE NEGATIVE Final   Influenza B by PCR NEGATIVE NEGATIVE Final    Comment: (NOTE) The Xpert Xpress SARS-CoV-2/FLU/RSV assay is intended as an aid in  the diagnosis of influenza from Nasopharyngeal swab specimens and  should not be used as a sole basis for treatment. Nasal washings and  aspirates are unacceptable for Xpert Xpress SARS-CoV-2/FLU/RSV  testing. Fact Sheet for Patients: https://www.moore.com/ Fact Sheet for Healthcare Providers: https://www.young.biz/ This test is not yet approved or cleared by the [place] FDA and  has been authorized for detection and/or diagnosis of SARS-CoV-2 by  FDA under an Emergency Use Authorization (EUA). This EUA will remain  in effect (meaning this test can be used) for the duration of the  Covid-19 declaration under Section 564(b)(1) of the Act, 21  U.S.C. section 360bbb-3(b)(1), unless the authorization is  terminated or revoked. Performed at [facility], [address] [place], [place] [address]     Radiology Reports US Paracentesis  Result Date: 06/18/2019 INDICATION: Cirrhosis, ascites EXAM: ULTRASOUND GUIDED DIAGNOSTIC AND THERAPEUTIC PARACENTESIS MEDICATIONS: None COMPLICATIONS: None immediate PROCEDURE: Informed written consent was obtained from the patient after a discussion of the risks, benefits and alternatives to  treatment. A timeout was performed prior to the initiation of the procedure. Initial ultrasound scanning demonstrates a large amount of ascites within the right lower abdominal quadrant. The right lower abdomen was prepped and draped in the usual sterile fashion. 1% lidocaine was used for local anesthesia. Following this, a 5 [place] Yueh catheter was introduced. An ultrasound image was  saved for documentation purposes. The paracentesis was performed. The catheter was removed and a dressing was applied. The patient tolerated the procedure well without immediate post procedural complication. Patient received post-procedure intravenous albumin; see nursing notes for details. FINDINGS: A total of approximately 6.2 L of yellow ascitic fluid was removed. Samples were sent to the laboratory as requested by the clinical team. IMPRESSION: Successful ultrasound-guided paracentesis yielding 6.2 liters of peritoneal fluid. Electronically Signed   By: Ulyses Southward M.D.   On: 06/18/2019 18:38   US Paracentesis  Result Date: 06/16/2019 INDICATION: Cirrhosis, ascites EXAM: ULTRASOUND GUIDED DIAGNOSTIC AND THERAPEUTIC PARACENTESIS MEDICATIONS: None COMPLICATIONS: None immediate PROCEDURE: Informed written consent was obtained from the patient after a discussion of the risks, benefits and alternatives to treatment. A timeout was performed prior to the initiation of the procedure. Initial ultrasound scanning demonstrates a large amount of ascites within the right lower abdominal quadrant. The right lower abdomen was prepped and draped in the usual sterile fashion. 1% lidocaine was used for local anesthesia. Following this, a 5 [place] Yueh catheter was introduced. An ultrasound image was saved for documentation purposes. The paracentesis was performed. The catheter was removed and a dressing was applied. The patient tolerated the procedure well without immediate post procedural complication. Patient received post-procedure  intravenous albumin; see nursing notes for details. FINDINGS: A total of approximately 5 L of yellow ascitic fluid was removed. Volume of contrast removed was limited to 5 L at request of patient and referring provider due to patient feeling unwell for 4 days after the previous paracentesis when 11 L was removed. Samples were sent to the laboratory as requested by the clinical team. IMPRESSION: Successful ultrasound-guided paracentesis yielding 5 liters of peritoneal fluid. Electronically Signed   By: Ulyses Southward M.D.   On: 06/16/2019 11:29   US Paracentesis  Result Date: 05/22/2019 INDICATION: Cirrhosis, ascites EXAM: ULTRASOUND GUIDED DIAGNOSTIC AND THERAPEUTIC PARACENTESIS MEDICATIONS: None COMPLICATIONS: None immediate PROCEDURE: Informed written consent was obtained from the patient after a discussion of the risks, benefits and alternatives to treatment. A timeout was performed prior to the initiation of the procedure. Initial ultrasound scanning demonstrates a large amount of ascites within the right lower abdominal quadrant. The right lower abdomen was prepped and draped in the usual sterile fashion. 1% lidocaine was used for local anesthesia. Following this, a 5 [place] Yueh catheter was introduced. An ultrasound image was saved for documentation purposes. The paracentesis was performed. The catheter was removed and a dressing was applied. The patient tolerated the procedure well without immediate post procedural complication. Patient received post-procedure intravenous albumin; see nursing notes for details. FINDINGS: A total of approximately 11.2 L of yellow ascitic fluid was removed. Samples were sent to the laboratory as requested by the clinical team. IMPRESSION: Successful ultrasound-guided paracentesis yielding 11.2 liters of peritoneal fluid. Electronically Signed   By: Ulyses Southward M.D.   On: 05/22/2019 10:55   DG Chest Port 1 View  Result Date: 06/18/2019 CLINICAL DATA:  Vomiting EXAM:  PORTABLE CHEST 1 VIEW COMPARISON:  07/07/2012 FINDINGS: Small left-sided pleural effusion or pleural thickening. Mild airspace disease at the left base. Normal heart size. No pneumothorax. IMPRESSION: Small left-sided pleural effusion or pleural thickening with atelectasis or mild pneumonia at the left base. Electronically Signed   By: Jasmine Pang M.D.   On: 06/18/2019 19:30   ECHOCARDIOGRAM COMPLETE  Result Date: 06/02/2019    ECHOCARDIOGRAM REPORT   Patient Name:   GAYLAND NICOL Date of Exam: 06/02/2019 Medical  Rec #:  [id number]      Height:       72.0 in Accession #:    [id number]     Weight:       209.8 lb Date of Birth:  08-26-1965      BSA:          2.174 m Patient Age:    53 years       BP:           127/73 mmHg Patient Gender: M              HR:           103 bpm. [facility] Procedure: 2D Echo, Cardiac Doppler and Color Doppler Indications:    R01.1 (ICD-10-CM) - Murmur, heart  History:        Patient has no prior history of Echocardiogram examinations.                 Anasarca,Cirrhosis of liver.  Sonographer:    Celesta Gentile RCS Referring Phys: [id number] [facility]  Sonographer Comments: Extremely difficult to do subcostal imaging due to the Anasarca and size of abdominal distension. IMPRESSIONS  1. ? Ascites and abnormal echogenicity of liver on subcostal images.  2. Left ventricular ejection fraction, by estimation, is 60 to 65%. The left ventricle has normal function. The left ventricle has no regional wall motion abnormalities. Left ventricular diastolic parameters were normal.  3. Right ventricular systolic function is normal. The right ventricular size is normal.  4. The mitral valve is normal in structure. Trivial mitral valve regurgitation. No evidence of mitral stenosis.  5. The aortic valve is normal in structure. Aortic valve regurgitation is not visualized. No aortic stenosis is present.  6. Shadowing in descending thoracic aorta on supra sternal notch with normal color  flow.  7. The inferior vena cava is normal in size with greater than 50% respiratory variability, suggesting right atrial pressure of 3 mmHg. FINDINGS  Left Ventricle: Left ventricular ejection fraction, by estimation, is 60 to 65%. The left ventricle has normal function. The left ventricle has no regional wall motion abnormalities. The left ventricular internal cavity size was normal in size. There is  no left ventricular hypertrophy. Left ventricular diastolic parameters were normal. Right Ventricle: The right ventricular size is normal. No increase in right ventricular wall thickness. Right ventricular systolic function is normal. Left Atrium: Left atrial size was normal in size. Right Atrium: Right atrial size was normal in size. Pericardium: There is no evidence of pericardial effusion. Mitral Valve: The mitral valve is normal in structure. There is mild thickening of the mitral valve leaflet(s). Normal mobility of the mitral valve leaflets. Trivial mitral valve regurgitation. No evidence of mitral valve stenosis. Tricuspid Valve: The tricuspid valve is normal in structure. Tricuspid valve regurgitation is not demonstrated. No evidence of tricuspid stenosis. Aortic Valve: The aortic valve is normal in structure. Aortic valve regurgitation is not visualized. No aortic stenosis is present. Pulmonic Valve: The pulmonic valve was normal in structure. Pulmonic valve regurgitation is not visualized. No evidence of pulmonic stenosis. Aorta: Shadowing in descending thoracic aorta on supra sternal notch with normal color flow. The aortic root is normal in size and structure. Venous: The inferior vena cava is normal in size with greater than 50% respiratory variability, suggesting right atrial pressure of 3 mmHg. IAS/Shunts: No atrial level shunt detected by color flow Doppler. Additional Comments: ? Ascites and abnormal echogenicity of liver on subcostal  images.  LEFT VENTRICLE PLAX 2D LVIDd:         4.97 cm  Diastology  LVIDs:         2.79 cm  LV e' lateral:   13.40 cm/s LV PW:         0.94 cm  LV E/e' lateral: 4.7 LV IVS:        0.91 cm  LV e' medial:    11.10 cm/s LVOT diam:     1.80 cm  LV E/e' medial:  5.7 LV SV:         51 LV SV Index:   23 LVOT Area:     2.54 cm  RIGHT VENTRICLE RV Mid diam:    3.49 cm RV S prime:     20.90 cm/s TAPSE (M-mode): 2.6 cm LEFT ATRIUM             Index LA diam:        3.60 cm 1.66 cm/m LA Vol (A2C):   46.2 ml 21.25 ml/m LA Vol (A4C):   59.7 ml 27.46 ml/m LA Biplane Vol: 55.9 ml 25.71 ml/m  AORTIC VALVE LVOT Vmax:   101.00 cm/s LVOT Vmean:  72.500 cm/s LVOT VTI:    0.199 m  AORTA Ao Root diam: 3.80 cm MITRAL VALVE MV Area (PHT): 5.54 cm    SHUNTS MV Decel Time: 137 msec    Systemic VTI:  0.20 m MV E velocity: 63.20 cm/s  Systemic Diam: 1.80 cm MV A velocity: 58.80 cm/s MV E/A ratio:  1.07 Charlton Haws MD Electronically signed by Charlton Haws MD Signature Date/Time: 06/02/2019/4:19:37 PM    Final      CBC Recent Labs  Lab 06/18/19 1619 06/18/19 2213 06/19/19 0520 06/20/19 0531  WBC 9.2  --  7.1 6.7  HGB 11.5* 10.9* 9.4* 9.5*  HCT 32.9* 31.7* 27.0* 27.5*  PLT 218  --  154 144*  MCV 104.1*  --  103.1* 107.0*  MCH 36.4*  --  35.9* 37.0*  MCHC 35.0  --  34.8 34.5  RDW 13.0  --  13.2 13.3  LYMPHSABS 0.9  --   --  1.1  MONOABS 1.5*  --   --  1.3*  EOSABS 0.1  --   --  0.1  BASOSABS 0.0  --   --  0.1    Chemistries  Recent Labs  Lab 06/16/19 1134 06/18/19 1619 06/19/19 0520 06/20/19 0531 06/20/19 1038  NA 122* 122* 124* 126* 128*  K 3.9 3.4* 3.7 3.8 3.7  CL 86* 87* 91* 93* 93*  CO2 GLUCOSE 117* 128* 97 99 103*  BUN CREATININE 0.96 0.83 0.68 0.76 0.81  CALCIUM 8.3* 8.1* 7.8* 7.9* 8.0*  MG  --  1.9  --   --   --   AST 70* 84* 62* 57*  --   ALT 32 36 27 27  --   ALKPHOS 82 75 59 56  --   BILITOT 5.9* 6.3* 5.3* 5.9*  --     ------------------------------------------------------------------------------------------------------------------ No results for input(s): CHOL, HDL, LDLCALC, TRIG, CHOLHDL, LDLDIRECT in the last 72 hours.  No results found for: HGBA1C ------------------------------------------------------------------------------------------------------------------ No results for input(s): TSH, T4TOTAL, T3FREE, THYROIDAB in the last 72 hours.  Invalid input(s): FREET3 ------------------------------------------------------------------------------------------------------------------ Recent Labs    06/19/19 0520 06/19/19 0815  VITAMINB12 917*  --   FOLATE 7.3  --   FERRITIN  --  601*  TIBC  --  124*  IRON  --  96    Coagulation profile Recent Labs  Lab 06/16/19 1134 06/18/19 1619 06/19/19 0815 06/20/19 0531  INR 1.6* 1.7* 1.7* 1.8*    No results for input(s): DDIMER in the last 72 hours.  Cardiac Enzymes No results for input(s): CKMB, TROPONINI, MYOGLOBIN in the last 168 hours.  Invalid input(s): CK ------------------------------------------------------------------------------------------------------------------    Component Value Date/Time   BNP 38.0 06/18/2019 1619     Yamilee Harmes M.D on 06/20/2019 at 8:20 PM  Go to [email or web address] - for contact info  [facility] - Office  [phone]

## 2019-06-20 NOTE — Interval H&P Note (Signed)
History and Physical Interval Note:  DISCUSSED PROCEDURE, BENEFITS, & RISKS: < 1% chance of medication reaction, bleeding, perforation, or ASPIRATION.   06/20/2019 3:29 PM  Alan Phillips  has presented today for surgery, with the diagnosis of cirrhosis, hematemesis.  The various methods of treatment have been discussed with the patient and family. After consideration of risks, benefits and other options for treatment, the patient has consented to  Procedure(s): ESOPHAGOGASTRODUODENOSCOPY (EGD) WITH PROPOFOL (N/A) ESOPHAGEAL BANDING (N/A) as a surgical intervention.  The patient's history has been reviewed, patient examined, no change in status, stable for surgery.  I have reviewed the patient's chart and labs.  Questions were answered to the patient's satisfaction.     [employer]

## 2019-06-20 NOTE — Anesthesia Preprocedure Evaluation (Signed)
Anesthesia Evaluation  Patient identified by MRN, date of birth, ID band Patient awake    Reviewed: Allergy & Precautions, NPO status , Patient's Chart, lab work & pertinent test results  History of Anesthesia Complications Negative for: history of anesthetic complications  Airway Mallampati: II  TM Distance: >3 FB Neck ROM: Full    Dental  (+) Edentulous Upper, Edentulous Lower   Pulmonary former smoker,    Pulmonary exam normal breath sounds clear to auscultation       Cardiovascular Exercise Tolerance: Poor Normal cardiovascular exam Rhythm:Regular Rate:Normal  19-Jun-2019 08:37:54 [facility]-ICU ROUTINE RECORD Normal sinus rhythm Low voltage QRS Prolonged QT Abnormal ECG Confirmed by Nanetta Batty [phone]) on 06/19/2019 10:54:26 AM 1. ? Ascites and abnormal echogenicity of liver on subcostal images.  2. Left ventricular ejection fraction, by estimation, is 60 to 65%. The  left ventricle has normal function. The left ventricle has no regional  wall motion abnormalities. Left ventricular diastolic parameters were  normal.  3. Right ventricular systolic function is normal. The right ventricular  size is normal.  4. The mitral valve is normal in structure. Trivial mitral valve  regurgitation. No evidence of mitral stenosis.  5. The aortic valve is normal in structure. Aortic valve regurgitation is  not visualized. No aortic stenosis is present.  6. Shadowing in descending thoracic aorta on supra sternal notch with  normal color flow.  7. The inferior vena cava is normal in size with greater than 50%  respiratory variability, suggesting right atrial pressure of 3 mmHg.    Neuro/Psych PSYCHIATRIC DISORDERS Depression    GI/Hepatic (+) Cirrhosis   Esophageal Varices and ascites  substance abuse  alcohol use and marijuana use, Upper GI bleeding    Endo/Other    Renal/GU       Musculoskeletal   Abdominal   Peds  Hematology  (+) anemia ,   Anesthesia Other Findings   Reproductive/Obstetrics                             Anesthesia Physical Anesthesia Plan  ASA: IV  Anesthesia Plan: General   Post-op Pain Management:    Induction: Intravenous  PONV Risk Score and Plan:   Airway Management Planned: Nasal Cannula, Natural Airway and Simple Face Mask  Additional Equipment:   Intra-op Plan:   Post-operative Plan: Possible Post-op intubation/ventilation  Informed Consent:   Plan Discussed with: CRNA  Anesthesia Plan Comments: (Possible GA with ETT, postop intubation and ventilation was discussed.)        Anesthesia Quick Evaluation

## 2019-06-20 NOTE — Transfer of Care (Signed)
Immediate Anesthesia Transfer of Care Note  Patient: Luke Falero  Procedure(s) Performed: ESOPHAGOGASTRODUODENOSCOPY (EGD) WITH PROPOFOL (N/A ) ESOPHAGEAL BANDING (N/A ) BIOPSY  Patient Location: PACU  Anesthesia Type:General  Level of Consciousness: awake, alert , oriented and sedated  Airway & Oxygen Therapy: Patient Spontanous Breathing and Patient connected to nasal cannula oxygen  Post-op Assessment: Report given to RN, Post -op Vital signs reviewed and stable and low blood pressure, ephedrine 10 mg iv was given  Post vital signs: Reviewed and stable  Last Vitals:  Vitals Value Taken Time  BP 107/70 06/20/19 1623  Temp    Pulse 102 06/20/19 1624  Resp 16 06/20/19 1624  SpO2 100 % 06/20/19 1624  Vitals shown include unvalidated device data.  Last Pain:  Vitals:   06/20/19 1429  TempSrc:   PainSc: 0-No pain         Complications: No apparent anesthesia complications

## 2019-06-20 NOTE — Anesthesia Postprocedure Evaluation (Signed)
Anesthesia Post Note  Patient: Alan Phillips  Procedure(s) Performed: ESOPHAGOGASTRODUODENOSCOPY (EGD) WITH PROPOFOL (N/A ) ESOPHAGEAL BANDING (N/A ) BIOPSY  Patient location during evaluation: PACU Anesthesia Type: General Level of consciousness: awake and alert, oriented and sedated Pain management: pain level controlled Vital Signs Assessment: post-procedure vital signs reviewed and stable Respiratory status: spontaneous breathing, respiratory function stable and patient connected to nasal cannula oxygen Cardiovascular status: blood pressure returned to baseline Postop Assessment: no apparent nausea or vomiting Anesthetic complications: no     Last Vitals:  Vitals:   06/20/19 1429 06/20/19 1609  BP: 104/68 (P) 107/70  Pulse: (!) 104 (P) 99  Resp: 18   Temp: 36.5 C (P) 36.6 C  SpO2: 97% (P) 99%    Last Pain:  Vitals:   06/20/19 1429  TempSrc:   PainSc: 0-No pain                 Alan Phillips

## 2019-06-20 NOTE — Op Note (Addendum)
[facility] Patient Name: Alan Phillips Procedure Date: 06/20/2019 2:45 PM MRN: [id number] Date of Birth: 27-Dec-1965 Attending MD: Barney Drain MD, MD CSN: [id number] Age: 54 Admit Type: Inpatient Procedure:                Upper GI endoscopy WITH COLD FORCEPS BIOPSY Indications:              Hematemesis, Melena Providers:                Barney Drain MD, MD, Lurline Del, RN, Nelma Rothman,                            Technician Referring MD:             Soyla Dryer PA-C, PA Medicines:                Propofol per Anesthesia Complications:            No immediate complications. Estimated Blood Loss:     Estimated blood loss was minimal. Procedure:                Pre-Anesthesia Assessment:                           - Prior to the procedure, a History and Physical                            was performed, and patient medications and                            allergies were reviewed. The patient's tolerance of                            previous anesthesia was also reviewed. The risks                            and benefits of the procedure and the sedation                            options and risks were discussed with the patient.                            All questions were answered, and informed consent                            was obtained. Prior Anticoagulants: The patient has                            taken no previous anticoagulant or antiplatelet                            agents. ASA Grade Assessment: III - A patient with                            severe systemic disease. After reviewing the risks  and benefits, the patient was deemed in                            satisfactory condition to undergo the procedure.                            After obtaining informed consent, the endoscope was                            passed under direct vision. Throughout the                            procedure, the patient's blood pressure, pulse, and                      oxygen saturations were monitored continuously. The                            GIF-H190 ([id number]) scope was introduced through the                            mouth, and advanced to the second part of duodenum.                            The upper GI endoscopy was accomplished without                            difficulty. The patient tolerated the procedure                            well. Scope In: 3:44:37 PM Scope Out: 3:57:27 PM Total Procedure Duration: 0 hours 12 minutes 50 seconds  Findings:      Grade I varices were found in the lower third of the esophagus.       OCCASIONAL EROSION AT EGJ.      A medium-sized hiatal hernia was present.      A medium-sized hiatal hernia with a few Cameron EROSIONS WERE found.      Mild portal hypertensive gastropathy was found in the cardia and in the       gastric fundus.      Localized mild inflammation characterized by congestion (edema) and       erythema was found in the gastric antrum. Biopsies(2) were taken with a       cold forceps for Helicobacter pylori testing. ACTIVE OOZING AT BIOPSY       SITES. ONLY TWO SPECIMENS OBTAINED FROM THE ANTRUM. Coagulation for       hemostasis of bleeding caused by the procedure using bipolar probe was       unsuccessful. To prevent bleeding after the biopsy, two hemostatic clips       were successfully placed (MR conditional). There was no bleeding at the       end of the procedure.      The examined duodenum was normal. Impression:               - Grade I esophageal varices.                           -  Medium-sized hiatal hernia.                           - HEMATEMESIS/MELENA DUE TO MILD ESOPHAGITIS,                            Medium-sized hiatal hernia with a few Cameron                            EROSIONS, Portal hypertensive gastropathy, OR MILD                            Gastritis IN THE SETTING OF COAGULOPATHY Moderate Sedation:      Per Anesthesia Care Recommendation:            - Return patient to hospital ward for ongoing care.                           - Soft diet.                           - Continue present medications. ADD ROCEPHIN Q24                            HRS. PT HAS QT PROLONGATION. CONTINUE REGLAN AND                            PROTONIX IV. VITAMIN K 10 MG IV QD FOR 3 DAYS.                           - Await pathology results.                           - Repeat upper endoscopy 2-3 YEARS FOR VARICEAL                            SCREENING for surveillance.                           - Return to GI office in 6 months. Procedure Code(s):        --- Professional ---                           [phone], Esophagogastroduodenoscopy, flexible,                            transoral; with biopsy, single or multiple Diagnosis Code(s):        --- Professional ---                           I85.00, Esophageal varices without bleeding                           K44.9, Diaphragmatic hernia without obstruction or  gangrene                           K25.9, Gastric ulcer, unspecified as acute or                            chronic, without hemorrhage or perforation                           K76.6, Portal hypertension                           K31.89, Other diseases of stomach and duodenum                           K29.70, Gastritis, unspecified, without bleeding                           K92.0, Hematemesis                           K92.1, Melena (includes Hematochezia) CPT copyright 2019 [employer]. All rights reserved. The codes documented in this report are preliminary and upon coder review may  be revised to meet current compliance requirements. Jonette Eva, MD Jonette Eva MD, MD 06/20/2019 4:26:25 PM This report has been signed electronically. Number of Addenda: 0

## 2019-06-21 LAB — COMPREHENSIVE METABOLIC PANEL
ALT: 28 U/L (ref 0–44)
AST: 55 U/L — ABNORMAL HIGH (ref 15–41)
Albumin: 2.4 g/dL — ABNORMAL LOW (ref 3.5–5.0)
Alkaline Phosphatase: 64 U/L (ref 38–126)
Anion gap: 8 (ref 5–15)
BUN: 13 mg/dL (ref 6–20)
CO2: 26 mmol/L (ref 22–32)
Calcium: 8.1 mg/dL — ABNORMAL LOW (ref 8.9–10.3)
Chloride: 93 mmol/L — ABNORMAL LOW (ref 98–111)
Creatinine, Ser: 0.83 mg/dL (ref 0.61–1.24)
GFR calc Af Amer: >60 mL/min (ref >60)
GFR calc non Af Amer: >60 mL/min (ref >60)
Glucose, Bld: 107 mg/dL — ABNORMAL HIGH (ref 70–99)
Potassium: 4.1 mmol/L (ref 3.5–5.1)
Sodium: 127 mmol/L — ABNORMAL LOW (ref 135–145)
Total Bilirubin: 6.2 mg/dL — ABNORMAL HIGH (ref 0.3–1.2)
Total Protein: 6 g/dL — ABNORMAL LOW (ref 6.5–8.1)

## 2019-06-21 LAB — CULTURE, BODY FLUID W GRAM STAIN -BOTTLE: Culture: NO GROWTH

## 2019-06-21 LAB — CBC
HCT: 30.7 % — ABNORMAL LOW (ref 39.0–52.0)
Hemoglobin: 10.4 g/dL — ABNORMAL LOW (ref 13.0–17.0)
MCH: 36.5 pg — ABNORMAL HIGH (ref 26.0–34.0)
MCHC: 33.9 g/dL (ref 30.0–36.0)
MCV: 107.7 fL — ABNORMAL HIGH (ref 80.0–100.0)
Platelets: 175 10*3/uL (ref 150–400)
RBC: 2.85 MIL/uL — ABNORMAL LOW (ref 4.22–5.81)
RDW: 13.6 % (ref 11.5–15.5)
WBC: 8.9 10*3/uL (ref 4.0–10.5)
nRBC: 0 % (ref 0.0–0.2)

## 2019-06-21 MED ORDER — SPIRONOLACTONE 100 MG PO TABS: 100.0000 mg | ORAL_TABLET | Freq: Every day | ORAL | 5 refills | Status: DC

## 2019-06-21 MED ORDER — VITAMIN K1 10 MG/ML IJ SOLN
10.0000 mg | Freq: Every day | INTRAVENOUS | Status: DC
  Filled 2019-06-21 (×2): qty 1

## 2019-06-21 MED ORDER — PANTOPRAZOLE SODIUM 40 MG PO TBEC: 40.0000 mg | DELAYED_RELEASE_TABLET | Freq: Two times a day (BID) | ORAL | 1 refills | Status: DC

## 2019-06-21 NOTE — Plan of Care (Signed)
  Problem: Education: Goal: Knowledge of General Education information will improve Description: Including pain rating scale, medication(s)/side effects and non-pharmacologic comfort measures Outcome: Completed/Met   Problem: Health Behavior/Discharge Planning: Goal: Ability to manage health-related needs will improve Outcome: Completed/Met   Problem: Clinical Measurements: Goal: Ability to maintain clinical measurements within normal limits will improve Outcome: Completed/Met Goal: Will remain free from infection Outcome: Completed/Met Goal: Diagnostic test results will improve Outcome: Completed/Met Goal: Respiratory complications will improve Outcome: Completed/Met Goal: Cardiovascular complication will be avoided Outcome: Completed/Met   Problem: Activity: Goal: Risk for activity intolerance will decrease Outcome: Completed/Met   Problem: Nutrition: Goal: Adequate nutrition will be maintained Outcome: Completed/Met   Problem: Coping: Goal: Level of anxiety will decrease Outcome: Completed/Met   Problem: Elimination: Goal: Will not experience complications related to bowel motility Outcome: Completed/Met Goal: Will not experience complications related to urinary retention Outcome: Completed/Met   Problem: Pain Managment: Goal: General experience of comfort will improve Outcome: Completed/Met   Problem: Safety: Goal: Ability to remain free from injury will improve Outcome: Completed/Met   Problem: Skin Integrity: Goal: Risk for impaired skin integrity will decrease Outcome: Completed/Met   Problem: Education: Goal: Required Educational Video(s) Outcome: Completed/Met   Problem: Clinical Measurements: Goal: Postoperative complications will be avoided or minimized Outcome: Completed/Met   Problem: Skin Integrity: Goal: Demonstration of wound healing without infection will improve Outcome: Completed/Met

## 2019-06-21 NOTE — Discharge Summary (Signed)
Physician Discharge Summary  Alan Phillips PPI:[id number] DOB: 1965-08-26 DOA: 06/18/2019  PCP: Soyla Dryer, PA-C  Admit date: 06/18/2019 Discharge date: 06/21/2019  Admitted From: home Disposition:  home  Recommendations for Outpatient Follow-up:  1. Follow up with PCP in 1-2 weeks 2. Please obtain BMP/CBC in one week 3. Follow up with GI in 4-6 weeks 4. Paracentesis will be arranged by GI for next week 5. BMP on 4/22 per GI  Discharge Condition: stable CODE STATUS:full code Diet recommendation: heart healthy  Brief/Interim Summary: 54 y.o.malewith medical history significant ofdepression, psoriasis, recently diagnosed with alcoholic cirrhosis of the liver admitted on 06/18/2019 with hematemesis and decompensated liver failure , status post EGD on 06/20/2019   Discharge Diagnoses:  Principal Problem:   Hematemesis Active Problems:   Hepatic cirrhosis (HCC)   Hyponatremia   Hypokalemia   Macrocytic anemia   Coagulopathy (HCC)   Prolonged QT interval   Acute GI bleeding  1. Hematemesis.  EGD done on 4/16 showed grade 1 esophageal varices.  Hematemesis was felt to be due to mild esophagitis, right-sided hernia with few Cameron erosions, portal hypertensive gastropathy, or mild gastritis in the setting of coagulopathy.  He was treated with intravenous Protonix.  He had 2 hemostatic clips placed during EGD.  No signs of persistent bleeding. 2. Alcoholic cirrhosis with ascites/anasarca.  Status post paracentesis on 4/14 with removal of 6.2 L of fluid.  He was treated with intravenous Lasix and albumin.  Overall volume status is improving.  He will be continued on Aldactone as an outpatient.  Per GI, will hold Lasix.  He will have a repeat basic metabolic panel on 0/63.  INR is currently 1.8.  He is not encephalopathic.  Meld score 27, 90-day mortality risk of 19.6%.  He has an appointment at [facility] transplant center for initial evaluation on 09/24/2019 at 10 AM with  Dr. Laurier Nancy.  He will be scheduled for outpatient paracentesis. 3. Hyponatremia hypokalemia.  Sodium improved to 128 from 122 on admission.  Potassium is also been corrected. 4. Acute on chronic microcytic and hypochromic anemia due to acute GI blood loss.  Hemoglobin was followed through his hospital stay.  He did not require transfusion.  Discharge Instructions  Discharge Instructions    Diet - low sodium heart healthy   Complete by: As directed    Increase activity slowly   Complete by: As directed      Allergies as of 06/21/2019      Reactions   Codeine Hives      Medication List    STOP taking these medications   furosemide 40 MG tablet Commonly known as: LASIX   omeprazole 20 MG capsule Commonly known as: PRILOSEC     TAKE these medications   Lactulose 20 GM/30ML Soln Take 30 mLs (20 g total) by mouth in the morning and at bedtime.   pantoprazole 40 MG tablet Commonly known as: Protonix Take 1 tablet (40 mg total) by mouth 2 (two) times daily.   spironolactone 100 MG tablet Commonly known as: Aldactone Take 1 tablet (100 mg total) by mouth daily.      Follow-up Information    Fields, Marga Melnick, MD Follow up.   Specialty: Gastroenterology Why: they will contact you for a follow up appointment they will also schedule paracentesis next week they will also arrange for blood work next week  Contact information: [address] [place] [address] [phone]          Allergies  Allergen Reactions  . Codeine Hives    Consultations:  Gastroenterology   Procedures/Studies: US Paracentesis  Result Date: 06/18/2019 INDICATION: Cirrhosis, ascites EXAM: ULTRASOUND GUIDED DIAGNOSTIC AND THERAPEUTIC PARACENTESIS MEDICATIONS: None COMPLICATIONS: None immediate PROCEDURE: Informed written consent was obtained from the patient after a discussion of the risks, benefits and alternatives to treatment. A timeout was performed prior to the initiation of the  procedure. Initial ultrasound scanning demonstrates a large amount of ascites within the right lower abdominal quadrant. The right lower abdomen was prepped and draped in the usual sterile fashion. 1% lidocaine was used for local anesthesia. Following this, a 5 [place] Yueh catheter was introduced. An ultrasound image was saved for documentation purposes. The paracentesis was performed. The catheter was removed and a dressing was applied. The patient tolerated the procedure well without immediate post procedural complication. Patient received post-procedure intravenous albumin; see nursing notes for details. FINDINGS: A total of approximately 6.2 L of yellow ascitic fluid was removed. Samples were sent to the laboratory as requested by the clinical team. IMPRESSION: Successful ultrasound-guided paracentesis yielding 6.2 liters of peritoneal fluid. Electronically Signed   By: Ulyses Southward M.D.   On: 06/18/2019 18:38   US Paracentesis  Result Date: 06/16/2019 INDICATION: Cirrhosis, ascites EXAM: ULTRASOUND GUIDED DIAGNOSTIC AND THERAPEUTIC PARACENTESIS MEDICATIONS: None COMPLICATIONS: None immediate PROCEDURE: Informed written consent was obtained from the patient after a discussion of the risks, benefits and alternatives to treatment. A timeout was performed prior to the initiation of the procedure. Initial ultrasound scanning demonstrates a large amount of ascites within the right lower abdominal quadrant. The right lower abdomen was prepped and draped in the usual sterile fashion. 1% lidocaine was used for local anesthesia. Following this, a 5 [place] Yueh catheter was introduced. An ultrasound image was saved for documentation purposes. The paracentesis was performed. The catheter was removed and a dressing was applied. The patient tolerated the procedure well without immediate post procedural complication. Patient received post-procedure intravenous albumin; see nursing notes for details. FINDINGS: A total of  approximately 5 L of yellow ascitic fluid was removed. Volume of contrast removed was limited to 5 L at request of patient and referring provider due to patient feeling unwell for 4 days after the previous paracentesis when 11 L was removed. Samples were sent to the laboratory as requested by the clinical team. IMPRESSION: Successful ultrasound-guided paracentesis yielding 5 liters of peritoneal fluid. Electronically Signed   By: Ulyses Southward M.D.   On: 06/16/2019 11:29   DG Chest Port 1 View  Result Date: 06/18/2019 CLINICAL DATA:  Vomiting EXAM: PORTABLE CHEST 1 VIEW COMPARISON:  07/07/2012 FINDINGS: Small left-sided pleural effusion or pleural thickening. Mild airspace disease at the left base. Normal heart size. No pneumothorax. IMPRESSION: Small left-sided pleural effusion or pleural thickening with atelectasis or mild pneumonia at the left base. Electronically Signed   By: Jasmine Pang M.D.   On: 06/18/2019 19:30   ECHOCARDIOGRAM COMPLETE  Result Date: 06/02/2019    ECHOCARDIOGRAM REPORT   Patient Name:   ODILON CASS Date of Exam: 06/02/2019 Medical Rec #:  [id number]      Height:       72.0 in Accession #:    [id number]     Weight:       209.8 lb Date of Birth:  April 11, 1965      BSA:          2.174 m Patient Age:    53 years       BP:  127/73 mmHg Patient Gender: M              HR:           103 bpm. [facility] Procedure: 2D Echo, Cardiac Doppler and Color Doppler Indications:    R01.1 (ICD-10-CM) - Murmur, heart  History:        Patient has no prior history of Echocardiogram examinations.                 Anasarca,Cirrhosis of liver.  Sonographer:    Celesta Gentile RCS Referring Phys: [id number] [facility]  Sonographer Comments: Extremely difficult to do subcostal imaging due to the Anasarca and size of abdominal distension. IMPRESSIONS  1. ? Ascites and abnormal echogenicity of liver on subcostal images.  2. Left ventricular ejection fraction, by estimation, is 60 to 65%. The  left ventricle has normal function. The left ventricle has no regional wall motion abnormalities. Left ventricular diastolic parameters were normal.  3. Right ventricular systolic function is normal. The right ventricular size is normal.  4. The mitral valve is normal in structure. Trivial mitral valve regurgitation. No evidence of mitral stenosis.  5. The aortic valve is normal in structure. Aortic valve regurgitation is not visualized. No aortic stenosis is present.  6. Shadowing in descending thoracic aorta on supra sternal notch with normal color flow.  7. The inferior vena cava is normal in size with greater than 50% respiratory variability, suggesting right atrial pressure of 3 mmHg. FINDINGS  Left Ventricle: Left ventricular ejection fraction, by estimation, is 60 to 65%. The left ventricle has normal function. The left ventricle has no regional wall motion abnormalities. The left ventricular internal cavity size was normal in size. There is  no left ventricular hypertrophy. Left ventricular diastolic parameters were normal. Right Ventricle: The right ventricular size is normal. No increase in right ventricular wall thickness. Right ventricular systolic function is normal. Left Atrium: Left atrial size was normal in size. Right Atrium: Right atrial size was normal in size. Pericardium: There is no evidence of pericardial effusion. Mitral Valve: The mitral valve is normal in structure. There is mild thickening of the mitral valve leaflet(s). Normal mobility of the mitral valve leaflets. Trivial mitral valve regurgitation. No evidence of mitral valve stenosis. Tricuspid Valve: The tricuspid valve is normal in structure. Tricuspid valve regurgitation is not demonstrated. No evidence of tricuspid stenosis. Aortic Valve: The aortic valve is normal in structure. Aortic valve regurgitation is not visualized. No aortic stenosis is present. Pulmonic Valve: The pulmonic valve was normal in structure. Pulmonic valve  regurgitation is not visualized. No evidence of pulmonic stenosis. Aorta: Shadowing in descending thoracic aorta on supra sternal notch with normal color flow. The aortic root is normal in size and structure. Venous: The inferior vena cava is normal in size with greater than 50% respiratory variability, suggesting right atrial pressure of 3 mmHg. IAS/Shunts: No atrial level shunt detected by color flow Doppler. Additional Comments: ? Ascites and abnormal echogenicity of liver on subcostal images.  LEFT VENTRICLE PLAX 2D LVIDd:         4.97 cm  Diastology LVIDs:         2.79 cm  LV e' lateral:   13.40 cm/s LV PW:         0.94 cm  LV E/e' lateral: 4.7 LV IVS:        0.91 cm  LV e' medial:    11.10 cm/s LVOT diam:     1.80  cm  LV E/e' medial:  5.7 LV SV:         51 LV SV Index:   23 LVOT Area:     2.54 cm  RIGHT VENTRICLE RV Mid diam:    3.49 cm RV S prime:     20.90 cm/s TAPSE (M-mode): 2.6 cm LEFT ATRIUM             Index LA diam:        3.60 cm 1.66 cm/m LA Vol (A2C):   46.2 ml 21.25 ml/m LA Vol (A4C):   59.7 ml 27.46 ml/m LA Biplane Vol: 55.9 ml 25.71 ml/m  AORTIC VALVE LVOT Vmax:   101.00 cm/s LVOT Vmean:  72.500 cm/s LVOT VTI:    0.199 m  AORTA Ao Root diam: 3.80 cm MITRAL VALVE MV Area (PHT): 5.54 cm    SHUNTS MV Decel Time: 137 msec    Systemic VTI:  0.20 m MV E velocity: 63.20 cm/s  Systemic Diam: 1.80 cm MV A velocity: 58.80 cm/s MV E/A ratio:  1.07 Charlton HawsPeter Nishan MD Electronically signed by Charlton HawsPeter Nishan MD Signature Date/Time: 06/02/2019/4:19:37 PM    Final        Subjective: No abdominal pain.  No shortness of breath.  No vomiting.  Discharge Exam: Vitals:   06/20/19 2105 06/21/19 0503 06/21/19 0843 06/21/19 1010  BP: 104/67 99/61  106/83  Pulse: 91 (!) 101  87  Resp: 16 18    Temp: 97.9 F (36.6 C) 98.7 F (37.1 C)    TempSrc: Oral Oral    SpO2: 98% 97% 94%   Weight:      Height:        General: Pt is alert, awake, not in acute distress Cardiovascular: RRR, S1/S2 +, no rubs, no  gallops Respiratory: CTA bilaterally, no wheezing, no rhonchi Abdominal: Soft, NT, distended, bowel sounds + Extremities: 2+ edema, no cyanosis    The results of significant diagnostics from this hospitalization (including imaging, microbiology, ancillary and laboratory) are listed below for reference.     Microbiology: Recent Results (from the past 240 hour(s))  Culture, body fluid-bottle     Status: None   Collection Time: 06/16/19 10:35 AM   Specimen: Fluid  Result Value Ref Range Status   Specimen Description FLUID ASCITIC DR Tyron RussellBOLES  Final   Special Requests BOTTLES DRAWN AEROBIC AND ANAEROBIC 10CC EACH  Final   Culture   Final    NO GROWTH 5 DAYS Performed at [facility], [address] [place], [place] [address]    Report Status 06/21/2019 FINAL  Final  Gram stain     Status: None   Collection Time: 06/16/19 10:40 AM   Specimen: Ascitic; Body Fluid  Result Value Ref Range Status   Specimen Description ASCITIC  Final   Special Requests NONE  Final   Gram Stain   Final    CYTOSPIN SMEAR WBC PRESENT,BOTH PMN AND MONONUCLEAR NO ORGANISMS SEEN Performed at [facility] Performed at [facility], [address] [place], [place] [address]    Report Status 06/16/2019 FINAL  Final  Culture, body fluid-bottle     Status: None (Preliminary result)   Collection Time: 06/18/19  5:45 PM   Specimen: Ascitic  Result Value Ref Range Status   Specimen Description ASCITIC  Final   Special Requests   Final    BOTTLES DRAWN AEROBIC AND ANAEROBIC Blood Culture adequate volume   Culture   Final    NO GROWTH 3 DAYS Performed  at [facility], [address], [place], [place] [address]    Report Status PENDING  Incomplete  Gram stain     Status: None   Collection Time: 06/18/19  5:45 PM   Specimen: Ascitic  Result Value Ref Range Status   Specimen Description ASCITIC  Final   Special Requests NONE  Final   Gram Stain   Final    NO ORGANISMS SEEN Performed at [facility] WBC PRESENT,BOTH PMN AND MONONUCLEAR CYTOSPIN SMEAR Performed at [facility], [address], [place], [place] [address]    Report Status 06/18/2019 FINAL  Final  Respiratory Panel by RT PCR (Flu A&B, Covid) - Nasopharyngeal Swab     Status: None   Collection Time: 06/18/19  8:02 PM   Specimen: Nasopharyngeal Swab  Result Value Ref Range Status   SARS Coronavirus 2 by RT PCR NEGATIVE NEGATIVE Final    Comment: (NOTE) SARS-CoV-2 target nucleic acids are NOT DETECTED. The SARS-CoV-2 RNA is generally detectable in upper respiratoy specimens during the acute phase of infection. The lowest concentration of SARS-CoV-2 viral copies this assay can detect is 131 copies/mL. A negative result does not preclude SARS-Cov-2 infection and should not be used as the sole basis for treatment or other patient management decisions. A negative result may occur with  improper specimen collection/handling, submission of specimen other than nasopharyngeal swab, presence of viral mutation(s) within the areas targeted by this assay, and inadequate number of viral copies (<131 copies/mL). A negative result must be combined with clinical observations, patient history, and epidemiological information. The expected result is Negative. Fact Sheet for Patients:  https://www.moore.com/ Fact Sheet for Healthcare Providers:  https://www.young.biz/ This test is not yet ap proved or cleared by the [place] FDA and  has been authorized for detection and/or diagnosis of SARS-CoV-2 by FDA under an Emergency Use Authorization (EUA). This EUA will remain  in effect (meaning this test can be used) for the duration of the COVID-19 declaration under Section 564(b)(1) of the Act, 21 U.S.C. section 360bbb-3(b)(1), unless the authorization is terminated or revoked sooner.    Influenza A by PCR NEGATIVE NEGATIVE Final   Influenza B by PCR NEGATIVE NEGATIVE Final    Comment:  (NOTE) The Xpert Xpress SARS-CoV-2/FLU/RSV assay is intended as an aid in  the diagnosis of influenza from Nasopharyngeal swab specimens and  should not be used as a sole basis for treatment. Nasal washings and  aspirates are unacceptable for Xpert Xpress SARS-CoV-2/FLU/RSV  testing. Fact Sheet for Patients: https://www.moore.com/ Fact Sheet for Healthcare Providers: https://www.young.biz/ This test is not yet approved or cleared by the [place] FDA and  has been authorized for detection and/or diagnosis of SARS-CoV-2 by  FDA under an Emergency Use Authorization (EUA). This EUA will remain  in effect (meaning this test can be used) for the duration of the  Covid-19 declaration under Section 564(b)(1) of the Act, 21  U.S.C. section 360bbb-3(b)(1), unless the authorization is  terminated or revoked. Performed at [facility], [address], [place], [place] [address]      Labs: BNP (last 3 results) Recent Labs    06/18/19 1619  BNP 38.0   Basic Metabolic Panel: Recent Labs  Lab 06/18/19 1619 06/19/19 0520 06/20/19 0531 06/20/19 1038 06/21/19 0729  NA 122* 124* 126* 128* 127*  K 3.4* 3.7 3.8 3.7 4.1  CL 87* 91* 93* 93* 93*  CO2 GLUCOSE 128* 97 99 103* 107*  BUN 19  15 13 13 13   CREATININE 0.83 0.68 0.76 0.81 0.83  CALCIUM 8.1* 7.8* 7.9* 8.0* 8.1*  MG 1.9  --   --   --   --   PHOS 3.3  --   --   --   --    Liver Function Tests: Recent Labs  Lab 06/16/19 1134 06/18/19 1619 06/19/19 0520 06/20/19 0531 06/21/19 0729  AST 70* 84* 62* 57* 55*  ALT 32 36 27 27 28   ALKPHOS 82 75 59 56 64  BILITOT 5.9* 6.3* 5.3* 5.9* 6.2*  PROT 6.9 6.8 5.4* 5.7* 6.0*  ALBUMIN 2.3* 2.2* 1.8* 2.3* 2.4*   Recent Labs  Lab 06/18/19 1619  LIPASE 34   Recent Labs  Lab 06/18/19 1619  AMMONIA 24   CBC: Recent Labs  Lab 06/18/19 1619 06/18/19 2213 06/19/19 0520 06/20/19 0531 06/21/19 0729  WBC 9.2  --  7.1 6.7 8.9   NEUTROABS 6.6  --   --  4.1  --   HGB 11.5* 10.9* 9.4* 9.5* 10.4*  HCT 32.9* 31.7* 27.0* 27.5* 30.7*  MCV 104.1*  --  103.1* 107.0* 107.7*  PLT 218  --  154 144* 175   Cardiac Enzymes: No results for input(s): CKTOTAL, CKMB, CKMBINDEX, TROPONINI in the last 168 hours. BNP: Invalid input(s): POCBNP CBG: No results for input(s): GLUCAP in the last 168 hours. D-Dimer No results for input(s): DDIMER in the last 72 hours. Hgb A1c No results for input(s): HGBA1C in the last 72 hours. Lipid Profile No results for input(s): CHOL, HDL, LDLCALC, TRIG, CHOLHDL, LDLDIRECT in the last 72 hours. Thyroid function studies No results for input(s): TSH, T4TOTAL, T3FREE, THYROIDAB in the last 72 hours.  Invalid input(s): FREET3 Anemia work up 06/22/19    06/19/19 0520 06/19/19 0815  VITAMINB12 917*  --   FOLATE 7.3  --   FERRITIN  --  601*  TIBC  --  124*  IRON  --  96   Urinalysis No results found for: COLORURINE, APPEARANCEUR, LABSPEC, PHURINE, GLUCOSEU, HGBUR, BILIRUBINUR, KETONESUR, PROTEINUR, UROBILINOGEN, NITRITE, LEUKOCYTESUR Sepsis Labs Invalid input(s): PROCALCITONIN,  WBC,  LACTICIDVEN Microbiology Recent Results (from the past 240 hour(s))  Culture, body fluid-bottle     Status: None   Collection Time: 06/16/19 10:35 AM   Specimen: Fluid  Result Value Ref Range Status   Specimen Description FLUID ASCITIC DR 06/21/19  Final   Special Requests BOTTLES DRAWN AEROBIC AND ANAEROBIC 10CC EACH  Final   Culture   Final    NO GROWTH 5 DAYS Performed at [facility], [address], [place], [address] [place]    Report Status 06/21/2019 FINAL  Final  Gram stain     Status: None   Collection Time: 06/16/19 10:40 AM   Specimen: Ascitic; Body Fluid  Result Value Ref Range Status   Specimen Description ASCITIC  Final   Special Requests NONE  Final   Gram Stain   Final    CYTOSPIN SMEAR WBC PRESENT,BOTH PMN AND MONONUCLEAR NO ORGANISMS SEEN Performed at [facility] Performed at [facility], [address], [place], [address] [place]    Report Status 06/16/2019 FINAL  Final  Culture, body fluid-bottle     Status: None (Preliminary result)   Collection Time: 06/18/19  5:45 PM   Specimen: Ascitic  Result Value Ref Range Status   Specimen Description ASCITIC  Final   Special Requests   Final    BOTTLES DRAWN AEROBIC AND ANAEROBIC Blood Culture adequate volume  Culture   Final    NO GROWTH 3 DAYS Performed at [facility], [address], [place], [place] [address]    Report Status PENDING  Incomplete  Gram stain     Status: None   Collection Time: 06/18/19  5:45 PM   Specimen: Ascitic  Result Value Ref Range Status   Specimen Description ASCITIC  Final   Special Requests NONE  Final   Gram Stain   Final    NO ORGANISMS SEEN Performed at [facility] WBC PRESENT,BOTH PMN AND MONONUCLEAR CYTOSPIN SMEAR Performed at [facility], [address] [place], [place] [address]    Report Status 06/18/2019 FINAL  Final  Respiratory Panel by RT PCR (Flu A&B, Covid) - Nasopharyngeal Swab     Status: None   Collection Time: 06/18/19  8:02 PM   Specimen: Nasopharyngeal Swab  Result Value Ref Range Status   SARS Coronavirus 2 by RT PCR NEGATIVE NEGATIVE Final    Comment: (NOTE) SARS-CoV-2 target nucleic acids are NOT DETECTED. The SARS-CoV-2 RNA is generally detectable in upper respiratoy specimens during the acute phase of infection. The lowest concentration of SARS-CoV-2 viral copies this assay can detect is 131 copies/mL. A negative result does not preclude SARS-Cov-2 infection and should not be used as the sole basis for treatment or other patient management decisions. A negative result may occur with  improper specimen collection/handling, submission of specimen other than nasopharyngeal swab, presence of viral mutation(s) within the areas targeted by this assay, and inadequate number of viral copies (<131 copies/mL). A negative  result must be combined with clinical observations, patient history, and epidemiological information. The expected result is Negative. Fact Sheet for Patients:  https://www.moore.com/ Fact Sheet for Healthcare Providers:  https://www.young.biz/ This test is not yet ap proved or cleared by the [place] FDA and  has been authorized for detection and/or diagnosis of SARS-CoV-2 by FDA under an Emergency Use Authorization (EUA). This EUA will remain  in effect (meaning this test can be used) for the duration of the COVID-19 declaration under Section 564(b)(1) of the Act, 21 U.S.C. section 360bbb-3(b)(1), unless the authorization is terminated or revoked sooner.    Influenza A by PCR NEGATIVE NEGATIVE Final   Influenza B by PCR NEGATIVE NEGATIVE Final    Comment: (NOTE) The Xpert Xpress SARS-CoV-2/FLU/RSV assay is intended as an aid in  the diagnosis of influenza from Nasopharyngeal swab specimens and  should not be used as a sole basis for treatment. Nasal washings and  aspirates are unacceptable for Xpert Xpress SARS-CoV-2/FLU/RSV  testing. Fact Sheet for Patients: https://www.moore.com/ Fact Sheet for Healthcare Providers: https://www.young.biz/ This test is not yet approved or cleared by the [place] FDA and  has been authorized for detection and/or diagnosis of SARS-CoV-2 by  FDA under an Emergency Use Authorization (EUA). This EUA will remain  in effect (meaning this test can be used) for the duration of the  Covid-19 declaration under Section 564(b)(1) of the Act, 21  U.S.C. section 360bbb-3(b)(1), unless the authorization is  terminated or revoked. Performed at [facility], [address] [place], [place] [address]      Time coordinating discharge:  SIGNED:   Erick Blinks, MD  [facility] 06/21/2019, 7:16 PM   If 7PM-7AM, please contact  night-coverage [email or web address]

## 2019-06-21 NOTE — Progress Notes (Signed)
   Assessment/Plan: Admitted with PARTIALLY DECOMPENSATED LIVER FAILURE/GI BLEED. CLINICALLY IMPROVED. EGD YESTERDAY WITH BLEEDING AT BIOPSY SITES. 2 CLIPS IN COLON. BP/Hb STABLE.  PLAN: 1. OK TO D/C ON ALDACTONE 100 MG DAILY. HOLD LASIX. NEEDS BMP APR 22. 2. WILL ARRANGE FOR PARACENTESIS NEXT WEEK. 3. OPV IN 4-6 WEEKS WITH RGA. 4. BID PPI.   Subjective: Since I last evaluated the patient 1 SML EPISODE OF MELE[place]. NO NAUSEA OR VOMITING. NO BRBPR ,ABDOMINAL OR CHEST PAIN.  Objective: Vital signs in last 24 hours: Vitals:   06/21/19 0843 06/21/19 1010  BP:  106/83  Pulse:  87  Resp:    Temp:    SpO2: 94%    General appearance: alert, cooperative and appears older than stated age Resp: clear to auscultation bilaterally Cardio: regular rate and rhythm GI: soft, non-tender; bowel sounds normal; DISTENDED EXTREMITIES: 3-4+ EDEMA BILATERALLY.  Lab Results: Na 127 Cr 0.84 T BILI 6.2 Hb 10.2 PLT CT 175   Studies/Results: No results found.  Medications: I have reviewed the patient's current medications.

## 2019-06-21 NOTE — Progress Notes (Signed)
Nsg Discharge Note  Admit Date:  06/18/2019 Discharge date: 06/21/2019   Alan Phillips to be D/C'd home per MD order.  AVS completed.  Copy for chart, and copy for patient signed, and dated. Patient/caregiver able to verbalize understanding.  Discharge Medication: Allergies as of 06/21/2019      Reactions   Codeine Hives      Medication List    STOP taking these medications   furosemide 40 MG tablet Commonly known as: LASIX   omeprazole 20 MG capsule Commonly known as: PRILOSEC     TAKE these medications   Lactulose 20 GM/30ML Soln Take 30 mLs (20 g total) by mouth in the morning and at bedtime.   pantoprazole 40 MG tablet Commonly known as: Protonix Take 1 tablet (40 mg total) by mouth 2 (two) times daily.   spironolactone 100 MG tablet Commonly known as: Aldactone Take 1 tablet (100 mg total) by mouth daily.       Discharge Assessment: Vitals:   06/21/19 0843 06/21/19 1010  BP:  106/83  Pulse:  87  Resp:    Temp:    SpO2: 94%    Skin clean, dry and intact without evidence of skin break down, no evidence of skin tears noted. IV catheter discontinued intact. Site without signs and symptoms of complications - no redness or edema noted at insertion site, patient denies c/o pain - only slight tenderness at site.  Dressing with slight pressure applied.  D/c Instructions-Education: Discharge instructions given to patient/family with verbalized understanding. D/c education completed with patient/family including follow up instructions, medication list, d/c activities limitations if indicated, with other d/c instructions as indicated by MD - patient able to verbalize understanding, all questions fully answered. Patient instructed to return to ED, call 911, or call MD for any changes in condition.  Patient escorted via WC, and D/C home via private auto.  Sharmon Leyden, RN 06/21/2019 2:25 PM

## 2019-06-22 LAB — TYPE AND SCREEN
ABO/RH(D): A POS
Antibody Screen: NEGATIVE
Unit division: 0
Unit division: 0

## 2019-06-22 LAB — BPAM RBC
Blood Product Expiration Date: 202105082359
Blood Product Expiration Date: 202105082359
Unit Type and Rh: 6200
Unit Type and Rh: 6200

## 2019-06-23 ENCOUNTER — Telehealth: Payer: Self-pay | Admitting: General Practice

## 2019-06-23 LAB — CULTURE, BODY FLUID W GRAM STAIN -BOTTLE
Culture: NO GROWTH
Special Requests: ADEQUATE

## 2019-06-23 NOTE — Telephone Encounter (Signed)
Patient's wife called stating he needs to have a Para done this week.  She stated she was told the patient has a standing order for Para's, because he would need to have them weekly.  He is experiencing some fluid in his belly and would like to have a Para done this week.  Routing to the clinical pool.  The patient can be reached at [phone].

## 2019-06-24 ENCOUNTER — Other Ambulatory Visit: Payer: Self-pay | Admitting: *Deleted

## 2019-06-24 ENCOUNTER — Other Ambulatory Visit: Payer: Self-pay

## 2019-06-24 DIAGNOSIS — R188 Other ascites: Secondary | ICD-10-CM

## 2019-06-24 LAB — SURGICAL PATHOLOGY

## 2019-06-24 NOTE — Telephone Encounter (Signed)
Standing order faxed to central scheduling at [phone] and [phone]. Orders also placed for PARA.  PARA scheduled for 06/26/2019 at 9:00am, arrival 8:45am.  Called pt and spoke with spouse connie. Aware of appt details. Also aware patient needs to hold diuretics day of para's. She voiced understanding. She also has been given # to CS to call for future PARA appts.

## 2019-06-24 NOTE — Telephone Encounter (Signed)
We don't have standing orders on patient. Fowarding to [facility]

## 2019-06-24 NOTE — Telephone Encounter (Signed)
Can give standing order.  LVAP with max of 8 liters to be drawn at a time. Albumin per protocol. Obtain cell count with diff, anerobic/aerobic cultures in blood culture bottles, each time.  Hold diuretics day of tap. Standing order for six months for WEEKLY paras as needed.

## 2019-06-26 ENCOUNTER — Ambulatory Visit (HOSPITAL_COMMUNITY)
Admission: RE | Admit: 2019-06-26 | Discharge: 2019-06-26 | Disposition: A | Discharge status: Home / Self Care | Payer: Self-pay | Source: Ambulatory Visit | Attending: Gastroenterology | Admitting: Gastroenterology

## 2019-06-26 ENCOUNTER — Other Ambulatory Visit: Payer: Self-pay

## 2019-06-26 ENCOUNTER — Encounter (HOSPITAL_COMMUNITY): Payer: Self-pay

## 2019-06-26 DIAGNOSIS — R188 Other ascites: Secondary | ICD-10-CM | POA: Insufficient documentation

## 2019-06-26 LAB — BODY FLUID CELL COUNT WITH DIFFERENTIAL
Eos, Fluid: 0 %
Lymphs, Fluid: 75 %
Monocyte-Macrophage-Serous Fluid: 22 % — ABNORMAL LOW (ref 50–90)
Neutrophil Count, Fluid: 3 % (ref 0–25)
Other Cells, Fluid: REACTIVE %
Total Nucleated Cell Count, Fluid: 115 cu mm (ref 0–1000)

## 2019-06-26 LAB — GRAM STAIN: Gram Stain: NONE SEEN

## 2019-06-26 MED ORDER — ALBUMIN HUMAN 25 % IV SOLN
INTRAVENOUS | Status: AC
  Administered 2019-06-26: 50 g via INTRAVENOUS
  Filled 2019-06-26: qty 200

## 2019-06-26 MED ORDER — ALBUMIN HUMAN 25 % IV SOLN: 50.0000 g | Freq: Once | INTRAVENOUS | Status: AC

## 2019-06-26 NOTE — Progress Notes (Signed)
Paracentesis complete no signs of distress.  

## 2019-06-26 NOTE — Procedures (Signed)
   US guided LLQ paracentesis 8L yellow fluid  Tolerated well  Sent for labs per MD  EBL: 1cc

## 2019-06-27 LAB — PATHOLOGIST SMEAR REVIEW

## 2019-06-29 LAB — ACID FAST CULTURE WITH REFLEXED SENSITIVITIES (MYCOBACTERIA): Acid Fast Culture: NEGATIVE

## 2019-06-30 ENCOUNTER — Encounter: Payer: Self-pay | Admitting: Internal Medicine

## 2019-07-01 LAB — CULTURE, BODY FLUID W GRAM STAIN -BOTTLE: Culture: NO GROWTH

## 2019-07-05 DIAGNOSIS — K652 Spontaneous bacterial peritonitis: Secondary | ICD-10-CM

## 2019-07-05 HISTORY — DX: Spontaneous bacterial peritonitis: K65.2

## 2019-07-08 ENCOUNTER — Encounter (HOSPITAL_COMMUNITY): Payer: Self-pay

## 2019-07-08 ENCOUNTER — Ambulatory Visit (HOSPITAL_COMMUNITY)
Admission: RE | Admit: 2019-07-08 | Discharge: 2019-07-08 | Disposition: A | Discharge status: Home / Self Care | Payer: Self-pay | Source: Ambulatory Visit | Attending: Gastroenterology | Admitting: Gastroenterology

## 2019-07-08 ENCOUNTER — Other Ambulatory Visit: Payer: Self-pay

## 2019-07-08 DIAGNOSIS — R188 Other ascites: Secondary | ICD-10-CM | POA: Insufficient documentation

## 2019-07-08 LAB — BODY FLUID CELL COUNT WITH DIFFERENTIAL
Eos, Fluid: 0 %
Lymphs, Fluid: 67 %
Monocyte-Macrophage-Serous Fluid: 29 % — ABNORMAL LOW (ref 50–90)
Neutrophil Count, Fluid: 4 % (ref 0–25)
Total Nucleated Cell Count, Fluid: 72 cu mm (ref 0–1000)

## 2019-07-08 LAB — GRAM STAIN: Gram Stain: NONE SEEN

## 2019-07-08 NOTE — Procedures (Signed)
PreOperative Dx: Cirrhosis, ascites Postoperative Dx: Cirrhosis, ascites Procedure:   US guided paracentesis Radiologist:  Nelda Luckey Anesthesia:  10 ml of1% lidocaine Specimen:  4.2 L of yellow ascitic fluid EBL:   < 1 ml Complications: None  

## 2019-07-08 NOTE — Progress Notes (Signed)
Paracentesis complete no signs of distress.  

## 2019-07-09 LAB — PATHOLOGIST SMEAR REVIEW

## 2019-07-11 ENCOUNTER — Other Ambulatory Visit: Payer: Self-pay | Admitting: Gastroenterology

## 2019-07-11 DIAGNOSIS — R188 Other ascites: Secondary | ICD-10-CM

## 2019-07-13 LAB — CULTURE, BODY FLUID W GRAM STAIN -BOTTLE: Culture: NO GROWTH

## 2019-07-16 ENCOUNTER — Encounter (HOSPITAL_COMMUNITY): Payer: Self-pay

## 2019-07-16 ENCOUNTER — Other Ambulatory Visit: Payer: Self-pay

## 2019-07-16 ENCOUNTER — Ambulatory Visit (HOSPITAL_COMMUNITY)
Admission: RE | Admit: 2019-07-16 | Discharge: 2019-07-16 | Disposition: A | Discharge status: Home / Self Care | Payer: Self-pay | Source: Ambulatory Visit | Attending: Gastroenterology | Admitting: Gastroenterology

## 2019-07-16 DIAGNOSIS — R188 Other ascites: Secondary | ICD-10-CM | POA: Insufficient documentation

## 2019-07-16 LAB — BODY FLUID CELL COUNT WITH DIFFERENTIAL
Eos, Fluid: 0 %
Lymphs, Fluid: 13 %
Monocyte-Macrophage-Serous Fluid: 48 % — ABNORMAL LOW (ref 50–90)
Neutrophil Count, Fluid: 39 % — ABNORMAL HIGH (ref 0–25)
Other Cells, Fluid: NONE SEEN %
Total Nucleated Cell Count, Fluid: 194 cu mm (ref 0–1000)

## 2019-07-16 NOTE — Progress Notes (Signed)
Paracentesis complete no signs of distress.  

## 2019-07-17 LAB — PATHOLOGIST SMEAR REVIEW

## 2019-07-22 ENCOUNTER — Encounter (HOSPITAL_COMMUNITY): Payer: Self-pay

## 2019-07-22 ENCOUNTER — Ambulatory Visit (HOSPITAL_COMMUNITY)
Admission: RE | Admit: 2019-07-22 | Discharge: 2019-07-22 | Disposition: A | Discharge status: Home / Self Care | Payer: Self-pay | Source: Ambulatory Visit | Attending: Gastroenterology | Admitting: Gastroenterology

## 2019-07-22 ENCOUNTER — Other Ambulatory Visit: Payer: Self-pay

## 2019-07-22 DIAGNOSIS — R188 Other ascites: Secondary | ICD-10-CM | POA: Insufficient documentation

## 2019-07-22 LAB — BODY FLUID CELL COUNT WITH DIFFERENTIAL
Eos, Fluid: 0 %
Lymphs, Fluid: 83 %
Monocyte-Macrophage-Serous Fluid: 15 % — ABNORMAL LOW (ref 50–90)
Neutrophil Count, Fluid: 2 % (ref 0–25)
Total Nucleated Cell Count, Fluid: 76 cu mm (ref 0–1000)

## 2019-07-22 LAB — GRAM STAIN: Gram Stain: NONE SEEN

## 2019-07-22 NOTE — Procedures (Signed)
PreOperative Dx: Cirrhosis, ascites Postoperative Dx: Cirrhosis, ascites Procedure:   US guided paracentesis Radiologist:  Larkin Alfred Anesthesia:  10 ml of1% lidocaine Specimen:  4.2 L of yellow ascitic fluid EBL:   < 1 ml Complications: None  

## 2019-07-22 NOTE — Progress Notes (Signed)
Paracentesis complete no signs of distress.  

## 2019-07-23 ENCOUNTER — Telehealth: Payer: Self-pay | Admitting: Emergency Medicine

## 2019-07-23 ENCOUNTER — Other Ambulatory Visit: Payer: Self-pay | Admitting: *Deleted

## 2019-07-23 ENCOUNTER — Other Ambulatory Visit (HOSPITAL_COMMUNITY): Payer: Self-pay | Admitting: Gastroenterology

## 2019-07-23 ENCOUNTER — Other Ambulatory Visit: Payer: Self-pay | Admitting: Gastroenterology

## 2019-07-23 ENCOUNTER — Telehealth: Payer: Self-pay | Admitting: Internal Medicine

## 2019-07-23 DIAGNOSIS — R895 Abnormal microbiological findings in specimens from other organs, systems and tissues: Secondary | ICD-10-CM

## 2019-07-23 DIAGNOSIS — R188 Other ascites: Secondary | ICD-10-CM

## 2019-07-23 LAB — PATHOLOGIST SMEAR REVIEW

## 2019-07-23 MED ORDER — BACLOFEN 10 MG PO TABS: 10.0000 mg | ORAL_TABLET | Freq: Three times a day (TID) | ORAL | 0 refills | Status: AC

## 2019-07-23 MED ORDER — SPIRONOLACTONE 100 MG PO TABS: 100.0000 mg | ORAL_TABLET | Freq: Every day | ORAL | 5 refills | Status: DC

## 2019-07-23 NOTE — Telephone Encounter (Signed)
Bobby from [facility] called and stated cultures came back positive on pt

## 2019-07-23 NOTE — Telephone Encounter (Signed)
Routing to [facility] and ME. Pt's spouse called [facility] to arrange para and they told pt that they needed an approval from our office.

## 2019-07-23 NOTE — Telephone Encounter (Signed)
See result note.  

## 2019-07-23 NOTE — Telephone Encounter (Signed)
Pt's wife, Junious Dresser, called to say that she spoke with [facility] about patient needing a para. She called radiology and said they told her 1 of the 3 bottles had bacteria in it and they may not be able to draw fluid tomorrow. Please advise and call Junious Dresser at [phone]

## 2019-07-23 NOTE — Telephone Encounter (Signed)
Called spoke with [place]. Made aware I have placed a new order with [facility] orders. She voiced understanding.

## 2019-07-24 ENCOUNTER — Other Ambulatory Visit: Payer: Self-pay

## 2019-07-24 ENCOUNTER — Encounter (HOSPITAL_COMMUNITY): Payer: Self-pay

## 2019-07-24 ENCOUNTER — Ambulatory Visit (HOSPITAL_COMMUNITY): Payer: Self-pay

## 2019-07-24 ENCOUNTER — Ambulatory Visit (HOSPITAL_COMMUNITY)
Admission: RE | Admit: 2019-07-24 | Discharge: 2019-07-24 | Disposition: A | Discharge status: Home / Self Care | Payer: Self-pay | Source: Ambulatory Visit | Attending: Gastroenterology | Admitting: Gastroenterology

## 2019-07-24 DIAGNOSIS — R895 Abnormal microbiological findings in specimens from other organs, systems and tissues: Secondary | ICD-10-CM | POA: Insufficient documentation

## 2019-07-24 LAB — GRAM STAIN: Gram Stain: NONE SEEN

## 2019-07-24 LAB — BODY FLUID CELL COUNT WITH DIFFERENTIAL
Eos, Fluid: 0 %
Lymphs, Fluid: 64 %
Monocyte-Macrophage-Serous Fluid: 26 % — ABNORMAL LOW (ref 50–90)
Neutrophil Count, Fluid: 10 % (ref 0–25)
Total Nucleated Cell Count, Fluid: 83 cu mm (ref 0–1000)

## 2019-07-24 NOTE — Progress Notes (Signed)
Paracentesis complete no signs of distress.  

## 2019-07-24 NOTE — Procedures (Signed)
PreOperative Dx: Cirrhosis, ascites, positive culture finding  Postoperative Dx: Cirrhosis, ascites, positive culture finding Procedure:   US guided paracentesis Radiologist:  Tyron Russell Anesthesia:  10 ml of1% lidocaine Specimen:  180 mL of yellow ascitic fluid EBL:   < 1 ml Complications: None

## 2019-07-25 ENCOUNTER — Telehealth: Payer: Self-pay | Admitting: Gastroenterology

## 2019-07-25 ENCOUNTER — Other Ambulatory Visit: Payer: Self-pay

## 2019-07-25 ENCOUNTER — Encounter (HOSPITAL_COMMUNITY): Payer: Self-pay | Admitting: *Deleted

## 2019-07-25 ENCOUNTER — Emergency Department (HOSPITAL_COMMUNITY): Payer: Self-pay

## 2019-07-25 ENCOUNTER — Inpatient Hospital Stay (HOSPITAL_COMMUNITY)
Admission: EM | Admit: 2019-07-25 | Discharge: 2019-07-28 | DRG: 372 | Disposition: A | Discharge status: Home / Self Care | Payer: Self-pay | Attending: Internal Medicine | Admitting: Internal Medicine

## 2019-07-25 DIAGNOSIS — D649 Anemia, unspecified: Secondary | ICD-10-CM | POA: Diagnosis present

## 2019-07-25 DIAGNOSIS — R000 Tachycardia, unspecified: Secondary | ICD-10-CM | POA: Diagnosis present

## 2019-07-25 DIAGNOSIS — F329 Major depressive disorder, single episode, unspecified: Secondary | ICD-10-CM | POA: Diagnosis present

## 2019-07-25 DIAGNOSIS — Z20822 Contact with and (suspected) exposure to covid-19: Secondary | ICD-10-CM | POA: Diagnosis present

## 2019-07-25 DIAGNOSIS — Z79899 Other long term (current) drug therapy: Secondary | ICD-10-CM

## 2019-07-25 DIAGNOSIS — Z8249 Family history of ischemic heart disease and other diseases of the circulatory system: Secondary | ICD-10-CM

## 2019-07-25 DIAGNOSIS — K3189 Other diseases of stomach and duodenum: Secondary | ICD-10-CM | POA: Diagnosis present

## 2019-07-25 DIAGNOSIS — K429 Umbilical hernia without obstruction or gangrene: Secondary | ICD-10-CM | POA: Diagnosis present

## 2019-07-25 DIAGNOSIS — R9431 Abnormal electrocardiogram [ECG] [EKG]: Secondary | ICD-10-CM | POA: Diagnosis present

## 2019-07-25 DIAGNOSIS — E876 Hypokalemia: Secondary | ICD-10-CM | POA: Diagnosis present

## 2019-07-25 DIAGNOSIS — E871 Hypo-osmolality and hyponatremia: Secondary | ICD-10-CM | POA: Diagnosis present

## 2019-07-25 DIAGNOSIS — Z87891 Personal history of nicotine dependence: Secondary | ICD-10-CM

## 2019-07-25 DIAGNOSIS — K649 Unspecified hemorrhoids: Secondary | ICD-10-CM | POA: Diagnosis present

## 2019-07-25 DIAGNOSIS — K652 Spontaneous bacterial peritonitis: Principal | ICD-10-CM | POA: Diagnosis present

## 2019-07-25 DIAGNOSIS — K7031 Alcoholic cirrhosis of liver with ascites: Secondary | ICD-10-CM | POA: Diagnosis present

## 2019-07-25 DIAGNOSIS — Z885 Allergy status to narcotic agent status: Secondary | ICD-10-CM

## 2019-07-25 DIAGNOSIS — B9620 Unspecified Escherichia coli [E. coli] as the cause of diseases classified elsewhere: Secondary | ICD-10-CM | POA: Diagnosis present

## 2019-07-25 DIAGNOSIS — K659 Peritonitis, unspecified: Secondary | ICD-10-CM

## 2019-07-25 DIAGNOSIS — K921 Melena: Secondary | ICD-10-CM | POA: Diagnosis present

## 2019-07-25 DIAGNOSIS — R601 Generalized edema: Secondary | ICD-10-CM | POA: Diagnosis present

## 2019-07-25 DIAGNOSIS — K449 Diaphragmatic hernia without obstruction or gangrene: Secondary | ICD-10-CM | POA: Diagnosis present

## 2019-07-25 DIAGNOSIS — I8510 Secondary esophageal varices without bleeding: Secondary | ICD-10-CM | POA: Diagnosis present

## 2019-07-25 DIAGNOSIS — L409 Psoriasis, unspecified: Secondary | ICD-10-CM | POA: Diagnosis present

## 2019-07-25 DIAGNOSIS — K7460 Unspecified cirrhosis of liver: Secondary | ICD-10-CM

## 2019-07-25 DIAGNOSIS — Z8489 Family history of other specified conditions: Secondary | ICD-10-CM

## 2019-07-25 LAB — CBC WITH DIFFERENTIAL/PLATELET
Abs Immature Granulocytes: 0.09 10*3/uL — ABNORMAL HIGH (ref 0.00–0.07)
Basophils Absolute: 0.1 10*3/uL (ref 0.0–0.1)
Basophils Relative: 1 %
Eosinophils Absolute: 0.1 10*3/uL (ref 0.0–0.5)
Eosinophils Relative: 1 %
HCT: 25.8 % — ABNORMAL LOW (ref 39.0–52.0)
Hemoglobin: 8.9 g/dL — ABNORMAL LOW (ref 13.0–17.0)
Immature Granulocytes: 1 %
Lymphocytes Relative: 15 %
Lymphs Abs: 1.2 10*3/uL (ref 0.7–4.0)
MCH: 37.2 pg — ABNORMAL HIGH (ref 26.0–34.0)
MCHC: 34.5 g/dL (ref 30.0–36.0)
MCV: 107.9 fL — ABNORMAL HIGH (ref 80.0–100.0)
Monocytes Absolute: 1.6 10*3/uL — ABNORMAL HIGH (ref 0.1–1.0)
Monocytes Relative: 21 %
Neutro Abs: 4.8 10*3/uL (ref 1.7–7.7)
Neutrophils Relative %: 61 %
Platelets: 232 10*3/uL (ref 150–400)
RBC: 2.39 MIL/uL — ABNORMAL LOW (ref 4.22–5.81)
RDW: 15.4 % (ref 11.5–15.5)
WBC: 7.7 10*3/uL (ref 4.0–10.5)
nRBC: 0 % (ref 0.0–0.2)

## 2019-07-25 LAB — BASIC METABOLIC PANEL
Anion gap: 9 (ref 5–15)
BUN: 22 mg/dL — ABNORMAL HIGH (ref 6–20)
CO2: 23 mmol/L (ref 22–32)
Calcium: 8 mg/dL — ABNORMAL LOW (ref 8.9–10.3)
Chloride: 89 mmol/L — ABNORMAL LOW (ref 98–111)
Creatinine, Ser: 1.1 mg/dL (ref 0.61–1.24)
GFR calc Af Amer: >60 mL/min (ref >60)
GFR calc non Af Amer: >60 mL/min (ref >60)
Glucose, Bld: 135 mg/dL — ABNORMAL HIGH (ref 70–99)
Potassium: 4 mmol/L (ref 3.5–5.1)
Sodium: 121 mmol/L — ABNORMAL LOW (ref 135–145)

## 2019-07-25 LAB — COMPREHENSIVE METABOLIC PANEL
ALT: 23 U/L (ref 0–44)
AST: 48 U/L — ABNORMAL HIGH (ref 15–41)
Albumin: 2 g/dL — ABNORMAL LOW (ref 3.5–5.0)
Alkaline Phosphatase: 83 U/L (ref 38–126)
Anion gap: 10 (ref 5–15)
BUN: 24 mg/dL — ABNORMAL HIGH (ref 6–20)
CO2: 21 mmol/L — ABNORMAL LOW (ref 22–32)
Calcium: 7.9 mg/dL — ABNORMAL LOW (ref 8.9–10.3)
Chloride: 90 mmol/L — ABNORMAL LOW (ref 98–111)
Creatinine, Ser: 1.07 mg/dL (ref 0.61–1.24)
GFR calc Af Amer: >60 mL/min (ref >60)
GFR calc non Af Amer: >60 mL/min (ref >60)
Glucose, Bld: 128 mg/dL — ABNORMAL HIGH (ref 70–99)
Potassium: 4.3 mmol/L (ref 3.5–5.1)
Sodium: 121 mmol/L — ABNORMAL LOW (ref 135–145)
Total Bilirubin: 6.6 mg/dL — ABNORMAL HIGH (ref 0.3–1.2)
Total Protein: 6.7 g/dL (ref 6.5–8.1)

## 2019-07-25 LAB — CULTURE, BODY FLUID W GRAM STAIN -BOTTLE

## 2019-07-25 LAB — URINALYSIS, ROUTINE W REFLEX MICROSCOPIC
Bacteria, UA: NONE SEEN
Bilirubin Urine: NEGATIVE
Glucose, UA: NEGATIVE mg/dL
Ketones, ur: NEGATIVE mg/dL
Leukocytes,Ua: NEGATIVE
Nitrite: NEGATIVE
Protein, ur: NEGATIVE mg/dL
Specific Gravity, Urine: 1.012 (ref 1.005–1.030)
pH: 5 (ref 5.0–8.0)

## 2019-07-25 LAB — PATHOLOGIST SMEAR REVIEW

## 2019-07-25 LAB — SARS CORONAVIRUS 2 BY RT PCR (HOSPITAL ORDER, PERFORMED IN ~~LOC~~ HOSPITAL LAB): SARS Coronavirus 2: NEGATIVE

## 2019-07-25 MED ORDER — LACTULOSE 10 GM/15ML PO SOLN
20.0000 g | Freq: Two times a day (BID) | ORAL | Status: DC
  Administered 2019-07-25 – 2019-07-26 (×3): 20 g via ORAL
  Filled 2019-07-25 (×4): qty 30

## 2019-07-25 MED ORDER — FUROSEMIDE 10 MG/ML IJ SOLN
40.0000 mg | Freq: Once | INTRAMUSCULAR | Status: AC
  Administered 2019-07-25: 40 mg via INTRAVENOUS
  Filled 2019-07-25: qty 4

## 2019-07-25 MED ORDER — LACTULOSE 20 GM/30ML PO SOLN: 30.0000 mL | Freq: Two times a day (BID) | ORAL | Status: DC

## 2019-07-25 MED ORDER — SODIUM CHLORIDE 0.9 % IV SOLN
1.0000 g | INTRAVENOUS | Status: DC
  Administered 2019-07-25: 1 g via INTRAVENOUS
  Filled 2019-07-25: qty 10

## 2019-07-25 MED ORDER — FUROSEMIDE 40 MG PO TABS
40.0000 mg | ORAL_TABLET | Freq: Every day | ORAL | Status: DC
  Administered 2019-07-26 – 2019-07-28 (×3): 40 mg via ORAL
  Filled 2019-07-25 (×3): qty 1

## 2019-07-25 MED ORDER — SPIRONOLACTONE 25 MG PO TABS
100.0000 mg | ORAL_TABLET | Freq: Every day | ORAL | Status: DC
  Administered 2019-07-25 – 2019-07-26 (×2): 100 mg via ORAL
  Filled 2019-07-25 (×2): qty 4

## 2019-07-25 MED ORDER — SODIUM CHLORIDE 0.9 % IV SOLN
1.0000 g | Freq: Once | INTRAVENOUS | Status: AC
  Administered 2019-07-25: 1 g via INTRAVENOUS
  Filled 2019-07-25: qty 10

## 2019-07-25 MED ORDER — BACLOFEN 10 MG PO TABS
10.0000 mg | ORAL_TABLET | Freq: Three times a day (TID) | ORAL | Status: DC
  Administered 2019-07-25 – 2019-07-28 (×8): 10 mg via ORAL
  Filled 2019-07-25 (×8): qty 1

## 2019-07-25 MED ORDER — FUROSEMIDE 40 MG PO TABS: 40.0000 mg | ORAL_TABLET | Freq: Every day | ORAL | Status: DC

## 2019-07-25 MED ORDER — SODIUM CHLORIDE 0.9 % IV SOLN: 2.0000 g | INTRAVENOUS | Status: DC

## 2019-07-25 MED ORDER — PANTOPRAZOLE SODIUM 40 MG PO TBEC
40.0000 mg | DELAYED_RELEASE_TABLET | Freq: Two times a day (BID) | ORAL | Status: DC
  Administered 2019-07-25 – 2019-07-28 (×6): 40 mg via ORAL
  Filled 2019-07-25 (×6): qty 1

## 2019-07-25 MED ORDER — SODIUM CHLORIDE 0.9 % IV SOLN
2.0000 g | INTRAVENOUS | Status: DC
  Administered 2019-07-26 – 2019-07-27 (×2): 2 g via INTRAVENOUS
  Filled 2019-07-25 (×2): qty 20

## 2019-07-25 NOTE — ED Triage Notes (Signed)
Sent from gastroenterology for recheck of positive culture following paracentesis

## 2019-07-25 NOTE — H&P (Signed)
History and Physical  Dowell Hoon JXB:[id number] DOB: 1965/04/07 DOA: 07/25/2019  Referring physician: Dr Aileen Pilot, ED physician PCP: Jacquelin Hawking, PA-C  Outpatient Specialists: Jena Gauss (GI)  Patient Coming From: home  Chief Complaint: Abnormal cultures  HPI: Alan Phillips is a 54 y.o. male with a history of cirrhosis with ascites, chronic hyponatremia, anasarca.  Patient was sent to the hospital by Dr. Jena Gauss, his GI doctor, for bacteria growing in peritoneal fluid.  Patient had a paracentesis done on 5/18.  The fluid was sent off for culture, which started growing gram-negative rods and cultured as E. coli.  Repeat sample was taken yesterday and is negative thus far.  Dr. Jena Gauss spoke with infectious disease, who recommended IV antibiotics and to treat this like spontaneous bacterial peritonitis.  The patient denies fevers, chills, nausea, vomiting, abdominal pain.  He has abdominal distention and tenderness around his umbilicus, but this is secondary to significant ascites.  No palliating or provoking factors.  Emergency Department Course: Labs show sodium of 121 (baseline 127), bilirubin 6.6 (baseline 6), white count 7.7.  Review of Systems:   Pt denies any fevers, chills, nausea, vomiting, diarrhea, constipation, abdominal pain, shortness of breath, dyspnea on exertion, orthopnea, cough, wheezing, palpitations, headache, vision changes, lightheadedness, dizziness, melena, rectal bleeding.  Review of systems are otherwise negative  Past Medical History:  Diagnosis Date  . Cirrhosis of liver (HCC)   . Depression   . Psoriasis    Past Surgical History:  Procedure Laterality Date  . BIOPSY  06/20/2019   Procedure: BIOPSY;  Surgeon: West Bali, MD;  Location: [facility];  Service: Endoscopy;;  gastric  . ESOPHAGEAL BANDING N/A 06/20/2019   Procedure: ESOPHAGEAL BANDING;  Surgeon: West Bali, MD;  Location: [facility];  Service: Endoscopy;  Laterality: N/A;  .  ESOPHAGOGASTRODUODENOSCOPY (EGD) WITH PROPOFOL N/A 06/20/2019   Procedure: ESOPHAGOGASTRODUODENOSCOPY (EGD) WITH PROPOFOL;  Surgeon: West Bali, MD;  Location: [facility];  Service: Endoscopy;  Laterality: N/A;  . none     Social History:  reports that he quit smoking about 7 months ago. His smoking use included cigarettes. He has a 21.00 pack-year smoking history. He quit smokeless tobacco use about 33 years ago. He reports previous alcohol use. He reports current drug use. Drug: Marijuana. Patient lives at home  Allergies  Allergen Reactions  . Codeine Hives    Family History  Problem Relation Age of Onset  . Cirrhosis Paternal Uncle        etoh  . Cancer Paternal Uncle   . Alcohol abuse Paternal Uncle   . Alzheimer's disease Father   . Heart failure Maternal Uncle       Prior to Admission medications   Medication Sig Start Date End Date Taking? Authorizing Provider  baclofen (LIORESAL) 10 MG tablet Take 1 tablet (10 mg total) by mouth 3 (three) times daily. 07/23/19 08/22/19 Yes Tiffany Kocher, PA-C  furosemide (LASIX) 40 MG tablet Take 40 mg by mouth daily. 07/23/19  Yes [provider]  Lactulose 20 GM/30ML SOLN Take 30 mLs (20 g total) by mouth in the morning and at bedtime. 06/17/19  Yes Tiffany Kocher, PA-C  pantoprazole (PROTONIX) 40 MG tablet Take 1 tablet (40 mg total) by mouth 2 (two) times daily. 06/21/19 06/20/20 Yes Erick Blinks, MD  spironolactone (ALDACTONE) 100 MG tablet Take 1 tablet (100 mg total) by mouth daily. 07/23/19  Yes Tiffany Kocher, PA-C    Physical Exam: BP 119/82  Pulse (!) 108   Temp 98.2 F (36.8 C)   Resp 10   SpO2 100%   . General: Middle-age male. Awake and alert and oriented x3. No acute cardiopulmonary distress.  Marland Kitchen HEENT: Normocephalic atraumatic.  Right and left ears normal in appearance.  Pupils equal, round, reactive to light. Extraocular muscles are intact. Sclerae anicteric and noninjected.  Moist mucosal membranes.  No mucosal lesions.  . Neck: Neck supple without lymphadenopathy. No carotid bruits. No masses palpated.  . Cardiovascular: Regular rate with normal S1-S2 sounds. No murmurs, rubs, gallops auscultated. No JVD.  Marland Kitchen Respiratory: Good respiratory effort with no wheezes, rales, rhonchi. Lungs clear to auscultation bilaterally.  No accessory muscle use. . Abdomen: Soft, nontender.  Significantly distended and hypertympanic. Active bowel sounds. No masses or hepatosplenomegaly  . Skin: No rashes, lesions, or ulcerations.  Dry, warm to touch. 2+ dorsalis pedis and radial pulses. . Musculoskeletal: No calf or leg pain. All major joints not erythematous nontender.  No upper or lower joint deformation.  Good ROM.  No contractures  . Psychiatric: Intact judgment and insight. Pleasant and cooperative. . Neurologic: No focal neurological deficits. Strength is 5/5 and symmetric in upper and lower extremities.  Cranial nerves II through XII are grossly intact.           Labs on Admission: I have personally reviewed following labs and imaging studies  CBC: Recent Labs  Lab 07/25/19 1608  WBC 7.7  NEUTROABS 4.8  HGB 8.9*  HCT 25.8*  MCV 107.9*  PLT 401   Basic Metabolic Panel: Recent Labs  Lab 07/25/19 1608  NA 121*  K 4.3  CL 90*  CO2 21*  GLUCOSE 128*  BUN 24*  CREATININE 1.07  CALCIUM 7.9*   GFR: CrCl cannot be calculated (Unknown ideal weight.). Liver Function Tests: Recent Labs  Lab 07/25/19 1608  AST 48*  ALT 23  ALKPHOS 83  BILITOT 6.6*  PROT 6.7  ALBUMIN 2.0*   No results for input(s): LIPASE, AMYLASE in the last 168 hours. No results for input(s): AMMONIA in the last 168 hours. Coagulation Profile: No results for input(s): INR, PROTIME in the last 168 hours. Cardiac Enzymes: No results for input(s): CKTOTAL, CKMB, CKMBINDEX, TROPONINI in the last 168 hours. BNP (last 3 results) No results for input(s): PROBNP in the last 8760 hours. HbA1C: No results for input(s):  HGBA1C in the last 72 hours. CBG: No results for input(s): GLUCAP in the last 168 hours. Lipid Profile: No results for input(s): CHOL, HDL, LDLCALC, TRIG, CHOLHDL, LDLDIRECT in the last 72 hours. Thyroid Function Tests: No results for input(s): TSH, T4TOTAL, FREET4, T3FREE, THYROIDAB in the last 72 hours. Anemia Panel: No results for input(s): VITAMINB12, FOLATE, FERRITIN, TIBC, IRON, RETICCTPCT in the last 72 hours. Urine analysis:    Component Value Date/Time   COLORURINE YELLOW 07/25/2019 1700   APPEARANCEUR CLEAR 07/25/2019 1700   LABSPEC 1.012 07/25/2019 1700   PHURINE 5.0 07/25/2019 1700   GLUCOSEU NEGATIVE 07/25/2019 1700   HGBUR MODERATE (A) 07/25/2019 1700   BILIRUBINUR NEGATIVE 07/25/2019 1700   KETONESUR NEGATIVE 07/25/2019 1700   PROTEINUR NEGATIVE 07/25/2019 1700   NITRITE NEGATIVE 07/25/2019 1700   LEUKOCYTESUR NEGATIVE 07/25/2019 1700   Sepsis Labs: @LABRCNTIP (procalcitonin:4,lacticidven:4) ) Recent Results (from the past 240 hour(s))  Culture, body fluid-bottle     Status: Abnormal   Collection Time: 07/22/19  1:34 PM   Specimen: Ascitic  Result Value Ref Range Status   Specimen Description   Final  ASCITIC Performed at [facility], [address] [place], [place] [address]    Special Requests   Final    BOTTLES DRAWN AEROBIC AND ANAEROBIC 10CC Performed at [facility], [address], [place], [place] [address]    Gram Stain   Final    ANAEROBIC BOTTLE ONLY GRAM NEGATIVE RODS Gram Stain Report Called to,Read Back By and Verified With: LATHRUM,L @ [id number] @[phone] BY MATTHEWS, B 5.19.21    Culture ESCHERICHIA COLI (A)  Final   Report Status 07/25/2019 FINAL  Final   Organism ID, Bacteria ESCHERICHIA COLI  Final      Susceptibility   Escherichia coli - MIC*    AMPICILLIN <=2 SENSITIVE Sensitive     CEFAZOLIN <=4 SENSITIVE Sensitive     CEFEPIME <=1 SENSITIVE Sensitive     CEFTAZIDIME <=1 SENSITIVE Sensitive      CEFTRIAXONE <=1 SENSITIVE Sensitive     CIPROFLOXACIN <=0.25 SENSITIVE Sensitive     GENTAMICIN <=1 SENSITIVE Sensitive     IMIPENEM <=0.25 SENSITIVE Sensitive     TRIMETH/SULFA <=20 SENSITIVE Sensitive     AMPICILLIN/SULBACTAM <=2 SENSITIVE Sensitive     PIP/TAZO <=4 SENSITIVE Sensitive     * ESCHERICHIA COLI  Gram stain     Status: None   Collection Time: 07/22/19  1:34 PM   Specimen: Ascitic  Result Value Ref Range Status   Specimen Description ASCITIC  Final   Special Requests NONE  Final   Gram Stain   Final    NO ORGANISMS SEEN WBC PRESENT, PREDOMINANTLY MONONUCLEAR CYTOSPIN SMEAR [place] Performed at [facility], [address], [place], [place]    Report Status 07/22/2019 FINAL  Final  Culture, body fluid-bottle     Status: None (Preliminary result)   Collection Time: 07/24/19  1:51 PM   Specimen: Ascitic  Result Value Ref Range Status   Specimen Description ASCITIC  Final   Special Requests BOTTLES DRAWN AEROBIC AND ANAEROBIC 10CC  Final   Culture   Final    NO GROWTH < 24 HOURS Performed at [facility], [address], [place], [place]    Report Status PENDING  Incomplete  Gram stain     Status: None   Collection Time: 07/24/19  1:56 PM   Specimen: Ascitic  Result Value Ref Range Status   Specimen Description ASCITIC  Final   Special Requests NONE  Final   Gram Stain   Final    NO ORGANISMS SEEN CYTOSPIN SMEAR WBC PRESENT, PREDOMINANTLY MONONUCLEAR [facility] Performed at [facility], [address] [place], [place]    Report Status 07/24/2019 FINAL  Final     Radiological Exams on Admission: 07/26/2019 Paracentesis  Result Date: 07/24/2019 INDICATION: Cirrhosis, positive culture at prior paracentesis, reassessment EXAM: ULTRASOUND GUIDED DIAGNOSTIC PARACENTESIS MEDICATIONS: None COMPLICATIONS: None immediate PROCEDURE: Informed written consent was obtained from the patient after a discussion of the risks, benefits and  alternatives to treatment. A timeout was performed prior to the initiation of the procedure. Initial ultrasound scanning demonstrates a large amount of ascites within the right lower abdominal quadrant. The right lower abdomen was prepped and draped in the usual sterile fashion. 1% lidocaine was used for local anesthesia. Following this, a 5 07/26/2019 Yueh catheter was introduced. An ultrasound image was saved for documentation purposes. The paracentesis was performed. The catheter was removed and a dressing was applied. The patient tolerated the procedure well without immediate post procedural complication. FINDINGS: A total of  approximately 180 mL of yellow ascitic fluid was removed. Samples were sent to the laboratory as requested by the clinical team. IMPRESSION: Successful ultrasound-guided paracentesis yielding 180 mL of peritoneal fluid. Electronically Signed   By: Ulyses Southward M.D.   On: 07/24/2019 15:23   DG Chest Port 1 View  Result Date: 07/25/2019 CLINICAL DATA:  Shortness of breath today, history cirrhosis, positive cultures following paracentesis on 07/22/2019 EXAM: PORTABLE CHEST 1 VIEW COMPARISON:  Portable exam 1541 hours compared to 06/18/2019 FINDINGS: Normal heart size, mediastinal contours, and pulmonary vascularity. Persistent LEFT pleural effusion and basilar atelectasis. Remaining lungs clear. No infiltrate or pneumothorax. IMPRESSION: Persistent small LEFT pleural effusion and LEFT basilar atelectasis. Electronically Signed   By: Ulyses Southward M.D.   On: 07/25/2019 15:50    EKG: Independently reviewed.  Pending  Assessment/Plan: Principal Problem:   SBP (spontaneous bacterial peritonitis) (HCC) Active Problems:   Hepatic cirrhosis (HCC)   Anasarca   Hyponatremia   Hypokalemia   Prolonged QT interval    This patient was discussed with the ED physician, including pertinent vitals, physical exam findings, labs, and imaging.  We also discussed care given by the ED  provider.  1. SBP with hepatic cirrhosis a. Admit b. Blood cultures c. Rocephin d. Repeat blood count in the morning 2. Hyponatremia a. Hypervolemic b. We will give Lasix to diurese c. Fluid restrict to 1200 mL d. Strict I's and O's e. If this is not effective at controlling sodium, consider potassium f. Check sodium level this evening and tomorrow morning 3. Anasarca a. Lasix and spironolactone 4. Prolonged QT a. Check EKG  DVT prophylaxis: INR pending, Consultants: GI Code Status: Full code Family Communication: Wife present during interview and exam Disposition Plan: Patient should be able to return home following    Levie Heritage, DO

## 2019-07-25 NOTE — ED Provider Notes (Signed)
[facility] EMERGENCY DEPARTMENT Provider Note   CSN: [id number] Arrival date & time: 07/25/19  1444     History Chief Complaint  Patient presents with  . Follow-up    Alan Phillips is a 54 y.o. male.  Patient sent from the gastroenterologist office because he had a positive culture from his peritoneal tap yesterday.  Patient complains of fullness in belly mild tenderness  The history is provided by the patient. No language interpreter was used.  Weakness Severity:  Mild Onset quality:  Gradual Timing:  Constant Progression:  Waxing and waning Chronicity:  Recurrent Context: not alcohol use   Relieved by:  Nothing Worsened by:  Nothing Ineffective treatments:  None tried Associated symptoms: abdominal pain   Associated symptoms: no chest pain, no cough, no diarrhea, no frequency, no headaches and no seizures   Risk factors: no anemia        Past Medical History:  Diagnosis Date  . Cirrhosis of liver (HCC)   . Depression   . Psoriasis     Patient Active Problem List   Diagnosis Date Noted  . Acute GI bleeding 06/19/2019  . Hematemesis 06/18/2019  . Hyponatremia   . Hypokalemia   . Macrocytic anemia   . Coagulopathy (HCC)   . Prolonged QT interval   . Hepatic cirrhosis (HCC) 05/14/2019  . Anasarca 05/14/2019    Past Surgical History:  Procedure Laterality Date  . BIOPSY  06/20/2019   Procedure: BIOPSY;  Surgeon: West Bali, MD;  Location: [facility];  Service: Endoscopy;;  gastric  . ESOPHAGEAL BANDING N/A 06/20/2019   Procedure: ESOPHAGEAL BANDING;  Surgeon: West Bali, MD;  Location: [facility];  Service: Endoscopy;  Laterality: N/A;  . ESOPHAGOGASTRODUODENOSCOPY (EGD) WITH PROPOFOL N/A 06/20/2019   Procedure: ESOPHAGOGASTRODUODENOSCOPY (EGD) WITH PROPOFOL;  Surgeon: West Bali, MD;  Location: [facility];  Service: Endoscopy;  Laterality: N/A;  . none         Family History  Problem Relation Age of Onset  . Cirrhosis  Paternal Uncle        etoh  . Cancer Paternal Uncle   . Alcohol abuse Paternal Uncle   . Alzheimer's disease Father   . Heart failure Maternal Uncle     Social History   Tobacco Use  . Smoking status: Former Smoker    Packs/day: 0.50    Years: 42.00    Pack years: 21.00    Types: Cigarettes    Quit date: 12/05/2018    Years since quitting: 0.6  . Smokeless tobacco: Former Neurosurgeon    Quit date: 05/14/1986  . Tobacco comment: quit last year  Substance Use Topics  . Alcohol use: Not Currently    Comment: h/o longtime etoh abuse but quit 04/2019 when he found out he has cirrhosis (05/14/19)  . Drug use: Yes    Types: Marijuana    Home Medications Prior to Admission medications   Medication Sig Start Date End Date Taking? Authorizing Provider  baclofen (LIORESAL) 10 MG tablet Take 1 tablet (10 mg total) by mouth 3 (three) times daily. 07/23/19 08/22/19  Tiffany Kocher, PA-C  Lactulose 20 GM/30ML SOLN Take 30 mLs (20 g total) by mouth in the morning and at bedtime. 06/17/19   Tiffany Kocher, PA-C  pantoprazole (PROTONIX) 40 MG tablet Take 1 tablet (40 mg total) by mouth 2 (two) times daily. 06/21/19 06/20/20  Erick Blinks, MD  spironolactone (ALDACTONE) 100 MG tablet Take 1 tablet (100 mg total)  by mouth daily. 07/23/19   Mahala Menghini, PA-C    Allergies    Codeine  Review of Systems   Review of Systems  Constitutional: Negative for appetite change and fatigue.  HENT: Negative for congestion, ear discharge and sinus pressure.   Eyes: Negative for discharge.  Respiratory: Negative for cough.   Cardiovascular: Negative for chest pain.  Gastrointestinal: Positive for abdominal pain. Negative for diarrhea.  Genitourinary: Negative for frequency and hematuria.  Musculoskeletal: Negative for back pain.  Skin: Negative for rash.  Neurological: Positive for weakness. Negative for seizures and headaches.  Psychiatric/Behavioral: Negative for hallucinations.    Physical Exam Updated  Vital Signs BP 122/80   Pulse (!) 104   Temp 98.2 F (36.8 C)   Resp 11   SpO2 100%   Physical Exam Vitals and nursing note reviewed.  Constitutional:      Appearance: He is well-developed.  HENT:     Head: Normocephalic.     Nose: Nose normal.  Eyes:     General: No scleral icterus.    Conjunctiva/sclera: Conjunctivae normal.  Neck:     Thyroid: No thyromegaly.  Cardiovascular:     Rate and Rhythm: Normal rate and regular rhythm.     Heart sounds: No murmur. No friction rub. No gallop.   Pulmonary:     Breath sounds: No stridor. No wheezing or rales.  Chest:     Chest wall: No tenderness.  Abdominal:     General: There is no distension.     Tenderness: There is abdominal tenderness. There is no rebound.  Musculoskeletal:        General: Normal range of motion.     Cervical back: Neck supple.  Lymphadenopathy:     Cervical: No cervical adenopathy.  Skin:    Findings: No erythema or rash.  Neurological:     Mental Status: He is alert and oriented to person, place, and time.     Motor: No abnormal muscle tone.     Coordination: Coordination normal.  Psychiatric:        Behavior: Behavior normal.     ED Results / Procedures / Treatments   Labs (all labs ordered are listed, but only abnormal results are displayed) Labs Reviewed  CBC WITH DIFFERENTIAL/PLATELET - Abnormal; Notable for the following components:      Result Value   RBC 2.39 (*)    Hemoglobin 8.9 (*)    HCT 25.8 (*)    MCV 107.9 (*)    MCH 37.2 (*)    Monocytes Absolute 1.6 (*)    Abs Immature Granulocytes 0.09 (*)    All other components within normal limits  COMPREHENSIVE METABOLIC PANEL - Abnormal; Notable for the following components:   Sodium 121 (*)    Chloride 90 (*)    CO2 21 (*)    Glucose, Bld 128 (*)    BUN 24 (*)    Calcium 7.9 (*)    Albumin 2.0 (*)    AST 48 (*)    Total Bilirubin 6.6 (*)    All other components within normal limits  URINALYSIS, ROUTINE W REFLEX MICROSCOPIC  - Abnormal; Notable for the following components:   Hgb urine dipstick MODERATE (*)    All other components within normal limits  CULTURE, BLOOD (ROUTINE X 2)  CULTURE, BLOOD (ROUTINE X 2)    EKG None  Radiology US Paracentesis  Result Date: 07/24/2019 INDICATION: Cirrhosis, positive culture at prior paracentesis, reassessment EXAM: ULTRASOUND GUIDED DIAGNOSTIC PARACENTESIS  MEDICATIONS: None COMPLICATIONS: None immediate PROCEDURE: Informed written consent was obtained from the patient after a discussion of the risks, benefits and alternatives to treatment. A timeout was performed prior to the initiation of the procedure. Initial ultrasound scanning demonstrates a large amount of ascites within the right lower abdominal quadrant. The right lower abdomen was prepped and draped in the usual sterile fashion. 1% lidocaine was used for local anesthesia. Following this, a 5 [place] Yueh catheter was introduced. An ultrasound image was saved for documentation purposes. The paracentesis was performed. The catheter was removed and a dressing was applied. The patient tolerated the procedure well without immediate post procedural complication. FINDINGS: A total of approximately 180 mL of yellow ascitic fluid was removed. Samples were sent to the laboratory as requested by the clinical team. IMPRESSION: Successful ultrasound-guided paracentesis yielding 180 mL of peritoneal fluid. Electronically Signed   By: Ulyses Southward M.D.   On: 07/24/2019 15:23   DG Chest Port 1 View  Result Date: 07/25/2019 CLINICAL DATA:  Shortness of breath today, history cirrhosis, positive cultures following paracentesis on 07/22/2019 EXAM: PORTABLE CHEST 1 VIEW COMPARISON:  Portable exam 1541 hours compared to 06/18/2019 FINDINGS: Normal heart size, mediastinal contours, and pulmonary vascularity. Persistent LEFT pleural effusion and basilar atelectasis. Remaining lungs clear. No infiltrate or pneumothorax. IMPRESSION: Persistent small  LEFT pleural effusion and LEFT basilar atelectasis. Electronically Signed   By: Ulyses Southward M.D.   On: 07/25/2019 15:50    Procedures Procedures (including critical care time)  Medications Ordered in ED Medications - No data to display  ED Course  I have reviewed the triage vital signs and the nursing notes.  Pertinent labs & imaging results that were available during my care of the patient were reviewed by me and considered in my medical decision making (see chart for details).    MDM Rules/Calculators/A&P                      Patient with a positive culture from her peritoneal tap.  He will be admitted for IV antibiotics with GI follow      This patient presents to the ED for concern of abdominal pain, this involves an extensive number of treatment options, and is a complaint that carries with it a high risk of complications and morbidity.  The differential diagnosis includes cirrhosis SBP   Lab Tests:   I Ordered, reviewed, and interpreted labs, which included CBC and chemistries which showed anemia and hyponatremia  Medicines ordered:   I ordered medication Rocephin for SBP  Imaging Studies ordered:   Additional history obtained:   Additional history obtained from GI provider note  Previous records obtained and reviewed   Consultations Obtained:   I consulted hospitalist and discussed lab and imaging findings  Reevaluation:  After the interventions stated above, I reevaluated the patient and found no change  Critical Interventions:  .   Final Clinical Impression(s) / ED Diagnoses Final diagnoses:  None    Rx / DC Orders ED Discharge Orders    None       Bethann Berkshire, MD 07/25/19 1738

## 2019-07-25 NOTE — Telephone Encounter (Signed)
Communication noted.  

## 2019-07-25 NOTE — Telephone Encounter (Signed)
Patient had abdominal paracentesis on Jul 22, 2019 which had unremarkable cell count.  The anaerobic bottle grew gram-negative rods.  Clinically patient felt well and we elected to repeat cell count with culture 48 hours later to see if there was any increased in absolute neutrophils.  Repeat abdominal paracentesis (diagnostic) May 20.  Cloudy fluid but absolute neutrophil count unremarkable.  Subsequently the first culture came back E. coli.    I contacted infectious disease specialist on call, Dr. Jerolyn Center.  Dr. Ilsa Iha recommends treating for possible SBP based on culture growing E. coli, likely pathogen rather than contaminant at this point.  She suspects we have call her SBP early in the reason for unremarkable cell count and patient being asymptomatic.  Contacted patient's wife to give report.  Would advise him to go to ED for admission for SBP as he will need IV antibiotic therapy initially.  ED has been updated.

## 2019-07-26 DIAGNOSIS — E876 Hypokalemia: Secondary | ICD-10-CM

## 2019-07-26 DIAGNOSIS — R601 Generalized edema: Secondary | ICD-10-CM

## 2019-07-26 DIAGNOSIS — K7460 Unspecified cirrhosis of liver: Secondary | ICD-10-CM

## 2019-07-26 DIAGNOSIS — K652 Spontaneous bacterial peritonitis: Secondary | ICD-10-CM

## 2019-07-26 DIAGNOSIS — E871 Hypo-osmolality and hyponatremia: Secondary | ICD-10-CM

## 2019-07-26 DIAGNOSIS — K7031 Alcoholic cirrhosis of liver with ascites: Secondary | ICD-10-CM

## 2019-07-26 MED ORDER — ONDANSETRON HCL 4 MG/2ML IJ SOLN: 4.0000 mg | Freq: Four times a day (QID) | INTRAMUSCULAR | Status: DC | PRN

## 2019-07-26 MED ORDER — ALBUMIN HUMAN 25 % IV SOLN
50.0000 g | Freq: Every day | INTRAVENOUS | Status: AC
  Administered 2019-07-26 – 2019-07-28 (×3): 50 g via INTRAVENOUS
  Filled 2019-07-26 (×3): qty 200

## 2019-07-26 MED ORDER — SPIRONOLACTONE 100 MG PO TABS
100.0000 mg | ORAL_TABLET | Freq: Two times a day (BID) | ORAL | Status: DC
  Administered 2019-07-26 – 2019-07-28 (×4): 100 mg via ORAL
  Filled 2019-07-26 (×4): qty 1

## 2019-07-26 MED ORDER — ACETAMINOPHEN 325 MG PO TABS: 650.0000 mg | ORAL_TABLET | Freq: Three times a day (TID) | ORAL | Status: DC | PRN

## 2019-07-26 NOTE — Consult Note (Signed)
Referring Provider: Catarina Hartshorn, DO Primary Care Physician:  Jacquelin Hawking, PA-C Primary Gastroenterologist:  Dr. Karilyn Cota  Reason for Consultation:    Hematochezia in a patient with decompensated alcoholic liver disease who is receiving IV antibiotics for possible SBP.  HPI:   Patient is 54 year old Caucasian male who was diagnosed with alcoholic cirrhosis when he presented in January this year with new onset of ascites.  Since then patient has required abdominal paracenteses every week.  Patient had abdominal tap 4 days ago.  Cell count was low and Gram stain was negative but culture grew E. coli.  Therefore antibiotic therapy was recommended.  Patient had a follow-up diagnostic tap 2 days later and those cultures are negative.  Patient has received second dose of IV ceftriaxone 2 g today.  He has 3 more doses to go. Patient complained of hematochezia to Dr. Onalee Hua Tat hence reason for consultation.  Patient states he has had hemorrhoids for years and every now and then he notices small amount of blood on tissue.  He denies frank rectal bleeding or melena.  He was hospitalized last month for upper GI bleed and underwent EGD by Dr. Andrey Campanile feels revealing grade 1 esophageal varices small sliding hiatal hernia Cameron lesions and portal gastropathy with some bleeding.  Bleeding controlled with application of 2 clips.  Patient is scheduled for EGD on 08/19/2019 by Dr. Kendell Bane.  Patient says he has never undergone colonoscopy.  He was 1 scheduled but backed off in the last minute. Patient says he has good appetite.  His bowels move daily.  He feels cold all the time but he has not had fever.  He denies heartburn or dysphagia.  He also denies abdominal pain.  At home he is on a low-salt diet.  Patient states before he got sick his weight was 185 pounds and now it runs around 192 pounds.  He states his ascites reaccumulated very rapidly after each tap.  Referral to transplant center has been discussed with patient  but not scheduled yet. Patient states he fell in his workshop at home in August or September 2021.  Since then he has not had any alcohol.  He states he has been losing muscle mass ever since.  However he did not lose weight as he began to develop abdominal distention. Patient's wife Ms. Jontue Crumpacker who is at bedside states that he began to drink heavily since he lost his father in 2011.  She states when he was awake he would just drink beer.  He rarely had other types of alcohol.  He smokes cigarettes off and on for 20 years less than a pack a day but quit 8 months ago also. Patient states he has no libido.  He also has days when he has no energy and does not feel like doing anything.  Father was diagnosed with dementia and died in less than a year in his early 52s.  Mother has cirrhosis and has been treated for breast carcinoma.  Her cirrhosis is not due to alcohol.  She is 55 years old.  Patient has a brother age 31 who also drinks too much alcohol.  Patient does not have any sisters.  Patient is married.  This is his second marriage.  He has son and daughter from first marriage and they are in good health.  He has 1 stepson as well who is in good health.  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  His alcohol history is as above.   Past Medical History:  Diagnosis Date  .  Alcoholic cirrhosis of liver ([place])   . Depression   . Psoriasis        He suffered a collapsed lung and broken ribs after a fall 8 years ago but did not require chest tube.  Past Surgical History:  Procedure Laterality Date  . BIOPSY  06/20/2019   Procedure: BIOPSY;  Surgeon: Danie Binder, MD;  [facility]: [facility];  Service: Endoscopy;;  gastric  . ESOPHAGEAL BANDING N/A 06/20/2019   Procedure: ESOPHAGEAL BANDING;  Surgeon: Danie Binder, MD;  [facility];  Service: Endoscopy;  Laterality: N/A;  .  ESOPHAGOGASTRODUODENOSCOPY (EGD) WITH PROPOFOL N/A 06/20/2019   Procedure: ESOPHAGOGASTRODUODENOSCOPY (EGD) WITH PROPOFOL;  Surgeon: Danie Binder, MD;  [facility]: [facility];  Service: Endoscopy;  Laterality: N/A;  . none      Prior to Admission medications   Medication Sig Start Date End Date Taking? Authorizing Provider  baclofen (LIORESAL) 10 MG tablet Take 1 tablet (10 mg total) by mouth 3 (three) times daily. 07/23/19 08/22/19 Yes Mahala Menghini, PA-C  furosemide (LASIX) 40 MG tablet Take 40 mg by mouth daily. 07/23/19  Yes [provider]  Lactulose 20 GM/30ML SOLN Take 30 mLs (20 g total) by mouth in the morning and at bedtime. 06/17/19  Yes Mahala Menghini, PA-C  pantoprazole (PROTONIX) 40 MG tablet Take 1 tablet (40 mg total) by mouth 2 (two) times daily. 06/21/19 06/20/20 Yes Kathie Dike, MD  spironolactone (ALDACTONE) 100 MG tablet Take 1 tablet (100 mg total) by mouth daily. 07/23/19  Yes Mahala Menghini, PA-C    Current Facility-Administered Medications  Medication Dose Route Frequency Provider Last Rate Last Admin  . acetaminophen (TYLENOL) tablet 650 mg  650 mg Oral Q8H PRN Tat, David, MD      . albumin human 25 % solution 50 g  50 g Intravenous Daily Kelso Bibby U, MD      . baclofen (LIORESAL) tablet 10 mg  10 mg Oral TID Truett Mainland, DO   10 mg at 07/26/19 0956  . cefTRIAXone (ROCEPHIN) 2 g in sodium chloride 0.9 % 100 mL IVPB  2 g Intravenous Q24H Stinson, Jacob J, DO      . furosemide (LASIX) tablet 40 mg  40 mg Oral Daily Truett Mainland, DO   40 mg at 07/26/19 8182  . lactulose (CHRONULAC) 10 GM/15ML solution 20 g  20 g Oral BID Truett Mainland, DO   20 g at 07/26/19 9937  . ondansetron (ZOFRAN) injection 4 mg  4 mg Intravenous Q6H PRN Tat, David, MD      . pantoprazole (PROTONIX) EC tablet 40 mg  40 mg Oral BID Truett Mainland, DO   40 mg at 07/26/19 0956  . spironolactone (ALDACTONE) tablet 100 mg  100 mg Oral Daily Truett Mainland, DO   100 mg  at 07/26/19 1696    Allergies as of 07/25/2019 - Review Complete 07/25/2019  Allergen Reaction Noted  . Codeine Hives 05/14/2019    Family History  Problem Relation Age of Onset  . Cirrhosis Paternal Uncle        etoh  . Cancer Paternal Uncle   . Alcohol abuse Paternal Uncle   . Alzheimer's disease Father   . Heart failure Maternal Uncle     Social History   Socioeconomic History  .  Marital status: Married    Spouse name: Not on file  . Number of children: Not on file  . Years of education: Not on file  . Highest education level: Not on file  Occupational History  . Not on file  Tobacco Use  . Smoking status: Former Smoker    Packs/day: 0.50    Years: 42.00    Pack years: 21.00    Types: Cigarettes    Quit date: 12/05/2018    Years since quitting: 0.6  . Smokeless tobacco: Former Neurosurgeon    Quit date: 05/14/1986  . Tobacco comment: quit last year  Substance and Sexual Activity  . Alcohol use: Not Currently    Comment: h/o longtime etoh abuse but quit 04/2019 when he found out he has cirrhosis (05/14/19)  . Drug use: Yes    Types: Marijuana  . Sexual activity: Not on file  Other Topics Concern  . Not on file  Social History Narrative  . Not on file   Social Determinants of Health   Financial Resource Strain:   . Difficulty of Paying Living Expenses:   Food Insecurity:   . Worried About Programme researcher, broadcasting/film/video in the Last Year:   . Barista in the Last Year:   Transportation Needs:   . Freight forwarder (Medical):   Marland Kitchen Lack of Transportation (Non-Medical):   Physical Activity:   . Days of Exercise per Week:   . Minutes of Exercise per Session:   Stress:   . Feeling of Stress :   Social Connections:   . Frequency of Communication with Friends and Family:   . Frequency of Social Gatherings with Friends and Family:   . Attends Religious Services:   . Active Member of Clubs or Organizations:   . Attends Banker Meetings:   Marland Kitchen Marital  Status:   Intimate Partner Violence:   . Fear of Current or Ex-Partner:   . Emotionally Abused:   Marland Kitchen Physically Abused:   . Sexually Abused:     Review of Systems: See HPI, otherwise normal ROS  Physical Exam: Temp:  [98 F (36.7 C)-98.8 F (37.1 C)] 98 F (36.7 C) (05/22 0953) Pulse Rate:  [103-121] 110 (05/22 0953) Resp:  [10-24] 18 (05/22 0953) BP: (105-144)/(57-115) 105/76 (05/22 0953) SpO2:  [98 %-100 %] 100 % (05/22 0953)   Patient is alert and in no acute distress. Patient does not have asterixis. He has generalized muscle wasting. Conjunctivae is pink.  Sclera is mildly icteric. Oropharyngeal mucosa is normal.  He has upper dentures in place and he is edentulous and lower jaw. Neck without masses thyromegaly or lymphadenopathy. Cardiac exam with regular rhythm normal S1 and S2.  No murmur gallop noted. Auscultation of lungs reveal vesicular breath sounds bilaterally. Abdomen is distended.  He has small umbilical hernia which is easily reducible.  Fluid thrill is present.  Difficult to palpate or ballot liver or spleen. He has 2-3+ pitting edema involving both lower extremities. He has multiple tattoos over his upper extremities chest as well as mid abdomen.   Lab Results: Recent Labs    07/25/19 1608  WBC 7.7  HGB 8.9*  HCT 25.8*  PLT 232   BMET Recent Labs    07/25/19 1608 07/25/19 2114  NA 121* 121*  K 4.3 4.0  CL 90* 89*  CO2 21* 23  GLUCOSE 128* 135*  BUN 24* 22*  CREATININE 1.07 1.10  CALCIUM 7.9* 8.0*   LFT Recent Labs  07/25/19 1608  PROT 6.7  ALBUMIN 2.0*  AST 48*  ALT 23  ALKPHOS 83  BILITOT 6.6*    Studies/Results: DG Chest Port 1 View  Result Date: 07/25/2019 CLINICAL DATA:  Shortness of breath today, history cirrhosis, positive cultures following paracentesis on 07/22/2019 EXAM: PORTABLE CHEST 1 VIEW COMPARISON:  Portable exam 1541 hours compared to 06/18/2019 FINDINGS: Normal heart size, mediastinal contours, and pulmonary  vascularity. Persistent LEFT pleural effusion and basilar atelectasis. Remaining lungs clear. No infiltrate or pneumothorax. IMPRESSION: Persistent small LEFT pleural effusion and LEFT basilar atelectasis. Electronically Signed   By: Ulyses Southward M.D.   On: 07/25/2019 15:50    Assessment;  Patient is 54 year old Caucasian male who was diagnosed with alcoholic cirrhosis 4 months ago in January this year who is requiring LAVP every week who had abdominal paracenteses 4 days ago and culture was positive for E. coli.  Patient cell count was low.  Patient advised antimicrobial therapy for possible SBP after consultation with ID specialist. Patient also reports hematochezia. Patient's GI issues can be summarized as below.  #1.  Ascitic fluid culture positive for E. coli.  Cell count on the tap revealed 76 WBCs and 2% neutrophils.  Therefore I am not convinced that he has SBP.  Repeat culture from 2 days ago remains negative.  Patient is on ceftriaxone 2 g IV every 24 hours.  He will need total of 5 doses.  #2.  Cirrhotic ascites.  Patient has tense ascites.  He is not responding to diuretic therapy.  There is room for diuretic therapy to escalated as long as hyponatremia does not worsen.  He will need therapeutic tap prior to discharge.  He would also benefit from IV albumin.  #3.  Hematochezia.  He has history of hemorrhoids.  He has not experience copious rectal bleeding.  He has never been screened for CRC.  He should consider a colonoscopy at some point in future.  Maybe he could have colonoscopy at the time of planned EGD next month.  #4.  Anemia.  Hemoglobin has been trending downwards in the last 1 month.  He has a history of upper GI bleed for which he underwent EGD last month.  He may be losing blood due to portal gastropathy.  He is scheduled for EGD next month.  #5.  Hyponatremia.  Suspect hyponatremia dilutional and due to diuretic therapy.  Rather than due to underlying cirrhosis.  #6.   Alcoholic cirrhosis.  He has decompensated alcoholic liver disease.  He has not had any alcohol in 8 months.  Will check INR along with bilirubin in a.m. and update meld score.  Patient is to be referred for transplant evaluation.  He should also consider outpatient rehab. Patient has prescription for hepatitis a and B vaccination given to him by Ms. Tana Coast, PA-C he has not gotten the vaccine yet.  Recommendations;  Albumin 50 g IV daily for 3 days to be followed by furosemide 40 mg daily. Change diet to 2 g sodium. CBC INR metabolic 7 and bilirubin in AM. AFP. Liver Doppler ultrasound on 07/28/2019. Patient reminded to schedule visit at [facility] department to get hepatitis A and B vaccination. Diagnostic colonoscopy on outpatient basis.   LOS: 1 day   Jaimere Feutz  07/26/2019, 2:25 PM

## 2019-07-26 NOTE — Progress Notes (Signed)
PROGRESS NOTE  Alan Phillips DOB: 10-Jun-1965 DOA: 07/25/2019 PCP: Jacquelin Hawking, PA-C  Brief History:  53 year old male with a history of alcoholic liver cirrhosis, chronic hyponatremia, and depression presenting with intermittent abdominal discomfort.  The patient was sent from his gastroenterology office secondary to positive cultures from his ascites.  The patient has been routinely undergoing paracentesis for his recurrent ascites.  Most recently, the patient had a paracentesis on 07/22/2019 which grew E. coli from culture.  WBC was 76 with 98% mononuclear cells.  The patient himself had been feeling fine without any fevers, worsening abdominal pain, nausea, vomiting.  Repeat paracentesis was performed on 07/24/2019.  Cultures are currently negative.  Ascites showed 83 WBC at that time on 07/24/2019.  Because of concerns for SBP, the patient was admitted to the hospital for intravenous antibiotics.  The patient has been having some intermittent hematochezia mostly on the toilet paper.  He denies any melena.  He denies any vomiting, hematemesis, headache, chest pain, shortness breath. In the emergency department, the patient was afebrile hemodynamically stable with oxygen saturation 99% on room air.  Hemoglobin was 8.9 with WBC 7.7 and platelets 232,000.  The patient was started on ceftriaxone.  Assessment/Plan: Spontaneous bacterial peritonitis -Continue ceftriaxone pending final culture data -Follow blood cultures  Alcoholic liver cirrhosis -Continue furosemide and spironolactone -Continue lactulose  Hyponatremia--hypervolemic -The patient has chronic hyponatremia -Continue diuretics -Repeat BMP  Sinus tachycardia -Likely secondary to his acute medical illness -Monitor clinically  Hematochezia -suspect anorectal source -GI consult      Status is: Inpatient  Remains inpatient appropriate because:IV treatments appropriate due to intensity of illness  or inability to take PO   Dispo: The patient is from: Home              Anticipated d/c is to: Home              Anticipated d/c date is: 2 days              Patient currently is not medically stable to d/c.         Family Communication:   Spouse updated at bedside 5/22  Consultants:  GI  Code Status:  FULL /  DVT Prophylaxis:  SCDs   Procedures: As Listed in Progress Note Above  Antibiotics: Ceftriaxone 5/21>>>     Subjective: Patient denies fevers, chills, headache, chest pain, dyspnea, nausea, vomiting, diarrhea, abdominal pain, dysuria, hematuria, and melena.   Objective: Vitals:   07/25/19 2058 07/25/19 2126 07/26/19 0127 07/26/19 0527  BP: 134/90  (!) 106/57 110/69  Pulse: (!) 121 (!) 110 (!) 110 (!) 109  Resp: 15  16 16   Temp: 98.6 F (37 C)  98.8 F (37.1 C) 98.5 F (36.9 C)  TempSrc: Oral  Oral Oral  SpO2: 100% 100% 98% 99%    Intake/Output Summary (Last 24 hours) at 07/26/2019 07/28/2019 Last data filed at 07/25/2019 2031 Gross per 24 hour  Intake 100 ml  Output --  Net 100 ml   Weight change:  Exam:   General:  Pt is alert, follows commands appropriately, not in acute distress  HEENT: No icterus, No thrush, No neck mass, [place]/AT  Cardiovascular: RRR, S1/S2, no rubs, no gallops  Respiratory: bibasilar rales  Abdomen: Soft/+BS, non tender, mod distended, no guarding  Extremities: 2 + LE edema, No lymphangitis, No petechiae, No rashes, no synovitis   Data Reviewed: I have personally reviewed following labs and  imaging studies Basic Metabolic Panel: Recent Labs  Lab 07/25/19 1608 07/25/19 2114  NA 121* 121*  K 4.3 4.0  CL 90* 89*  CO2 21* 23  GLUCOSE 128* 135*  BUN 24* 22*  CREATININE 1.07 1.10  CALCIUM 7.9* 8.0*   Liver Function Tests: Recent Labs  Lab 07/25/19 1608  AST 48*  ALT 23  ALKPHOS 83  BILITOT 6.6*  PROT 6.7  ALBUMIN 2.0*   No results for input(s): LIPASE, AMYLASE in the last 168 hours. No results for  input(s): AMMONIA in the last 168 hours. Coagulation Profile: No results for input(s): INR, PROTIME in the last 168 hours. CBC: Recent Labs  Lab 07/25/19 1608  WBC 7.7  NEUTROABS 4.8  HGB 8.9*  HCT 25.8*  MCV 107.9*  PLT 232   Cardiac Enzymes: No results for input(s): CKTOTAL, CKMB, CKMBINDEX, TROPONINI in the last 168 hours. BNP: Invalid input(s): POCBNP CBG: No results for input(s): GLUCAP in the last 168 hours. HbA1C: No results for input(s): HGBA1C in the last 72 hours. Urine analysis:    Component Value Date/Time   COLORURINE YELLOW 07/25/2019 1700   APPEARANCEUR CLEAR 07/25/2019 1700   LABSPEC 1.012 07/25/2019 1700   PHURINE 5.0 07/25/2019 1700   GLUCOSEU NEGATIVE 07/25/2019 1700   HGBUR MODERATE (A) 07/25/2019 1700   BILIRUBINUR NEGATIVE 07/25/2019 1700   KETONESUR NEGATIVE 07/25/2019 1700   PROTEINUR NEGATIVE 07/25/2019 1700   NITRITE NEGATIVE 07/25/2019 1700   LEUKOCYTESUR NEGATIVE 07/25/2019 1700   Sepsis Labs: @LABRCNTIP (procalcitonin:4,lacticidven:4) ) Recent Results (from the past 240 hour(s))  Culture, body fluid-bottle     Status: Abnormal   Collection Time: 07/22/19  1:34 PM   Specimen: Ascitic  Result Value Ref Range Status   Specimen Description   Final    ASCITIC Performed at [facility], [address], [place], [place]    Special Requests   Final    BOTTLES DRAWN AEROBIC AND ANAEROBIC 10CC Performed at [facility], [address], [place], [place]    Gram Stain   Final    ANAEROBIC BOTTLE ONLY GRAM NEGATIVE RODS Gram Stain Report Called to,Read Back By and Verified With: LATHRUM,L @ [id number] @[phone] BY MATTHEWS, B 5.19.21    Culture ESCHERICHIA COLI (A)  Final   Report Status 07/25/2019 FINAL  Final   Organism ID, Bacteria ESCHERICHIA COLI  Final      Susceptibility   Escherichia coli - MIC*    AMPICILLIN <=2 SENSITIVE Sensitive     CEFAZOLIN <=4 SENSITIVE Sensitive     CEFEPIME <=1  SENSITIVE Sensitive     CEFTAZIDIME <=1 SENSITIVE Sensitive     CEFTRIAXONE <=1 SENSITIVE Sensitive     CIPROFLOXACIN <=0.25 SENSITIVE Sensitive     GENTAMICIN <=1 SENSITIVE Sensitive     IMIPENEM <=0.25 SENSITIVE Sensitive     TRIMETH/SULFA <=20 SENSITIVE Sensitive     AMPICILLIN/SULBACTAM <=2 SENSITIVE Sensitive     PIP/TAZO <=4 SENSITIVE Sensitive     * ESCHERICHIA COLI  Gram stain     Status: None   Collection Time: 07/22/19  1:34 PM   Specimen: Ascitic  Result Value Ref Range Status   Specimen Description ASCITIC  Final   Special Requests NONE  Final   Gram Stain   Final    NO ORGANISMS SEEN WBC PRESENT, PREDOMINANTLY MONONUCLEAR CYTOSPIN SMEAR [place] Performed at [facility], [address] [place], [address] [place]    Report Status 07/22/2019 FINAL  Final  Culture, body fluid-bottle  Status: None (Preliminary result)   Collection Time: 07/24/19  1:51 PM   Specimen: Ascitic  Result Value Ref Range Status   Specimen Description ASCITIC  Final   Special Requests BOTTLES DRAWN AEROBIC AND ANAEROBIC 10CC  Final   Culture   Final    NO GROWTH 2 DAYS Performed at [facility], [address] [place], [place] [address]    Report Status PENDING  Incomplete  Gram stain     Status: None   Collection Time: 07/24/19  1:56 PM   Specimen: Ascitic  Result Value Ref Range Status   Specimen Description ASCITIC  Final   Special Requests NONE  Final   Gram Stain   Final    NO ORGANISMS SEEN CYTOSPIN SMEAR WBC PRESENT, PREDOMINANTLY MONONUCLEAR [facility] Performed at [facility], [address], [place], [place] [address]    Report Status 07/24/2019 FINAL  Final  Blood culture (routine x 2)     Status: None (Preliminary result)   Collection Time: 07/25/19  3:31 PM   Specimen: BLOOD  Result Value Ref Range Status   Specimen Description BLOOD  Final   Special Requests NONE  Final   Culture   Final    NO GROWTH < 24 HOURS Performed at [facility], [address] [place], [place] [address]    Report Status PENDING  Incomplete  Blood culture (routine x 2)     Status: None (Preliminary result)   Collection Time: 07/25/19  4:07 PM   Specimen: BLOOD RIGHT HAND  Result Value Ref Range Status   Specimen Description   Final    BLOOD RIGHT HAND BOTTLES DRAWN AEROBIC AND ANAEROBIC   Special Requests Blood Culture adequate volume  Final   Culture   Final    NO GROWTH < 24 HOURS Performed at [facility], [address] [place], [place] [address]    Report Status PENDING  Incomplete  SARS Coronavirus 2 by RT PCR (hospital order, performed in [facility] hospital lab) Nasopharyngeal Nasopharyngeal Swab     Status: None   Collection Time: 07/25/19  8:44 PM   Specimen: Nasopharyngeal Swab  Result Value Ref Range Status   SARS Coronavirus 2 NEGATIVE NEGATIVE Final    Comment: (NOTE) SARS-CoV-2 target nucleic acids are NOT DETECTED. The SARS-CoV-2 RNA is generally detectable in upper and lower respiratory specimens during the acute phase of infection. The lowest concentration of SARS-CoV-2 viral copies this assay can detect is 250 copies / mL. A negative result does not preclude SARS-CoV-2 infection and should not be used as the sole basis for treatment or other patient management decisions.  A negative result may occur with improper specimen collection / handling, submission of specimen other than nasopharyngeal swab, presence of viral mutation(s) within the areas targeted by this assay, and inadequate number of viral copies (<250 copies / mL). A negative result must be combined with clinical observations, patient history, and epidemiological information. Fact Sheet for Patients:   BoilerBrush.com.cy Fact Sheet for Healthcare Providers: https://pope.com/ This test is not yet approved or cleared  by the [place] FDA and has been authorized for detection and/or diagnosis of SARS-CoV-2 by FDA under  an Emergency Use Authorization (EUA).  This EUA will remain in effect (meaning this test can be used) for the duration of the COVID-19 declaration under Section 564(b)(1) of the Act, 21 U.S.C. section 360bbb-3(b)(1), unless the authorization is terminated or revoked sooner. Performed at [facility], [address], [place], [place]  [address]      Scheduled Meds: . baclofen  10 mg Oral TID  . furosemide  40 mg Oral Daily  . lactulose  20 g Oral BID  . pantoprazole  40 mg Oral BID  . spironolactone  100 mg Oral Daily   Continuous Infusions: . cefTRIAXone (ROCEPHIN)  IV      Procedures/Studies: US Paracentesis  Result Date: 07/24/2019 INDICATION: Cirrhosis, positive culture at prior paracentesis, reassessment EXAM: ULTRASOUND GUIDED DIAGNOSTIC PARACENTESIS MEDICATIONS: None COMPLICATIONS: None immediate PROCEDURE: Informed written consent was obtained from the patient after a discussion of the risks, benefits and alternatives to treatment. A timeout was performed prior to the initiation of the procedure. Initial ultrasound scanning demonstrates a large amount of ascites within the right lower abdominal quadrant. The right lower abdomen was prepped and draped in the usual sterile fashion. 1% lidocaine was used for local anesthesia. Following this, a 5 [place] Yueh catheter was introduced. An ultrasound image was saved for documentation purposes. The paracentesis was performed. The catheter was removed and a dressing was applied. The patient tolerated the procedure well without immediate post procedural complication. FINDINGS: A total of approximately 180 mL of yellow ascitic fluid was removed. Samples were sent to the laboratory as requested by the clinical team. IMPRESSION: Successful ultrasound-guided paracentesis yielding 180 mL of peritoneal fluid. Electronically Signed   By: Lavonia Dana M.D.   On: 07/24/2019 15:23   US Paracentesis  Result Date: 07/22/2019 INDICATION: Cirrhosis, ascites  EXAM: ULTRASOUND GUIDED DIAGNOSTIC AND THERAPEUTIC PARACENTESIS MEDICATIONS: None COMPLICATIONS: None immediate PROCEDURE: Informed written consent was obtained from the patient after a discussion of the risks, benefits and alternatives to treatment. A timeout was performed prior to the initiation of the procedure. Initial ultrasound scanning demonstrates a large amount of ascites within the right lower abdominal quadrant. The right lower abdomen was prepped and draped in the usual sterile fashion. 1% lidocaine was used for local anesthesia. Following this, a 5 [place] Yueh catheter was introduced. An ultrasound image was saved for documentation purposes. The paracentesis was performed. The catheter was removed and a dressing was applied. The patient tolerated the procedure well without immediate post procedural complication. Patient received post-procedure intravenous albumin; see nursing notes for details. FINDINGS: A total of approximately 4.2 L of yellow ascitic fluid was removed. Volume removed was limited to 4 L at patient request, due to not having felt well when larger volumes have been removed previously. Samples were sent to the laboratory as requested by the clinical team. IMPRESSION: Successful ultrasound-guided paracentesis yielding 4.2 liters of peritoneal fluid. Electronically Signed   By: Lavonia Dana M.D.   On: 07/22/2019 14:20   US Paracentesis  Result Date: 07/16/2019 INDICATION: Ascites. EXAM: ULTRASOUND GUIDED therapeutic PARACENTESIS MEDICATIONS: None. COMPLICATIONS: None immediate. PROCEDURE: Informed written consent was obtained from the patient after a discussion of the risks, benefits and alternatives to treatment. A timeout was performed prior to the initiation of the procedure. Initial ultrasound scanning demonstrates a large amount of ascites within the right lower abdominal quadrant. The right lower abdomen was prepped and draped in the usual sterile fashion. 1% lidocaine was used for  local anesthesia. Following this, a paracentesis catheter was introduced. An ultrasound image was saved for documentation purposes. The paracentesis was performed. The catheter was removed and a dressing was applied. The patient tolerated the procedure well without immediate post procedural complication. FINDINGS: A total of approximately 4.2 L of serous fluid was removed. IMPRESSION: Successful ultrasound-guided paracentesis yielding 4.2 liters  of peritoneal fluid. Electronically Signed   By: Lupita RaiderJames  Green Jr M.D.   On: 07/16/2019 14:00   US Paracentesis  Result Date: 07/08/2019 INDICATION: Cirrhosis, ascites EXAM: ULTRASOUND GUIDED DIAGNOSTIC AND THERAPEUTIC PARACENTESIS MEDICATIONS: None COMPLICATIONS: None immediate PROCEDURE: Informed written consent was obtained from the patient after a discussion of the risks, benefits and alternatives to treatment. A timeout was performed prior to the initiation of the procedure. Initial ultrasound scanning demonstrates a large amount of ascites within the right lower abdominal quadrant. The right lower abdomen was prepped and draped in the usual sterile fashion. 1% lidocaine was used for local anesthesia. Following this, a 19 gauge, 7-cm, Yueh catheter was introduced. An ultrasound image was saved for documentation purposes. The paracentesis was performed. The catheter was removed and a dressing was applied. The patient tolerated the procedure well without immediate post procedural complication. FINDINGS: A total of approximately 4.2 L of yellow ascitic fluid was removed. Volume was limited to 4.2 L at patient request, since he felt unwell following removal of 8 L at prior paracentesis. Samples were sent to the laboratory as requested by the clinical team. IMPRESSION: Successful ultrasound-guided paracentesis yielding 4.2 liters of peritoneal fluid. Electronically Signed   By: Ulyses SouthwardMark  Boles M.D.   On: 07/08/2019 15:37   US Paracentesis  Result Date:  06/26/2019 INDICATION: Cirrhosis; ascites EXAM: ULTRASOUND GUIDED LLQ PARACENTESIS MEDICATIONS: 10 cc 1% lidocaine COMPLICATIONS: None immediate. PROCEDURE: Informed written consent was obtained from the patient after a discussion of the risks, benefits and alternatives to treatment. A timeout was performed prior to the initiation of the procedure. Initial ultrasound scanning demonstrates a large amount of ascites within the left lower abdominal quadrant. The left lower abdomen was prepped and draped in the usual sterile fashion. 1% lidocaine was used for local anesthesia. Following this, a 5519 G Yueh catheter was introduced. An ultrasound image was saved for documentation purposes. The paracentesis was performed. The catheter was removed and a dressing was applied. The patient tolerated the procedure well without immediate post procedural complication. Patient received post-procedure intravenous albumin; see nursing notes for details. FINDINGS: A total of approximately 8 liters of yellow fluid was removed. Samples were sent to the laboratory as requested by the clinical team. IMPRESSION: Successful ultrasound-guided paracentesis yielding 8 liters of peritoneal fluid. Read by Robet LeuPamela A Turpin [facility] Electronically Signed   By: Lupita RaiderJames  Green Jr M.D.   On: 06/26/2019 10:26   DG Chest Port 1 View  Result Date: 07/25/2019 CLINICAL DATA:  Shortness of breath today, history cirrhosis, positive cultures following paracentesis on 07/22/2019 EXAM: PORTABLE CHEST 1 VIEW COMPARISON:  Portable exam 1541 hours compared to 06/18/2019 FINDINGS: Normal heart size, mediastinal contours, and pulmonary vascularity. Persistent LEFT pleural effusion and basilar atelectasis. Remaining lungs clear. No infiltrate or pneumothorax. IMPRESSION: Persistent small LEFT pleural effusion and LEFT basilar atelectasis. Electronically Signed   By: Ulyses SouthwardMark  Boles M.D.   On: 07/25/2019 15:50    Catarina Hartshornavid Daquon Greenleaf, DO  [facility]  If 7PM-7AM, please  contact night-coverage [email or web address] Password [email or web address] 07/26/2019, 9:31 AM   LOS: 1 day

## 2019-07-27 DIAGNOSIS — R188 Other ascites: Secondary | ICD-10-CM

## 2019-07-27 DIAGNOSIS — K7460 Unspecified cirrhosis of liver: Secondary | ICD-10-CM

## 2019-07-27 LAB — BASIC METABOLIC PANEL
Anion gap: 9 (ref 5–15)
Anion gap: 8 (ref 5–15)
BUN: 18 mg/dL (ref 6–20)
BUN: 17 mg/dL (ref 6–20)
CO2: 24 mmol/L (ref 22–32)
CO2: 24 mmol/L (ref 22–32)
Calcium: 8.4 mg/dL — ABNORMAL LOW (ref 8.9–10.3)
Calcium: 8.5 mg/dL — ABNORMAL LOW (ref 8.9–10.3)
Chloride: 92 mmol/L — ABNORMAL LOW (ref 98–111)
Chloride: 92 mmol/L — ABNORMAL LOW (ref 98–111)
Creatinine, Ser: 1.03 mg/dL (ref 0.61–1.24)
Creatinine, Ser: 1.17 mg/dL (ref 0.61–1.24)
GFR calc Af Amer: >60 mL/min (ref >60)
GFR calc Af Amer: >60 mL/min (ref >60)
GFR calc non Af Amer: >60 mL/min (ref >60)
GFR calc non Af Amer: >60 mL/min (ref >60)
Glucose, Bld: 99 mg/dL (ref 70–99)
Glucose, Bld: 132 mg/dL — ABNORMAL HIGH (ref 70–99)
Potassium: 3.8 mmol/L (ref 3.5–5.1)
Potassium: 4 mmol/L (ref 3.5–5.1)
Sodium: 125 mmol/L — ABNORMAL LOW (ref 135–145)
Sodium: 124 mmol/L — ABNORMAL LOW (ref 135–145)

## 2019-07-27 LAB — PROTIME-INR
INR: 1.7 — ABNORMAL HIGH (ref 0.8–1.2)
Prothrombin Time: 19.4 seconds — ABNORMAL HIGH (ref 11.4–15.2)

## 2019-07-27 LAB — MAGNESIUM: Magnesium: 1.9 mg/dL (ref 1.7–2.4)

## 2019-07-27 LAB — CBC
HCT: 23.4 % — ABNORMAL LOW (ref 39.0–52.0)
Hemoglobin: 7.9 g/dL — ABNORMAL LOW (ref 13.0–17.0)
MCH: 36.1 pg — ABNORMAL HIGH (ref 26.0–34.0)
MCHC: 33.8 g/dL (ref 30.0–36.0)
MCV: 106.8 fL — ABNORMAL HIGH (ref 80.0–100.0)
Platelets: 234 10*3/uL (ref 150–400)
RBC: 2.19 MIL/uL — ABNORMAL LOW (ref 4.22–5.81)
RDW: 15 % (ref 11.5–15.5)
WBC: 7.4 10*3/uL (ref 4.0–10.5)
nRBC: 0 % (ref 0.0–0.2)

## 2019-07-27 LAB — BILIRUBIN, TOTAL: Total Bilirubin: 4.9 mg/dL — ABNORMAL HIGH (ref 0.3–1.2)

## 2019-07-27 MED ORDER — MAGNESIUM SULFATE IN D5W 1-5 GM/100ML-% IV SOLN
1.0000 g | Freq: Once | INTRAVENOUS | Status: AC
  Administered 2019-07-27: 1 g via INTRAVENOUS
  Filled 2019-07-27 (×2): qty 100

## 2019-07-27 MED ORDER — LACTULOSE 10 GM/15ML PO SOLN
10.0000 g | Freq: Two times a day (BID) | ORAL | Status: DC
  Administered 2019-07-27 – 2019-07-28 (×2): 10 g via ORAL
  Filled 2019-07-27 (×2): qty 30

## 2019-07-27 MED ORDER — FUROSEMIDE 10 MG/ML IJ SOLN
20.0000 mg | Freq: Once | INTRAMUSCULAR | Status: AC
  Administered 2019-07-27: 20 mg via INTRAVENOUS
  Filled 2019-07-27: qty 2

## 2019-07-27 NOTE — Progress Notes (Signed)
Subjective:  Patient feels better.  He feels his ascites has decreased and so has lower extremity edema.  He feels he is responding to therapy.  He says he is having loose stools.  Stool is not black or red.  He did notice blood on the tissue yesterday morning but none thereafter.  He states he has had hemorrhoids for years.  Objective: Blood pressure 103/66, pulse (!) 110, temperature 98 F (36.7 C), temperature source Oral, resp. rate 16, weight 86.6 kg, SpO2 99 %. Patient is alert. He does not have asterixis. Sclera remains icteric. Abdomen is less distended.  Small umbilical hernia is unchanged.  On palpation abdomen is soft and nontender.  Fluid-filled remains. Lower extremity edema has decreased and now is about 2+.  Labs/studies Results:  CBC Latest Ref Rng & Units 07/27/2019 07/25/2019 06/21/2019  WBC 4.0 - 10.5 K/uL 7.4 7.7 8.9  Hemoglobin 13.0 - 17.0 g/dL 7.9(L) 8.9(L) 10.4(L)  Hematocrit 39.0 - 52.0 % 23.4(L) 25.8(L) 30.7(L)  Platelets 150 - 400 K/uL 234 232 175    CMP Latest Ref Rng & Units 07/27/2019 07/25/2019 07/25/2019  Glucose 70 - 99 mg/dL 99 135(H) 128(H)  BUN 6 - 20 mg/dL 18 22(H) 24(H)  Creatinine 0.61 - 1.24 mg/dL 1.03 1.10 1.07  Sodium 135 - 145 mmol/L 125(L) 121(L) 121(L)  Potassium 3.5 - 5.1 mmol/L 3.8 4.0 4.3  Chloride 98 - 111 mmol/L 92(L) 89(L) 90(L)  CO2 22 - 32 mmol/L 24 23 21(L)  Calcium 8.9 - 10.3 mg/dL 8.4(L) 8.0(L) 7.9(L)  Total Protein 6.5 - 8.1 g/dL - - 6.7  Total Bilirubin 0.3 - 1.2 mg/dL 4.9(H) - 6.6(H)  Alkaline Phos 38 - 126 U/L - - 83  AST 15 - 41 U/L - - 48(H)  ALT 0 - 44 U/L - - 23    Hepatic Function Latest Ref Rng & Units 07/27/2019 07/25/2019 06/21/2019  Total Protein 6.5 - 8.1 g/dL - 6.7 6.0(L)  Albumin 3.5 - 5.0 g/dL - 2.0(L) 2.4(L)  AST 15 - 41 U/L - 48(H) 55(H)  ALT 0 - 44 U/L - 23 28  Alk Phosphatase 38 - 126 U/L - 83 64  Total Bilirubin 0.3 - 1.2 mg/dL 4.9(H) 6.6(H) 6.2(H)    INR 1.7.  Ascitic fluid cultures from a tap done 3  days ago i.e. 07/24/2019 are negative.  Assessment:  #1.  Spontaneous bacterial peritonitis based on positive ascitic fluid cultures for E. coli on 07/22/2019.  Repeat culture prior to initiating antibiotic therapy is negative.  Day 3 on IV ceftriaxone.  He will receive 2 more days of IV ceftriaxone.  He will also need to be on secondary prophylaxis.  #2.  Decompensated alcoholic cirrhosis.  Meld score is high at 27.  Hopefully he can be seen at transplant center soon.  #3.  Cirrhotic ascites.  He has required large-volume abdominal paracenteses every 4 weeks.  Spironolactone dose has been doubled.  If ascites cannot be managed with medical therapy he would be a candidate for TIPS.  #4.  Anemia.  Patient's hemoglobin has been gradually decreasing.  He is not having melena or frank rectal bleeding.  We will asked nursing staff to examine his stool and document that he is not having melena or frank rectal bleeding. Patient will need diagnostic colonoscopy at some point in future.  #5.  Hyponatremia.  Serum sodium actually has improved with diuresis which is reassuring.  Recommendations  Decrease lactulose to 10 g p.o. twice daily. Patient reminded not to  discard urine before volume determined. Continue albumin infusion followed by furosemide. Continue spironolactone at current dose of 100 mg twice daily. CBC and metabolic 7 in a.m. Liver Doppler on 07/28/2019.

## 2019-07-27 NOTE — Progress Notes (Signed)
PROGRESS NOTE  Alan Phillips MWU:[id number] DOB: 1965-05-22 DOA: 07/25/2019 PCP: Jacquelin Hawking, PA-C  Brief History:  54 year old male with a history of alcoholic liver cirrhosis, chronic hyponatremia, and depression presenting with intermittent abdominal discomfort.  The patient was sent from his gastroenterology office secondary to positive cultures from his ascites.  The patient has been routinely undergoing paracentesis for his recurrent ascites.  Most recently, the patient had a paracentesis on 07/22/2019 which grew E. coli from culture.  WBC was 76 with 98% mononuclear cells.  The patient himself had been feeling fine without any fevers, worsening abdominal pain, nausea, vomiting.  Repeat paracentesis was performed on 07/24/2019.  Cultures are currently negative.  Ascites showed 83 WBC at that time on 07/24/2019.  Because of concerns for SBP, the patient was admitted to the hospital for intravenous antibiotics.  The patient has been having some intermittent hematochezia mostly on the toilet paper.  He denies any melena.  He denies any vomiting, hematemesis, headache, chest pain, shortness breath. In the emergency department, the patient was afebrile hemodynamically stable with oxygen saturation 99% on room air.  Hemoglobin was 8.9 with WBC 7.7 and platelets 232,000.  The patient was started on ceftriaxone.  Assessment/Plan: Spontaneous bacterial peritonitis -Continue ceftriaxone -plan to d/c home with amoxil -Follow blood cultures--neg to date  Alcoholic liver cirrhosis -Continue furosemide and spironolactone -Continue lactulose -plan for paracentesis 5/34/21  Hyponatremia--hypervolemic -The patient has chronic hyponatremia -Continue diuretics -give additional lasix IV x 1 as abd more distended and legs edematous -Repeat BMP  Sinus tachycardia -Likely secondary to his acute medical illness -Monitor clinically  Hematochezia -suspect anorectal source -GI  consulted-can plan for outpt endoscopy      Status is: Inpatient  Remains inpatient appropriate because:IV treatments appropriate due to intensity of illness or inability to take PO   Dispo: The patient is from: Home  Anticipated d/c is to: Home  Anticipated d/c date is: 5/24 if stable  Patient currently is not medically stable to d/c.         Family Communication:   Spouse updated at bedside 5/22  Consultants:  GI  Code Status:  FULL   DVT Prophylaxis:  SCDs   Procedures: As Listed in Progress Note Above  Antibiotics: Ceftriaxone 5/21>>>      Subjective: Pt states abd is more distended.  Still has significant leg edema.  Denies f/c, cp, n/v/d, abd pain  Objective: Vitals:   07/26/19 2038 07/27/19 0429 07/27/19 0500 07/27/19 1502  BP: 109/68 103/66  103/65  Pulse: (!) 110 (!) 110  (!) 108  Resp: 16 16  18   Temp: 99 F (37.2 C) 98 F (36.7 C)  98.4 F (36.9 C)  TempSrc: Oral Oral  Oral  SpO2: 100% 99%  98%  Weight:   86.6 kg     Intake/Output Summary (Last 24 hours) at 07/27/2019 1709 Last data filed at 07/27/2019 1503 Gross per 24 hour  Intake 480 ml  Output 201 ml  Net 279 ml   Weight change:  Exam:   General:  Pt is alert, follows commands appropriately, not in acute distress  HEENT: No icterus, No thrush, No neck mass, Stella/AT  Cardiovascular: RRR, S1/S2, no rubs, no gallops  Respiratory: bibasilar rales. No wheeze  Abdomen: Soft/+BS, non tender, moderate distended, no guarding  Extremities: 2+LE edema, No lymphangitis, No petechiae, No rashes, no synovitis   Data Reviewed: I have personally reviewed following labs and imaging studies  Basic Metabolic Panel: Recent Labs  Lab 07/25/19 1608 07/25/19 2114 07/27/19 0550 07/27/19 1400  NA 121* 121* 125* 124*  K 4.3 4.0 3.8 4.0  CL 90* 89* 92* 92*  CO2 21* 23 24 24   GLUCOSE 128* 135* 99 132*  BUN 24* 22* 18 17    CREATININE 1.07 1.10 1.03 1.17  CALCIUM 7.9* 8.0* 8.4* 8.5*  MG  --   --  1.9  --    Liver Function Tests: Recent Labs  Lab 07/25/19 1608 07/27/19 0550  AST 48*  --   ALT 23  --   ALKPHOS 83  --   BILITOT 6.6* 4.9*  PROT 6.7  --   ALBUMIN 2.0*  --    No results for input(s): LIPASE, AMYLASE in the last 168 hours. No results for input(s): AMMONIA in the last 168 hours. Coagulation Profile: Recent Labs  Lab 07/27/19 0550  INR 1.7*   CBC: Recent Labs  Lab 07/25/19 1608 07/27/19 0550  WBC 7.7 7.4  NEUTROABS 4.8  --   HGB 8.9* 7.9*  HCT 25.8* 23.4*  MCV 107.9* 106.8*  PLT 232 234   Cardiac Enzymes: No results for input(s): CKTOTAL, CKMB, CKMBINDEX, TROPONINI in the last 168 hours. BNP: Invalid input(s): POCBNP CBG: No results for input(s): GLUCAP in the last 168 hours. HbA1C: No results for input(s): HGBA1C in the last 72 hours. Urine analysis:    Component Value Date/Time   COLORURINE YELLOW 07/25/2019 1700   APPEARANCEUR CLEAR 07/25/2019 1700   LABSPEC 1.012 07/25/2019 1700   PHURINE 5.0 07/25/2019 1700   GLUCOSEU NEGATIVE 07/25/2019 1700   HGBUR MODERATE (A) 07/25/2019 1700   BILIRUBINUR NEGATIVE 07/25/2019 1700   KETONESUR NEGATIVE 07/25/2019 1700   PROTEINUR NEGATIVE 07/25/2019 1700   NITRITE NEGATIVE 07/25/2019 1700   LEUKOCYTESUR NEGATIVE 07/25/2019 1700   Sepsis Labs: @LABRCNTIP (procalcitonin:4,lacticidven:4) ) Recent Results (from the past 240 hour(s))  Culture, body fluid-bottle     Status: Abnormal   Collection Time: 07/22/19  1:34 PM   Specimen: Ascitic  Result Value Ref Range Status   Specimen Description   Final    ASCITIC Performed at [facility], [address] [place], [place] [address]    Special Requests   Final    BOTTLES DRAWN AEROBIC AND ANAEROBIC 10CC Performed at [facility], [address] [place], [place] [address]    Gram Stain   Final    ANAEROBIC BOTTLE ONLY GRAM NEGATIVE RODS Gram Stain Report Called to,Read  Back By and Verified With: LATHRUM,L @ [id number] @[phone] BY MATTHEWS, B 5.19.21    Culture ESCHERICHIA COLI (A)  Final   Report Status 07/25/2019 FINAL  Final   Organism ID, Bacteria ESCHERICHIA COLI  Final      Susceptibility   Escherichia coli - MIC*    AMPICILLIN <=2 SENSITIVE Sensitive     CEFAZOLIN <=4 SENSITIVE Sensitive     CEFEPIME <=1 SENSITIVE Sensitive     CEFTAZIDIME <=1 SENSITIVE Sensitive     CEFTRIAXONE <=1 SENSITIVE Sensitive     CIPROFLOXACIN <=0.25 SENSITIVE Sensitive     GENTAMICIN <=1 SENSITIVE Sensitive     IMIPENEM <=0.25 SENSITIVE Sensitive     TRIMETH/SULFA <=20 SENSITIVE Sensitive     AMPICILLIN/SULBACTAM <=2 SENSITIVE Sensitive     PIP/TAZO <=4 SENSITIVE Sensitive     * ESCHERICHIA COLI  Gram stain     Status: None   Collection Time: 07/22/19  1:34 PM   Specimen: Ascitic  Result Value Ref Range Status  Specimen Description ASCITIC  Final   Special Requests NONE  Final   Gram Stain   Final    NO ORGANISMS SEEN WBC PRESENT, PREDOMINANTLY MONONUCLEAR CYTOSPIN SMEAR [place] Performed at [facility], [address], [place], [place] [address]    Report Status 07/22/2019 FINAL  Final  Culture, body fluid-bottle     Status: None (Preliminary result)   Collection Time: 07/24/19  1:51 PM   Specimen: Ascitic  Result Value Ref Range Status   Specimen Description ASCITIC  Final   Special Requests BOTTLES DRAWN AEROBIC AND ANAEROBIC 10CC  Final   Culture   Final    NO GROWTH 3 DAYS Performed at [facility], [address], [place], [place] [address]    Report Status PENDING  Incomplete  Gram stain     Status: None   Collection Time: 07/24/19  1:56 PM   Specimen: Ascitic  Result Value Ref Range Status   Specimen Description ASCITIC  Final   Special Requests NONE  Final   Gram Stain   Final    NO ORGANISMS SEEN CYTOSPIN SMEAR WBC PRESENT, PREDOMINANTLY MONONUCLEAR [facility] Performed at [facility], [address] [place], [place] [address]    Report Status 07/24/2019 FINAL  Final  Blood culture (routine x 2)     Status: None (Preliminary result)   Collection Time: 07/25/19  3:31 PM   Specimen: BLOOD  Result Value Ref Range Status   Specimen Description BLOOD  Final   Special Requests NONE  Final   Culture   Final    NO GROWTH 2 DAYS Performed at [facility], [address], [place], [place] [address]    Report Status PENDING  Incomplete  Blood culture (routine x 2)     Status: None (Preliminary result)   Collection Time: 07/25/19  4:07 PM   Specimen: BLOOD RIGHT HAND  Result Value Ref Range Status   Specimen Description   Final    BLOOD RIGHT HAND BOTTLES DRAWN AEROBIC AND ANAEROBIC   Special Requests Blood Culture adequate volume  Final   Culture   Final    NO GROWTH 2 DAYS Performed at [facility], [address] [place], [place] [address]    Report Status PENDING  Incomplete  SARS Coronavirus 2 by RT PCR (hospital order, performed in [place] hospital lab) Nasopharyngeal Nasopharyngeal Swab     Status: None   Collection Time: 07/25/19  8:44 PM   Specimen: Nasopharyngeal Swab  Result Value Ref Range Status   SARS Coronavirus 2 NEGATIVE NEGATIVE Final    Comment: (NOTE) SARS-CoV-2 target nucleic acids are NOT DETECTED. The SARS-CoV-2 RNA is generally detectable in upper and lower respiratory specimens during the acute phase of infection. The lowest concentration of SARS-CoV-2 viral copies this assay can detect is 250 copies / mL. A negative result does not preclude SARS-CoV-2 infection and should not be used as the sole basis for treatment or other patient management decisions.  A negative result may occur with improper specimen collection / handling, submission of specimen other than nasopharyngeal swab, presence of viral mutation(s) within the areas targeted by this assay, and inadequate number of viral copies (<250 copies / mL). A negative result must be combined with  clinical observations, patient history, and epidemiological information. Fact Sheet for Patients:   StrictlyIdeas.no Fact Sheet for Healthcare Providers: BankingDealers.co.za This test is not yet approved or cleared  by the [place] FDA and has been authorized for detection and/or  diagnosis of SARS-CoV-2 by FDA under an Emergency Use Authorization (EUA).  This EUA will remain in effect (meaning this test can be used) for the duration of the COVID-19 declaration under Section 564(b)(1) of the Act, 21 U.S.C. section 360bbb-3(b)(1), unless the authorization is terminated or revoked sooner. Performed at [facility], [address] [place], [place] [address]      Scheduled Meds: . baclofen  10 mg Oral TID  . furosemide  20 mg Intravenous Once  . furosemide  40 mg Oral Daily  . lactulose  10 g Oral BID  . pantoprazole  40 mg Oral BID  . spironolactone  100 mg Oral BID   Continuous Infusions: . albumin human 50 g (07/27/19 1047)  . cefTRIAXone (ROCEPHIN)  IV 2 g (07/27/19 1501)  . magnesium sulfate bolus IVPB      Procedures/Studies: US Paracentesis  Result Date: 07/24/2019 INDICATION: Cirrhosis, positive culture at prior paracentesis, reassessment EXAM: ULTRASOUND GUIDED DIAGNOSTIC PARACENTESIS MEDICATIONS: None COMPLICATIONS: None immediate PROCEDURE: Informed written consent was obtained from the patient after a discussion of the risks, benefits and alternatives to treatment. A timeout was performed prior to the initiation of the procedure. Initial ultrasound scanning demonstrates a large amount of ascites within the right lower abdominal quadrant. The right lower abdomen was prepped and draped in the usual sterile fashion. 1% lidocaine was used for local anesthesia. Following this, a 5 [place] Yueh catheter was introduced. An ultrasound image was saved for documentation purposes. The paracentesis was performed. The catheter was removed  and a dressing was applied. The patient tolerated the procedure well without immediate post procedural complication. FINDINGS: A total of approximately 180 mL of yellow ascitic fluid was removed. Samples were sent to the laboratory as requested by the clinical team. IMPRESSION: Successful ultrasound-guided paracentesis yielding 180 mL of peritoneal fluid. Electronically Signed   By: Ulyses Southward M.D.   On: 07/24/2019 15:23   US Paracentesis  Result Date: 07/22/2019 INDICATION: Cirrhosis, ascites EXAM: ULTRASOUND GUIDED DIAGNOSTIC AND THERAPEUTIC PARACENTESIS MEDICATIONS: None COMPLICATIONS: None immediate PROCEDURE: Informed written consent was obtained from the patient after a discussion of the risks, benefits and alternatives to treatment. A timeout was performed prior to the initiation of the procedure. Initial ultrasound scanning demonstrates a large amount of ascites within the right lower abdominal quadrant. The right lower abdomen was prepped and draped in the usual sterile fashion. 1% lidocaine was used for local anesthesia. Following this, a 5 [place] Yueh catheter was introduced. An ultrasound image was saved for documentation purposes. The paracentesis was performed. The catheter was removed and a dressing was applied. The patient tolerated the procedure well without immediate post procedural complication. Patient received post-procedure intravenous albumin; see nursing notes for details. FINDINGS: A total of approximately 4.2 L of yellow ascitic fluid was removed. Volume removed was limited to 4 L at patient request, due to not having felt well when larger volumes have been removed previously. Samples were sent to the laboratory as requested by the clinical team. IMPRESSION: Successful ultrasound-guided paracentesis yielding 4.2 liters of peritoneal fluid. Electronically Signed   By: Ulyses Southward M.D.   On: 07/22/2019 14:20   US Paracentesis  Result Date: 07/16/2019 INDICATION: Ascites. EXAM:  ULTRASOUND GUIDED therapeutic PARACENTESIS MEDICATIONS: None. COMPLICATIONS: None immediate. PROCEDURE: Informed written consent was obtained from the patient after a discussion of the risks, benefits and alternatives to treatment. A timeout was performed prior to the initiation of the procedure. Initial ultrasound scanning demonstrates a large amount of  ascites within the right lower abdominal quadrant. The right lower abdomen was prepped and draped in the usual sterile fashion. 1% lidocaine was used for local anesthesia. Following this, a paracentesis catheter was introduced. An ultrasound image was saved for documentation purposes. The paracentesis was performed. The catheter was removed and a dressing was applied. The patient tolerated the procedure well without immediate post procedural complication. FINDINGS: A total of approximately 4.2 L of serous fluid was removed. IMPRESSION: Successful ultrasound-guided paracentesis yielding 4.2 liters of peritoneal fluid. Electronically Signed   By: Lupita Raider M.D.   On: 07/16/2019 14:00   US Paracentesis  Result Date: 07/08/2019 INDICATION: Cirrhosis, ascites EXAM: ULTRASOUND GUIDED DIAGNOSTIC AND THERAPEUTIC PARACENTESIS MEDICATIONS: None COMPLICATIONS: None immediate PROCEDURE: Informed written consent was obtained from the patient after a discussion of the risks, benefits and alternatives to treatment. A timeout was performed prior to the initiation of the procedure. Initial ultrasound scanning demonstrates a large amount of ascites within the right lower abdominal quadrant. The right lower abdomen was prepped and draped in the usual sterile fashion. 1% lidocaine was used for local anesthesia. Following this, a 19 gauge, 7-cm, Yueh catheter was introduced. An ultrasound image was saved for documentation purposes. The paracentesis was performed. The catheter was removed and a dressing was applied. The patient tolerated the procedure well without immediate post  procedural complication. FINDINGS: A total of approximately 4.2 L of yellow ascitic fluid was removed. Volume was limited to 4.2 L at patient request, since he felt unwell following removal of 8 L at prior paracentesis. Samples were sent to the laboratory as requested by the clinical team. IMPRESSION: Successful ultrasound-guided paracentesis yielding 4.2 liters of peritoneal fluid. Electronically Signed   By: Ulyses Southward M.D.   On: 07/08/2019 15:37   DG Chest Port 1 View  Result Date: 07/25/2019 CLINICAL DATA:  Shortness of breath today, history cirrhosis, positive cultures following paracentesis on 07/22/2019 EXAM: PORTABLE CHEST 1 VIEW COMPARISON:  Portable exam 1541 hours compared to 06/18/2019 FINDINGS: Normal heart size, mediastinal contours, and pulmonary vascularity. Persistent LEFT pleural effusion and basilar atelectasis. Remaining lungs clear. No infiltrate or pneumothorax. IMPRESSION: Persistent small LEFT pleural effusion and LEFT basilar atelectasis. Electronically Signed   By: Ulyses Southward M.D.   On: 07/25/2019 15:50    Catarina Hartshorn, DO  [facility]  If 7PM-7AM, please contact night-coverage [email or web address] Password [email or web address] 07/27/2019, 5:09 PM   LOS: 2 days

## 2019-07-27 NOTE — Discharge Summary (Signed)
Physician Discharge Summary  Alan Phillips MWU:[id number] DOB: [birth year] DOA: 07/25/2019  PCP: Jacquelin Hawking, PA-C  Admit date: 07/25/2019 Discharge date: 07/28/19  Admitted From: Home Disposition:  Home  Recommendations for Outpatient Follow-up:  1. Follow up with PCP in 1-2 weeks 2. Please obtain BMP/CBC in one week     Discharge Condition: Stable CODE STATUS: FULL Diet recommendation: Low sodium   Brief/Interim Summary: 54 year old male with a history of alcoholic liver cirrhosis, chronic hyponatremia, and depression presenting with intermittent abdominal discomfort. The patient was sent from his gastroenterology office secondary to positive cultures from his ascites. The patient has been routinely undergoing paracentesis for his recurrent ascites. Most recently, the patient had a paracentesis on 07/22/2019 which grew E. coli from culture. WBC was 76 with 98% mononuclear cells. The patient himself had been feeling fine without any fevers, worsening abdominal pain, nausea, vomiting. Repeat paracentesis was performed on 07/24/2019. Cultures are currently negative. Ascites showed 83 WBC at that time on 07/24/2019. Because of concerns for SBP, the patient was admitted to the hospital for intravenous antibiotics. The patient has been having some intermittent hematochezia mostly on the toilet paper. He denies any melena. He denies any vomiting, hematemesis, headache, chest pain, shortness breath. In the emergency department, the patient was afebrile hemodynamically stable with oxygen saturation 99% on room air. Hemoglobin was 8.9 with WBC 7.7 and platelets 232,000. The patient was started on ceftriaxone.  Discharge Diagnoses:  Spontaneous bacterial peritonitis -Continue ceftriaxone -plan to d/c home with amoxil x 7 more days -Follow blood cultures--neg to date -ascites culture = E.coli -d/c home with amox/clav bid, x 4 days, then once daily indefinitely for SBP  prophylaxis -not using cipro for SBP prophylaxis due to pt's prolonged QTc which may be exacerbated by long term cipro  Alcoholic liver cirrhosis -Continue furosemide and spironolactone -Continue lactulose -paracentesis 5/34/21--5L removed  Hyponatremia--hypervolemic -The patient has chronic hyponatremia -Continue diuretics -give additional lasix IV x 1 as abd more distended and legs edematous -Repeat BMP  Sinus tachycardia -Likely secondary to his acute medical illness -Monitor clinically  Hematochezia -suspect anorectal source -GI consulted-can plan for outpt endoscopy     Discharge Instructions   Allergies as of 07/28/2019      Reactions   Codeine Hives      Medication List    TAKE these medications   amoxicillin-clavulanate 875-125 MG tablet Commonly known as: AUGMENTIN Take 1 tablet by mouth every 12 (twelve) hours. X 4 days, then one tablet daily thereafter   baclofen 10 MG tablet Commonly known as: LIORESAL Take 1 tablet (10 mg total) by mouth 3 (three) times daily.   furosemide 40 MG tablet Commonly known as: LASIX Take 40 mg by mouth daily.   Lactulose 20 GM/30ML Soln Take 30 mLs (20 g total) by mouth in the morning and at bedtime.   pantoprazole 40 MG tablet Commonly known as: Protonix Take 1 tablet (40 mg total) by mouth 2 (two) times daily.   spironolactone 100 MG tablet Commonly known as: Aldactone Take 1 tablet (100 mg total) by mouth daily.       Allergies  Allergen Reactions  . Codeine Hives    Consultations:  GI   Procedures/Studies: US Paracentesis  Result Date: 07/28/2019 INDICATION: Alcoholic cirrhosis, ascites EXAM: ULTRASOUND GUIDED DIAGNOSTIC AND THERAPEUTIC PARACENTESIS MEDICATIONS: None COMPLICATIONS: None immediate PROCEDURE: Informed written consent was obtained from the patient after a discussion of the risks, benefits and alternatives to treatment. A timeout was performed prior to the initiation  of the  procedure. Initial ultrasound scanning demonstrates a large amount of ascites within the right lower abdominal quadrant. The right lower abdomen was prepped and draped in the usual sterile fashion. 1% lidocaine was used for local anesthesia. Following this, a 5 [place] Yueh catheter was introduced. An ultrasound image was saved for documentation purposes. The paracentesis was performed. The catheter was removed and a dressing was applied. The patient tolerated the procedure well without immediate post procedural complication. Patient received post-procedure intravenous albumin; see nursing notes for details. FINDINGS: A total of approximately 5.18 L of yellow ascitic fluid was removed. Samples were sent to the laboratory as requested by the clinical team. IMPRESSION: Successful ultrasound-guided paracentesis yielding 5.18 liters of peritoneal fluid. Electronically Signed   By: Lavonia Dana M.D.   On: 07/28/2019 10:56   US Paracentesis  Result Date: 07/24/2019 INDICATION: Cirrhosis, positive culture at prior paracentesis, reassessment EXAM: ULTRASOUND GUIDED DIAGNOSTIC PARACENTESIS MEDICATIONS: None COMPLICATIONS: None immediate PROCEDURE: Informed written consent was obtained from the patient after a discussion of the risks, benefits and alternatives to treatment. A timeout was performed prior to the initiation of the procedure. Initial ultrasound scanning demonstrates a large amount of ascites within the right lower abdominal quadrant. The right lower abdomen was prepped and draped in the usual sterile fashion. 1% lidocaine was used for local anesthesia. Following this, a 5 [place] Yueh catheter was introduced. An ultrasound image was saved for documentation purposes. The paracentesis was performed. The catheter was removed and a dressing was applied. The patient tolerated the procedure well without immediate post procedural complication. FINDINGS: A total of approximately 180 mL of yellow ascitic fluid was  removed. Samples were sent to the laboratory as requested by the clinical team. IMPRESSION: Successful ultrasound-guided paracentesis yielding 180 mL of peritoneal fluid. Electronically Signed   By: Lavonia Dana M.D.   On: 07/24/2019 15:23   US Paracentesis  Result Date: 07/22/2019 INDICATION: Cirrhosis, ascites EXAM: ULTRASOUND GUIDED DIAGNOSTIC AND THERAPEUTIC PARACENTESIS MEDICATIONS: None COMPLICATIONS: None immediate PROCEDURE: Informed written consent was obtained from the patient after a discussion of the risks, benefits and alternatives to treatment. A timeout was performed prior to the initiation of the procedure. Initial ultrasound scanning demonstrates a large amount of ascites within the right lower abdominal quadrant. The right lower abdomen was prepped and draped in the usual sterile fashion. 1% lidocaine was used for local anesthesia. Following this, a 5 [place] Yueh catheter was introduced. An ultrasound image was saved for documentation purposes. The paracentesis was performed. The catheter was removed and a dressing was applied. The patient tolerated the procedure well without immediate post procedural complication. Patient received post-procedure intravenous albumin; see nursing notes for details. FINDINGS: A total of approximately 4.2 L of yellow ascitic fluid was removed. Volume removed was limited to 4 L at patient request, due to not having felt well when larger volumes have been removed previously. Samples were sent to the laboratory as requested by the clinical team. IMPRESSION: Successful ultrasound-guided paracentesis yielding 4.2 liters of peritoneal fluid. Electronically Signed   By: Lavonia Dana M.D.   On: 07/22/2019 14:20   US Paracentesis  Result Date: 07/16/2019 INDICATION: Ascites. EXAM: ULTRASOUND GUIDED therapeutic PARACENTESIS MEDICATIONS: None. COMPLICATIONS: None immediate. PROCEDURE: Informed written consent was obtained from the patient after a discussion of the risks,  benefits and alternatives to treatment. A timeout was performed prior to the initiation of the procedure. Initial ultrasound scanning demonstrates a large amount of ascites within the right lower abdominal  quadrant. The right lower abdomen was prepped and draped in the usual sterile fashion. 1% lidocaine was used for local anesthesia. Following this, a paracentesis catheter was introduced. An ultrasound image was saved for documentation purposes. The paracentesis was performed. The catheter was removed and a dressing was applied. The patient tolerated the procedure well without immediate post procedural complication. FINDINGS: A total of approximately 4.2 L of serous fluid was removed. IMPRESSION: Successful ultrasound-guided paracentesis yielding 4.2 liters of peritoneal fluid. Electronically Signed   By: Lupita Raider M.D.   On: 07/16/2019 14:00   US Paracentesis  Result Date: 07/08/2019 INDICATION: Cirrhosis, ascites EXAM: ULTRASOUND GUIDED DIAGNOSTIC AND THERAPEUTIC PARACENTESIS MEDICATIONS: None COMPLICATIONS: None immediate PROCEDURE: Informed written consent was obtained from the patient after a discussion of the risks, benefits and alternatives to treatment. A timeout was performed prior to the initiation of the procedure. Initial ultrasound scanning demonstrates a large amount of ascites within the right lower abdominal quadrant. The right lower abdomen was prepped and draped in the usual sterile fashion. 1% lidocaine was used for local anesthesia. Following this, a 19 gauge, 7-cm, Yueh catheter was introduced. An ultrasound image was saved for documentation purposes. The paracentesis was performed. The catheter was removed and a dressing was applied. The patient tolerated the procedure well without immediate post procedural complication. FINDINGS: A total of approximately 4.2 L of yellow ascitic fluid was removed. Volume was limited to 4.2 L at patient request, since he felt unwell following removal  of 8 L at prior paracentesis. Samples were sent to the laboratory as requested by the clinical team. IMPRESSION: Successful ultrasound-guided paracentesis yielding 4.2 liters of peritoneal fluid. Electronically Signed   By: Ulyses Southward M.D.   On: 07/08/2019 15:37   DG Chest Port 1 View  Result Date: 07/25/2019 CLINICAL DATA:  Shortness of breath today, history cirrhosis, positive cultures following paracentesis on 07/22/2019 EXAM: PORTABLE CHEST 1 VIEW COMPARISON:  Portable exam 1541 hours compared to 06/18/2019 FINDINGS: Normal heart size, mediastinal contours, and pulmonary vascularity. Persistent LEFT pleural effusion and basilar atelectasis. Remaining lungs clear. No infiltrate or pneumothorax. IMPRESSION: Persistent small LEFT pleural effusion and LEFT basilar atelectasis. Electronically Signed   By: Ulyses Southward M.D.   On: 07/25/2019 15:50        Discharge Exam: Vitals:   07/28/19 0859 07/28/19 0950  BP: 118/76 111/72  Pulse: 100 100  Resp: 18 18  Temp:    SpO2: 100% 100%   Vitals:   07/28/19 0000 07/28/19 0520 07/28/19 0859 07/28/19 0950  BP:   118/76 111/72  Pulse:   100 100  Resp:   18 18  Temp:      TempSrc:      SpO2:   100% 100%  Weight:  85 kg    Height:  (1.727 m)  (1.727 m)      General: Pt is alert, awake, not in acute distress Cardiovascular: RRR, S1/S2 +, no rubs, no gallops Respiratory: fine bibasilar rales. No wheeze Abdominal: Soft, NT, mildly distended, bowel sounds + Extremities: 1+ LE edema, no cyanosis   The results of significant diagnostics from this hospitalization (including imaging, microbiology, ancillary and laboratory) are listed below for reference.    Significant Diagnostic Studies: US Paracentesis  Result Date: 07/28/2019 INDICATION: Alcoholic cirrhosis, ascites EXAM: ULTRASOUND GUIDED DIAGNOSTIC AND THERAPEUTIC PARACENTESIS MEDICATIONS: None COMPLICATIONS: None immediate PROCEDURE: Informed written consent was obtained from the  patient after a discussion of the risks, benefits and alternatives to treatment.  A timeout was performed prior to the initiation of the procedure. Initial ultrasound scanning demonstrates a large amount of ascites within the right lower abdominal quadrant. The right lower abdomen was prepped and draped in the usual sterile fashion. 1% lidocaine was used for local anesthesia. Following this, a 5 [place] Yueh catheter was introduced. An ultrasound image was saved for documentation purposes. The paracentesis was performed. The catheter was removed and a dressing was applied. The patient tolerated the procedure well without immediate post procedural complication. Patient received post-procedure intravenous albumin; see nursing notes for details. FINDINGS: A total of approximately 5.18 L of yellow ascitic fluid was removed. Samples were sent to the laboratory as requested by the clinical team. IMPRESSION: Successful ultrasound-guided paracentesis yielding 5.18 liters of peritoneal fluid. Electronically Signed   By: Ulyses SouthwardMark  Boles M.D.   On: 07/28/2019 10:56   US Paracentesis  Result Date: 07/24/2019 INDICATION: Cirrhosis, positive culture at prior paracentesis, reassessment EXAM: ULTRASOUND GUIDED DIAGNOSTIC PARACENTESIS MEDICATIONS: None COMPLICATIONS: None immediate PROCEDURE: Informed written consent was obtained from the patient after a discussion of the risks, benefits and alternatives to treatment. A timeout was performed prior to the initiation of the procedure. Initial ultrasound scanning demonstrates a large amount of ascites within the right lower abdominal quadrant. The right lower abdomen was prepped and draped in the usual sterile fashion. 1% lidocaine was used for local anesthesia. Following this, a 5 [place] Yueh catheter was introduced. An ultrasound image was saved for documentation purposes. The paracentesis was performed. The catheter was removed and a dressing was applied. The patient tolerated the  procedure well without immediate post procedural complication. FINDINGS: A total of approximately 180 mL of yellow ascitic fluid was removed. Samples were sent to the laboratory as requested by the clinical team. IMPRESSION: Successful ultrasound-guided paracentesis yielding 180 mL of peritoneal fluid. Electronically Signed   By: Ulyses SouthwardMark  Boles M.D.   On: 07/24/2019 15:23   US Paracentesis  Result Date: 07/22/2019 INDICATION: Cirrhosis, ascites EXAM: ULTRASOUND GUIDED DIAGNOSTIC AND THERAPEUTIC PARACENTESIS MEDICATIONS: None COMPLICATIONS: None immediate PROCEDURE: Informed written consent was obtained from the patient after a discussion of the risks, benefits and alternatives to treatment. A timeout was performed prior to the initiation of the procedure. Initial ultrasound scanning demonstrates a large amount of ascites within the right lower abdominal quadrant. The right lower abdomen was prepped and draped in the usual sterile fashion. 1% lidocaine was used for local anesthesia. Following this, a 5 [place] Yueh catheter was introduced. An ultrasound image was saved for documentation purposes. The paracentesis was performed. The catheter was removed and a dressing was applied. The patient tolerated the procedure well without immediate post procedural complication. Patient received post-procedure intravenous albumin; see nursing notes for details. FINDINGS: A total of approximately 4.2 L of yellow ascitic fluid was removed. Volume removed was limited to 4 L at patient request, due to not having felt well when larger volumes have been removed previously. Samples were sent to the laboratory as requested by the clinical team. IMPRESSION: Successful ultrasound-guided paracentesis yielding 4.2 liters of peritoneal fluid. Electronically Signed   By: Ulyses SouthwardMark  Boles M.D.   On: 07/22/2019 14:20   US Paracentesis  Result Date: 07/16/2019 INDICATION: Ascites. EXAM: ULTRASOUND GUIDED therapeutic PARACENTESIS MEDICATIONS:  None. COMPLICATIONS: None immediate. PROCEDURE: Informed written consent was obtained from the patient after a discussion of the risks, benefits and alternatives to treatment. A timeout was performed prior to the initiation of the procedure. Initial ultrasound scanning demonstrates a large amount  of ascites within the right lower abdominal quadrant. The right lower abdomen was prepped and draped in the usual sterile fashion. 1% lidocaine was used for local anesthesia. Following this, a paracentesis catheter was introduced. An ultrasound image was saved for documentation purposes. The paracentesis was performed. The catheter was removed and a dressing was applied. The patient tolerated the procedure well without immediate post procedural complication. FINDINGS: A total of approximately 4.2 L of serous fluid was removed. IMPRESSION: Successful ultrasound-guided paracentesis yielding 4.2 liters of peritoneal fluid. Electronically Signed   By: Lupita Raider M.D.   On: 07/16/2019 14:00   US Paracentesis  Result Date: 07/08/2019 INDICATION: Cirrhosis, ascites EXAM: ULTRASOUND GUIDED DIAGNOSTIC AND THERAPEUTIC PARACENTESIS MEDICATIONS: None COMPLICATIONS: None immediate PROCEDURE: Informed written consent was obtained from the patient after a discussion of the risks, benefits and alternatives to treatment. A timeout was performed prior to the initiation of the procedure. Initial ultrasound scanning demonstrates a large amount of ascites within the right lower abdominal quadrant. The right lower abdomen was prepped and draped in the usual sterile fashion. 1% lidocaine was used for local anesthesia. Following this, a 19 gauge, 7-cm, Yueh catheter was introduced. An ultrasound image was saved for documentation purposes. The paracentesis was performed. The catheter was removed and a dressing was applied. The patient tolerated the procedure well without immediate post procedural complication. FINDINGS: A total of  approximately 4.2 L of yellow ascitic fluid was removed. Volume was limited to 4.2 L at patient request, since he felt unwell following removal of 8 L at prior paracentesis. Samples were sent to the laboratory as requested by the clinical team. IMPRESSION: Successful ultrasound-guided paracentesis yielding 4.2 liters of peritoneal fluid. Electronically Signed   By: Ulyses Southward M.D.   On: 07/08/2019 15:37   DG Chest Port 1 View  Result Date: 07/25/2019 CLINICAL DATA:  Shortness of breath today, history cirrhosis, positive cultures following paracentesis on 07/22/2019 EXAM: PORTABLE CHEST 1 VIEW COMPARISON:  Portable exam 1541 hours compared to 06/18/2019 FINDINGS: Normal heart size, mediastinal contours, and pulmonary vascularity. Persistent LEFT pleural effusion and basilar atelectasis. Remaining lungs clear. No infiltrate or pneumothorax. IMPRESSION: Persistent small LEFT pleural effusion and LEFT basilar atelectasis. Electronically Signed   By: Ulyses Southward M.D.   On: 07/25/2019 15:50     Microbiology: Recent Results (from the past 240 hour(s))  Culture, body fluid-bottle     Status: Abnormal   Collection Time: 07/22/19  1:34 PM   Specimen: Ascitic  Result Value Ref Range Status   Specimen Description   Final    ASCITIC Performed at [facility], [address] [place], [place] [address]    Special Requests   Final    BOTTLES DRAWN AEROBIC AND ANAEROBIC 10CC Performed at [facility], [address] [place], [place] [address]    Gram Stain   Final    ANAEROBIC BOTTLE ONLY GRAM NEGATIVE RODS Gram Stain Report Called to,Read Back By and Verified With: LATHRUM,L @ [id number] @[phone] .[phone] BY MATTHEWS, B 5.19.21    Culture ESCHERICHIA COLI (A)  Final   Report Status 07/25/2019 FINAL  Final   Organism ID, Bacteria ESCHERICHIA COLI  Final      Susceptibility   Escherichia coli - MIC*    AMPICILLIN <=2 SENSITIVE Sensitive     CEFAZOLIN <=4 SENSITIVE Sensitive      CEFEPIME <=1 SENSITIVE Sensitive     CEFTAZIDIME <=1 SENSITIVE Sensitive     CEFTRIAXONE <=1 SENSITIVE Sensitive  CIPROFLOXACIN <=0.25 SENSITIVE Sensitive     GENTAMICIN <=1 SENSITIVE Sensitive     IMIPENEM <=0.25 SENSITIVE Sensitive     TRIMETH/SULFA <=20 SENSITIVE Sensitive     AMPICILLIN/SULBACTAM <=2 SENSITIVE Sensitive     PIP/TAZO <=4 SENSITIVE Sensitive     * ESCHERICHIA COLI  Gram stain     Status: None   Collection Time: 07/22/19  1:34 PM   Specimen: Ascitic  Result Value Ref Range Status   Specimen Description ASCITIC  Final   Special Requests NONE  Final   Gram Stain   Final    NO ORGANISMS SEEN WBC PRESENT, PREDOMINANTLY MONONUCLEAR CYTOSPIN SMEAR [place] Performed at [facility], [address], [place], [place] [address]    Report Status 07/22/2019 FINAL  Final  Culture, body fluid-bottle     Status: None (Preliminary result)   Collection Time: 07/24/19  1:51 PM   Specimen: Ascitic  Result Value Ref Range Status   Specimen Description ASCITIC  Final   Special Requests BOTTLES DRAWN AEROBIC AND ANAEROBIC 10CC  Final   Culture   Final    NO GROWTH 4 DAYS Performed at [facility], [address] [place], [place] [address]    Report Status PENDING  Incomplete  Gram stain     Status: None   Collection Time: 07/24/19  1:56 PM   Specimen: Ascitic  Result Value Ref Range Status   Specimen Description ASCITIC  Final   Special Requests NONE  Final   Gram Stain   Final    NO ORGANISMS SEEN CYTOSPIN SMEAR WBC PRESENT, PREDOMINANTLY MONONUCLEAR [facility] Performed at [facility], [address], [place], [place] [address]    Report Status 07/24/2019 FINAL  Final  Blood culture (routine x 2)     Status: None (Preliminary result)   Collection Time: 07/25/19  3:31 PM   Specimen: BLOOD  Result Value Ref Range Status   Specimen Description BLOOD LEFT ANTECUBITAL  Final   Special Requests   Final    BOTTLES DRAWN AEROBIC AND ANAEROBIC Blood Culture  adequate volume   Culture   Final    NO GROWTH 3 DAYS Performed at [facility], [address] [place], [place] [address]    Report Status PENDING  Incomplete  Blood culture (routine x 2)     Status: None (Preliminary result)   Collection Time: 07/25/19  4:07 PM   Specimen: BLOOD RIGHT HAND  Result Value Ref Range Status   Specimen Description   Final    BLOOD RIGHT HAND BOTTLES DRAWN AEROBIC AND ANAEROBIC   Special Requests Blood Culture adequate volume  Final   Culture   Final    NO GROWTH 3 DAYS Performed at [facility], [address], [place], [place] [address]    Report Status PENDING  Incomplete  SARS Coronavirus 2 by RT PCR (hospital order, performed in [facility] hospital lab) Nasopharyngeal Nasopharyngeal Swab     Status: None   Collection Time: 07/25/19  8:44 PM   Specimen: Nasopharyngeal Swab  Result Value Ref Range Status   SARS Coronavirus 2 NEGATIVE NEGATIVE Final    Comment: (NOTE) SARS-CoV-2 target nucleic acids are NOT DETECTED. The SARS-CoV-2 RNA is generally detectable in upper and lower respiratory specimens during the acute phase of infection. The lowest concentration of SARS-CoV-2 viral copies this assay can detect is 250 copies / mL. A negative result does not preclude SARS-CoV-2 infection and should not be used as the sole basis for treatment  or other patient management decisions.  A negative result may occur with improper specimen collection / handling, submission of specimen other than nasopharyngeal swab, presence of viral mutation(s) within the areas targeted by this assay, and inadequate number of viral copies (<250 copies / mL). A negative result must be combined with clinical observations, patient history, and epidemiological information. Fact Sheet for Patients:   BoilerBrush.com.cy Fact Sheet for Healthcare Providers: https://pope.com/ This test is not yet approved or cleared  by the [place] FDA and has been authorized for detection and/or diagnosis of SARS-CoV-2 by FDA under an Emergency Use Authorization (EUA).  This EUA will remain in effect (meaning this test can be used) for the duration of the COVID-19 declaration under Section 564(b)(1) of the Act, 21 U.S.C. section 360bbb-3(b)(1), unless the authorization is terminated or revoked sooner. Performed at [facility], [address] [place], [place] [address]      Labs: Basic Metabolic Panel: Recent Labs  Lab 07/25/19 1608 07/25/19 1608 07/25/19 2114 07/25/19 2114 07/27/19 0550 07/27/19 0550 07/27/19 1400 07/28/19 0553  NA 121*  --  121*  --  125*  --  124* 125*  K 4.3   < > 4.0   < > 3.8   < > 4.0 3.9  CL 90*  --  89*  --  92*  --  92* 92*  CO2 21*  --  23  --  24  --  24 23  GLUCOSE 128*  --  135*  --  99  --  132* 109*  BUN 24*  --  22*  --  18  --  17 16  CREATININE 1.07  --  1.10  --  1.03  --  1.17 1.09  CALCIUM 7.9*  --  8.0*  --  8.4*  --  8.5* 8.3*  MG  --   --   --   --  1.9  --   --  1.9   < > = values in this interval not displayed.   Liver Function Tests: Recent Labs  Lab 07/25/19 1608 07/27/19 0550  AST 48*  --   ALT 23  --   ALKPHOS 83  --   BILITOT 6.6* 4.9*  PROT 6.7  --   ALBUMIN 2.0*  --    No results for input(s): LIPASE, AMYLASE in the last 168 hours. No results for input(s): AMMONIA in the last 168 hours. CBC: Recent Labs  Lab 07/25/19 1608 07/27/19 0550 07/28/19 0553  WBC 7.7 7.4 7.7  NEUTROABS 4.8  --   --   HGB 8.9* 7.9* 7.6*  HCT 25.8* 23.4* 23.0*  MCV 107.9* 106.8* 109.0*  PLT 232 234 216   Cardiac Enzymes: No results for input(s): CKTOTAL, CKMB, CKMBINDEX, TROPONINI in the last 168 hours. BNP: Invalid input(s): POCBNP CBG: No results for input(s): GLUCAP in the last 168 hours.  Time coordinating discharge:  36 minutes  Signed:  Catarina Hartshorn, DO [facility] Pager: [phone] 07/28/2019, 11:13 AM

## 2019-07-28 ENCOUNTER — Inpatient Hospital Stay (HOSPITAL_COMMUNITY): Payer: Self-pay

## 2019-07-28 ENCOUNTER — Encounter (HOSPITAL_COMMUNITY): Payer: Self-pay

## 2019-07-28 ENCOUNTER — Telehealth: Payer: Self-pay | Admitting: Gastroenterology

## 2019-07-28 LAB — CBC
HCT: 23 % — ABNORMAL LOW (ref 39.0–52.0)
Hemoglobin: 7.6 g/dL — ABNORMAL LOW (ref 13.0–17.0)
MCH: 36 pg — ABNORMAL HIGH (ref 26.0–34.0)
MCHC: 33 g/dL (ref 30.0–36.0)
MCV: 109 fL — ABNORMAL HIGH (ref 80.0–100.0)
Platelets: 216 10*3/uL (ref 150–400)
RBC: 2.11 MIL/uL — ABNORMAL LOW (ref 4.22–5.81)
RDW: 15.2 % (ref 11.5–15.5)
WBC: 7.7 10*3/uL (ref 4.0–10.5)
nRBC: 0 % (ref 0.0–0.2)

## 2019-07-28 LAB — BASIC METABOLIC PANEL
Anion gap: 10 (ref 5–15)
BUN: 16 mg/dL (ref 6–20)
CO2: 23 mmol/L (ref 22–32)
Calcium: 8.3 mg/dL — ABNORMAL LOW (ref 8.9–10.3)
Chloride: 92 mmol/L — ABNORMAL LOW (ref 98–111)
Creatinine, Ser: 1.09 mg/dL (ref 0.61–1.24)
GFR calc Af Amer: >60 mL/min (ref >60)
GFR calc non Af Amer: >60 mL/min (ref >60)
Glucose, Bld: 109 mg/dL — ABNORMAL HIGH (ref 70–99)
Potassium: 3.9 mmol/L (ref 3.5–5.1)
Sodium: 125 mmol/L — ABNORMAL LOW (ref 135–145)

## 2019-07-28 LAB — GRAM STAIN

## 2019-07-28 LAB — BODY FLUID CELL COUNT WITH DIFFERENTIAL
Eos, Fluid: 0 %
Lymphs, Fluid: 58 %
Monocyte-Macrophage-Serous Fluid: 38 % — ABNORMAL LOW (ref 50–90)
Neutrophil Count, Fluid: 4 % (ref 0–25)
Total Nucleated Cell Count, Fluid: 55 cu mm (ref 0–1000)

## 2019-07-28 LAB — MAGNESIUM: Magnesium: 1.9 mg/dL (ref 1.7–2.4)

## 2019-07-28 LAB — GLUCOSE, PLEURAL OR PERITONEAL FLUID: Glucose, Fluid: 105 mg/dL

## 2019-07-28 MED ORDER — AMOXICILLIN-POT CLAVULANATE 875-125 MG PO TABS: 1.0000 | ORAL_TABLET | Freq: Two times a day (BID) | ORAL | 1 refills | Status: DC

## 2019-07-28 MED ORDER — ALBUMIN HUMAN 25 % IV SOLN: 50.0000 g | Freq: Once | INTRAVENOUS | Status: DC

## 2019-07-28 MED ORDER — ALBUMIN HUMAN 25 % IV SOLN: 25.0000 g | Freq: Once | INTRAVENOUS | Status: DC

## 2019-07-28 MED ORDER — AMOXICILLIN-POT CLAVULANATE 875-125 MG PO TABS
1.0000 | ORAL_TABLET | Freq: Two times a day (BID) | ORAL | Status: DC
  Administered 2019-07-28: 1 via ORAL
  Filled 2019-07-28: qty 1

## 2019-07-28 NOTE — Telephone Encounter (Signed)
Misty Stanley, we need to have patient come back in within the next 1-2 weeks at the most for a hospital follow-up. Preferably Tana Coast, PA-C, who has been seeing him as outpatient.   [facility] clinical pool: please cancel EGD that is upcoming 6/15, as he has already had one. Could a colonoscopy be done then instead?

## 2019-07-28 NOTE — Progress Notes (Signed)
Paracentesis complete no signs of distress.  

## 2019-07-28 NOTE — Progress Notes (Signed)
Subjective: Abdominal distension improved s/p para this morning. No abdominal pain. Feels less fatigued. Eating breakfast. No overt GI bleeding. Continues with lower extremity edema. States he has an appt at [facility] July 21st. Last alcohol in Jan 2021. No confusion. Significant other at bedside states he sometimes is slower finding words but no overt confusion or mental status changes. Small amount of blood with wiping, states he has had hemorrhoids for years. No prior colonoscopy.   Objective: Vital signs in last 24 hours: Temp:  [98.4 F (36.9 C)-98.5 F (36.9 C)] 98.5 F (36.9 C) (05/23 2138) Pulse Rate:  [107-108] 107 (05/23 2138) Resp:  [16-18] 16 (05/23 2138) BP: (103-105)/(63-65) 105/63 (05/23 2138) SpO2:  [97 %-98 %] 97 % (05/23 2138) Weight:  [85 kg] 85 kg (05/24 0520) Last BM Date: 07/27/19 General:   Alert and oriented, pleasant, chronically ill-appearing, sallow complexion Head:  Normocephalic and atraumatic. Abdomen:  Bowel sounds present, distended but soft, small umbilical hernia, no TTP Extremities:  With  2-3+ pitting edema to thigh Neurologic:  Alert and  oriented x4; Psych:  Normal mood and affect.  Intake/Output from previous day: 05/23 0701 - 05/24 0700 In: 843 [P.O.:720; IV Piggyback:123] Out: 751 [Urine:750; Stool:1] Intake/Output this shift: No intake/output data recorded.  Lab Results: Recent Labs    07/25/19 1608 07/27/19 0550 07/28/19 0553  WBC 7.7 7.4 7.7  HGB 8.9* 7.9* 7.6*  HCT 25.8* 23.4* 23.0*  PLT 232 234 216   BMET Recent Labs    07/27/19 0550 07/27/19 1400 07/28/19 0553  NA 125* 124* 125*  K 3.8 4.0 3.9  CL 92* 92* 92*  CO2 24 24 23   GLUCOSE 99 132* 109*  BUN 18 17 16   CREATININE 1.03 1.17 1.09  CALCIUM 8.4* 8.5* 8.3*   LFT Recent Labs    07/25/19 1608 07/27/19 0550  PROT 6.7  --   ALBUMIN 2.0*  --   AST 48*  --   ALT 23  --   ALKPHOS 83  --   BILITOT 6.6* 4.9*   PT/INR Recent Labs    07/27/19 0550   LABPROT 19.4*  INR 1.7*    Assessment: 54 year old male with history of ETOH cirrhosis, MELD Na 27 this admission, with unremarkable cell count after outpatient para last week but fluid culture positive for E.coli and felt to be early SBP. Admitted Friday for concerns of early SBP, starting on ceftriaxone 5/21.  Albumin 50 g IV daily started over the weekend. Doppler ultrasound pending.  Ascites/anasarca: pitting edema to thigh. Currently on lasix 40 mg, aldactone 100 mg BID, receiving Albumin 50 g IV daily. 190 on admission, now 187.5. Needs daily weights and strict I/0s. Still with notable anasarca.   Concerns for SBP: repeat cell count today with PMNs 2.2, culture pending. Recommend outpatient daily prophylaxis at discharge.    Low-volume hematochezia: chronic. No prior colonoscopy. Reports history of hemorrhoids. Will need as outpatient. EGD already completed April 2021 with Grade 1 esophageal varices, medium-sized hiatal hernia, portal gastropathy, Cameron erosions.   Anemia: Hgb 7.6, down from 8.9 on admission. No overt GI bleeding. Historically in 9-11 range. Recommend colonoscopy as outpatient.   Plan: Complete course of antibiotics, with daily oral SBP prophylaxis thereafter Lasix 40 mg, aldactone 100 mg daily: discussed with Dr. May 2021.  2 gram sodium diet Daily weights Strict I/Os Will arrange colonoscopy as outpatient Keep appt upcoming at [facility] in July 2021 Continue ETOH cessation Close follow-up as outpatient to be arranged  Alan Phillips Needs, PhD, ANP-BC [facility]     LOS: 3 days    07/28/2019, 8:19 AM

## 2019-07-28 NOTE — Plan of Care (Signed)

## 2019-07-28 NOTE — Telephone Encounter (Signed)
Informed endo scheduler to cancel EGD.  Alan Phillips, not enough room on schedule for TCS 08/19/19.

## 2019-07-28 NOTE — Procedures (Signed)
PreOperative Dx: Alcoholic cirrhosis, ascites Postoperative Dx: Alcoholic cirrhosis, ascites Procedure:   US guided paracentesis Radiologist:  Tyron Russell Anesthesia:  10 ml of1% lidocaine Specimen:  5.18 L of yellow ascitic fluid EBL:   < 1 ml Complications: None

## 2019-07-29 ENCOUNTER — Telehealth: Payer: Self-pay | Admitting: Internal Medicine

## 2019-07-29 LAB — CULTURE, BODY FLUID W GRAM STAIN -BOTTLE: Culture: NO GROWTH

## 2019-07-29 LAB — PATHOLOGIST SMEAR REVIEW

## 2019-07-29 MED ORDER — RIFAXIMIN 550 MG PO TABS
550.0000 mg | ORAL_TABLET | Freq: Two times a day (BID) | ORAL | 5 refills | Status: DC
Start: 2019-07-29 — End: 2019-08-07

## 2019-07-29 NOTE — Telephone Encounter (Signed)
I would be worried that he may have encephalopathy from his liver disease. He is currently being treated for SBP and infection can trigger confusion in cirrhotics.   1# He needs to be having at least 4 soft stools daily (should be on lactulose). If he is not having 4 stools daily he can increase lactulose to achieve this. Would add an extra dose daily if needed.  2# Add Xifaxan 550mg  BID for hepatic encephalopathy. RX sent to pharmacy.  3# If he is confused, he should not be home alone. And if confusion does not clear up with increased stools, then he needs to go to the ER.  4# We will need to address long term SBP prophylaxis with R[facility] as I see hospital doctor has him on Augmentin long term. Further recommendations regarding this.

## 2019-07-29 NOTE — Telephone Encounter (Signed)
We have no openings in the next few weeks

## 2019-07-29 NOTE — Telephone Encounter (Signed)
Spoke with pts spouse. Pt called his spouse at work and seems to be confused. Pt was crying and didn't know if he was dreaming. Pt spouse says pt was prescribe an antibiotic yesterday before leaving the hospital. Pt was also given magnesium via IV Saturday, Sunday and Monday. Spouse isn't sure if any of these medicines can cause depression or confusion. Pt reports no pain.

## 2019-07-29 NOTE — Telephone Encounter (Signed)
[phone]  Please call patient wife, she is very concerned.  Patient is crying and very depressed, wife wanting to know is something we prescribed to him can cause depression.

## 2019-07-29 NOTE — Telephone Encounter (Signed)
Spoke with pts spouse. She was notified of LSL recommendations. Xifaxan was sent to pts pharmacy and pts spouse said the cost is $3, 000. 14 days work of [employer] are ready for pickup. I've left a message for the free clinic to contact me back. Pt will need assistance with getting medication and I'm working on this.

## 2019-07-29 NOTE — Addendum Note (Signed)
Addended by: Tiffany Kocher on: 07/29/2019 02:30 PM   Modules accepted: Orders

## 2019-07-30 LAB — CULTURE, BLOOD (ROUTINE X 2)
Culture: NO GROWTH
Culture: NO GROWTH
Special Requests: ADEQUATE
Special Requests: ADEQUATE

## 2019-08-01 NOTE — Telephone Encounter (Signed)
Can we please put him on the cancellation list. There is a 10am on 6/10 with Kristen. Can we plug in there to secure a spot.

## 2019-08-01 NOTE — Telephone Encounter (Signed)
Called pt and spoke with Alan Phillips (listed on DPR).  Pt was not at home and she requested that I talk to her.  She agreed to appt for pt on 08/14/19 with [facility] at 8:00.

## 2019-08-04 LAB — CULTURE, BODY FLUID W GRAM STAIN -BOTTLE: Culture: NO GROWTH

## 2019-08-06 ENCOUNTER — Telehealth: Payer: Self-pay | Admitting: Internal Medicine

## 2019-08-06 MED ORDER — FLUCONAZOLE 100 MG PO TABS
ORAL_TABLET | ORAL | 0 refills | Status: DC
Start: 2019-08-06 — End: 2019-08-14

## 2019-08-06 MED ORDER — SULFAMETHOXAZOLE-TRIMETHOPRIM 800-160 MG PO TABS: 1.0000 | ORAL_TABLET | Freq: Every day | ORAL | 11 refills | Status: DC

## 2019-08-06 NOTE — Telephone Encounter (Signed)
Diflucan 200mg  PO day one, then 100mg  daily for 7 days. RX sent.  STOP amoxicillin, start Bactrim DS one daily forever. Bactrim is more appropriate to prevent infection in your abdominal fluid. RX sent.  Continue lactulose and Xifaxan. WE NEED TO GET PT ASSISTANCE FOR XIFAXAN. Keep ov for 08/14/19 Can we see if we can get him in sooner with transplant clinic, I think he is schedule in July at [facility].

## 2019-08-06 NOTE — Telephone Encounter (Addendum)
Called pt and spoke to wife (listed on DPR).  Advised pt to start Diflucan 200mg  PO day one, then 100mg  daily for 7 days. RX sent.  STOP amoxicillin, start Bactrim DS one daily forever. Bactrim is more appropriate to prevent infection in your abdominal fluid. RX sent.  Informed to pick up prescriptions.    Informed to continue lactulose and Xifaxan. Informed her that WE NEED TO GET PT ASSISTANCE FOR XIFAXAN started for him.  Advised her to keep his ov for 08/14/19.  Will route to clinical pool to see if we can get him in sooner with transplant clinic.  She voiced understanding and took down all information given for pt.

## 2019-08-06 NOTE — Telephone Encounter (Signed)
We need to get patient assistance for Xifaxan.  See other telephone note, I have asked for him to stop augmentin and start Bactrim DS daily for SBP prophylaxis. Discussed with [facility].

## 2019-08-06 NOTE — Telephone Encounter (Signed)
Pt's wife called to say that patient has been taking antibiotics and now has thrush. She wanted to know what we recommended. Please advise. [phone]

## 2019-08-06 NOTE — Telephone Encounter (Signed)
Pt's wife says pt has developed thrush.  White splotches with red bumps inside of mouth.  No taste with food.  Pt went to [facility] and was there for 3 or 4 days and had antibiotics IV.  Taking Amoxicillin 875 mg twice daily for 4 days.  Now taking it daily with 1 refill left.  Pt is taking Lactulose daily as well.  Pt had TAP on 5/24.  Pt's wife said that pt is taking Xifaxan for 14 days.  He has 5 days left.  Has on for 08/14/19.

## 2019-08-07 MED ORDER — RIFAXIMIN 550 MG PO TABS: 550.0000 mg | ORAL_TABLET | Freq: Two times a day (BID) | ORAL | 5 refills | Status: DC

## 2019-08-07 NOTE — Addendum Note (Signed)
Addended by: Myra Rude on: 08/07/2019 08:06 AM   Modules accepted: Orders

## 2019-08-07 NOTE — Telephone Encounter (Signed)
Lmom of [place] to f/u on pt assistance.

## 2019-08-07 NOTE — Telephone Encounter (Signed)
LMOVM for [facility] with [facility]

## 2019-08-07 NOTE — Telephone Encounter (Signed)
Called wife to confirm insurance information and she informed me that pt has insurance effective 08/05/19. The insurance is [employer].  The member ID is [id number].   Informed her to call the pharmacy to see if [employer] is covered.  If not or too expensive, then we can move forward with pt assistance program.  Wife voiced understanding.

## 2019-08-08 ENCOUNTER — Telehealth: Payer: Self-pay | Admitting: *Deleted

## 2019-08-08 NOTE — Telephone Encounter (Signed)
Pt's wife called in and said that pt would like to have a TAP before his appt on 6/10 with [place].  Wants to know if that is ok.

## 2019-08-08 NOTE — Telephone Encounter (Signed)
Wife called back and said without insurance it would be $3980.00 and with insurance it would cost $2971.00.  Will fax pt assistance forms to [employer].

## 2019-08-08 NOTE — Telephone Encounter (Signed)
He should have a standing order for a LVAP. [facility] clinical pool, can you see if they need help scheduling.

## 2019-08-08 NOTE — Telephone Encounter (Signed)
Spoke with Joylene Igo and was advised no sooner appointments available. They are no book in September. Asked about a cancellation list but she states he does not have [facility] and therefore unable to add to cancellation list. Patient can call how ever often they want to check on cancellations.

## 2019-08-11 ENCOUNTER — Other Ambulatory Visit: Payer: Self-pay

## 2019-08-11 DIAGNOSIS — R188 Other ascites: Secondary | ICD-10-CM

## 2019-08-11 NOTE — Telephone Encounter (Signed)
Noted  

## 2019-08-11 NOTE — Telephone Encounter (Signed)
Para orders entered per standing order. Called and informed his wife she can call central scheduling to make appt.

## 2019-08-13 NOTE — Progress Notes (Signed)
Referring Provider: Soyla Dryer, PA-C Primary Care Physician:  Soyla Dryer, PA-C Primary GI Physician: Dr. Gala Romney  Chief Complaint  Patient presents with   Ascites    having PARA done today   Cirrhosis    lactulose making having dry heaves. xifaxin last dose was monday. can't afford it. trying to get assistance through health department    HPI:   Alan Phillips is a 54 y.o. male presenting today for hospital follow-up.  Diagnosed with alcoholic cirrhosis in January/February 2021 at the time of new onset of ascites/anasarca. We first saw patient in consult in March 2021. Reported he stopped drinking alcohol in February.  Found to have negative viral markers with no immunity to Hep A or B.  Mild elevation of ferritin at 541 and percent saturation at 43% suspected to be secondary to alcohol rather than hemochromatosis.  Plan to recheck this in a couple of months.  He has required frequent paracenteses as his ascites is not responding well to diuretic therapy.  Paracentesis 07/22/2019 with low cell count and Gram stain negative but culture grew E. Coli.  After discussing with infectious disease specialist, Dr. Graylon Good, it was recommended for patient to be treated for SBP.   Admitted from 5/21-5/24. He complained of hematochezia while inpatient and reported history of hemorrhoids with chronic intermittent toilet tissue hematochezia.  Denied frank rectal bleeding or melena..  Notably, previously hospitalized in April for upper GI bleed and underwent EGD revealing grade 1 esophageal varices, medium sized hiatal hernia, Cameron erosions, and portal gastropathy with some bleeding which was controlled with application of 2 clips. No colonoscopy was pursued while inpatient as he was not experiencing copious rectal bleeding.  Recommended outpatient colonoscopy for CRC screening.  Meld recalculated and was 27. He received albumin/Lasix runs x3 days and spironolactone dose was doubled to 100 mg twice  daily with mild improvement in anasarca.  Doppler ultrasound with patent portal vein.  Para repeated on 07/28/2019 yielding 5.18 L, cell count low, Gram stain negative, culture negative.  Recommended SBP prophylaxis at discharge.  He was discharged on 5/24 and advised to complete Augmentin twice daily x4 days then once daily for better for SBP prophylaxis (no Cipro due to prolonged QTC), Lasix 40 mg daily, spironolactone 100 mg daily, Protonix twice daily.  Notably, hemoglobin 7.6 on discharge, down from 8.9 on admission.   Telephone call 07/29/19 with wife reporting patient seemed confused. He was advised to titrate lactulose to having at least 4 stools a day. Xifaxan 550mg  BID sent to pharmacy. He needs patient assistance for Xifaxan. He was on Augmentin for SBP prophylaxis; this was changed to Bactrim DS daily.   Telephone call 6/2 with report of thrush. Prescribed Diflucan 200 mg x1 then 100 mg daily x 7 days. Again advised to stop Amoxicillin and start Bactrim DS daily forever.   He has been referred to [facility] transplant. Scheduled in July 21st. No sooner appts available.   He has standing orders for paras. He is scheduled for a para on 6/10 (today).   Today:  Seeing spots. Feels lightheaded/dizzy even while sitting.  Started on the first of the week. Feels nauseated and drained. Mild pressure above his abdomen. Scheduled for paracentesis today. Distension is about the same as it has been all along. Fluid re-accumulates with 24-48 hours after para typically. Checks weights at home, 179.5 lbs yesterday, today 180 lbs. Gains about .5 lbs daily. Everything he eats is baked or boiled. No canned foods. No salt  added. Some sprite. Appetite is improved. Hard to keep up with how much fluid he is drinking but states he tries to limit it. Feels he is urinating a good amount. Had confusion about 2 weeks ago. Last dose of Xifaxan was Monday. Can't afford it. Trying to get assistance through health department.  Dropped off paperwork 2 days ago. Should know something within 7-14 days. Since not taking it, feels a little more foggy headed/slower. Lactulose BID. None last night or this morning because it makes him nauseated. No vomiting. Will have 3-5 BMs a day. No blood in the stool or black stool. No toilet tissue hematochezia since hospitalization. No GERD symptoms or dysphagia.   LE edema about the same. Swelling up to his knees. No yellowing of eyes or skin.   Hasn't received Hep A/B vaccine yet.   No fevers, chills, CP, or palpitations. No SOB or cough.   Past Medical History:  Diagnosis Date   Cirrhosis of liver (HCC)    Depression    Psoriasis     Past Surgical History:  Procedure Laterality Date   BIOPSY  06/20/2019   Procedure: BIOPSY;  Surgeon: West Bali, MD;  Location: [facility];  Service: Endoscopy;;  gastric   ESOPHAGEAL BANDING N/A 06/20/2019   Procedure: ESOPHAGEAL BANDING;  Surgeon: West Bali, MD;  Location: [facility];  Service: Endoscopy;  Laterality: N/A;   ESOPHAGOGASTRODUODENOSCOPY (EGD) WITH PROPOFOL N/A 06/20/2019   Procedure: ESOPHAGOGASTRODUODENOSCOPY (EGD) WITH PROPOFOL;  Surgeon: West Bali, MD;  grade 1 esophageal varices, medium sized hiatal hernia, Cameron erosions, and portal gastropathy with some bleeding which was controlled with application of 2 clips. Bx: Neg H. pylori   none      Current Outpatient Medications  Medication Sig Dispense Refill   baclofen (LIORESAL) 10 MG tablet Take 1 tablet (10 mg total) by mouth 3 (three) times daily. 90 tablet 0   furosemide (LASIX) 40 MG tablet Take 40 mg by mouth daily.     Lactulose 20 GM/30ML SOLN Take 30 mLs (20 g total) by mouth in the morning and at bedtime. 1892 mL 1   pantoprazole (PROTONIX) 40 MG tablet Take 1 tablet (40 mg total) by mouth 2 (two) times daily. 60 tablet 1   spironolactone (ALDACTONE) 100 MG tablet Take 1 tablet (100 mg total) by mouth daily. 30 tablet 5    sulfamethoxazole-trimethoprim (BACTRIM DS) 800-160 MG tablet Take 1 tablet by mouth daily. To prevent infection in your abdominal fluid. 30 tablet 11   rifaximin (XIFAXAN) 550 MG TABS tablet Take 1 tablet (550 mg total) by mouth 2 (two) times daily. (Patient not taking: Reported on 08/14/2019) 60 tablet 5   No current facility-administered medications for this visit.    Allergies as of 08/14/2019 - Review Complete 08/14/2019  Allergen Reaction Noted   Codeine Hives 05/14/2019    Family History  Problem Relation Age of Onset   Cirrhosis Paternal Uncle        etoh   Cancer Paternal Uncle    Alcohol abuse Paternal Uncle    Alzheimer's disease Father    Heart failure Maternal Uncle     Social History   Socioeconomic History   Marital status: Married    Spouse name: Not on file   Number of children: Not on file   Years of education: Not on file   Highest education level: Not on file  Occupational History   Not on file  Tobacco Use   Smoking  status: Former Smoker    Packs/day: 0.50    Years: 42.00    Pack years: 21.00    Types: Cigarettes    Quit date: 12/05/2018    Years since quitting: 0.6   Smokeless tobacco: Former Neurosurgeon    Quit date: 05/14/1986   Tobacco comment: quit last year  Vaping Use   Vaping Use: Never used  Substance and Sexual Activity   Alcohol use: Not Currently    Comment: h/o longtime etoh abuse but quit 04/2019 when he found out he has cirrhosis (05/14/19)   Drug use: Yes    Types: Marijuana    Comment: few times a week    Sexual activity: Not on file  Other Topics Concern   Not on file  Social History Narrative   Not on file   Social Determinants of Health   Financial Resource Strain:    Difficulty of Paying Living Expenses:   Food Insecurity:    Worried About Programme researcher, broadcasting/film/video in the Last Year:    Barista in the Last Year:   Transportation Needs:    Freight forwarder (Medical):    Lack of Transportation  (Non-Medical):   Physical Activity:    Days of Exercise per Week:    Minutes of Exercise per Session:   Stress:    Feeling of Stress :   Social Connections:    Frequency of Communication with Friends and Family:    Frequency of Social Gatherings with Friends and Family:    Attends Religious Services:    Active Member of Clubs or Organizations:    Attends Engineer, structural:    Marital Status:     Review of Systems: Gen: See HPI CV: See HPI Resp: See HPI GI: See HPI Derm: Denies rash Heme: See HPI  Physical Exam: BP 95/61    Pulse 93    Temp (!) 97.3 F (36.3 C) (Oral)    Ht 5\' 9"  (1.753 m)    Wt 183 lb 6.4 oz (83.2 kg)    BMI 27.08 kg/m  General:   Alert and oriented. No distress noted. Pleasant and cooperative.  Head:  Normocephalic and atraumatic. Eyes:  Conjuctiva clear without scleral icterus. Heart:  S1, S2 present without murmurs appreciated. Lungs:  Clear to auscultation bilaterally. No wheezes, rales, or rhonchi. No distress.  Abdomen:  +BS, Distended abdomen, mildly tense, non tender. Easily reducible umbilical hernia. No rebound or guarding. No HSM or masses noted. Msk:  Symmetrical without gross deformities. Normal posture. Extremities:  With 3+ bilateral LE pitting edema up to knees.  Neurologic:  Alert and  oriented x4 Psych:  Normal mood and affect.

## 2019-08-14 ENCOUNTER — Encounter (HOSPITAL_COMMUNITY): Payer: Self-pay | Admitting: *Deleted

## 2019-08-14 ENCOUNTER — Encounter: Payer: Self-pay | Admitting: Gastroenterology

## 2019-08-14 ENCOUNTER — Ambulatory Visit (INDEPENDENT_AMBULATORY_CARE_PROVIDER_SITE_OTHER): Payer: Self-pay | Admitting: Gastroenterology

## 2019-08-14 ENCOUNTER — Other Ambulatory Visit: Payer: Self-pay

## 2019-08-14 ENCOUNTER — Ambulatory Visit (HOSPITAL_COMMUNITY): Admission: RE | Admit: 2019-08-14 | Payer: Self-pay | Source: Ambulatory Visit

## 2019-08-14 ENCOUNTER — Observation Stay (HOSPITAL_COMMUNITY)
Admission: AD | Admit: 2019-08-14 | Discharge: 2019-08-15 | Disposition: A | Discharge status: Home / Self Care | Payer: Self-pay | Attending: Family Medicine | Admitting: Family Medicine

## 2019-08-14 ENCOUNTER — Encounter (HOSPITAL_COMMUNITY): Payer: Self-pay | Admitting: Internal Medicine

## 2019-08-14 VITALS — BP 95/61 | HR 93 | Temp 97.3°F | Ht 69.0 in | Wt 183.4 lb

## 2019-08-14 DIAGNOSIS — F1021 Alcohol dependence, in remission: Secondary | ICD-10-CM | POA: Insufficient documentation

## 2019-08-14 DIAGNOSIS — D539 Nutritional anemia, unspecified: Secondary | ICD-10-CM

## 2019-08-14 DIAGNOSIS — K625 Hemorrhage of anus and rectum: Secondary | ICD-10-CM | POA: Insufficient documentation

## 2019-08-14 DIAGNOSIS — K7031 Alcoholic cirrhosis of liver with ascites: Principal | ICD-10-CM | POA: Diagnosis present

## 2019-08-14 DIAGNOSIS — Z79899 Other long term (current) drug therapy: Secondary | ICD-10-CM | POA: Insufficient documentation

## 2019-08-14 DIAGNOSIS — Z87891 Personal history of nicotine dependence: Secondary | ICD-10-CM | POA: Insufficient documentation

## 2019-08-14 DIAGNOSIS — I959 Hypotension, unspecified: Secondary | ICD-10-CM

## 2019-08-14 DIAGNOSIS — E871 Hypo-osmolality and hyponatremia: Secondary | ICD-10-CM | POA: Diagnosis present

## 2019-08-14 DIAGNOSIS — R601 Generalized edema: Secondary | ICD-10-CM | POA: Diagnosis present

## 2019-08-14 DIAGNOSIS — K7460 Unspecified cirrhosis of liver: Secondary | ICD-10-CM | POA: Diagnosis present

## 2019-08-14 DIAGNOSIS — Z20822 Contact with and (suspected) exposure to covid-19: Secondary | ICD-10-CM | POA: Insufficient documentation

## 2019-08-14 LAB — CBC WITH DIFFERENTIAL/PLATELET
Abs Immature Granulocytes: 0.04 10*3/uL (ref 0.00–0.07)
Basophils Absolute: 0.1 10*3/uL (ref 0.0–0.1)
Basophils Relative: 1 %
Eosinophils Absolute: 0.2 10*3/uL (ref 0.0–0.5)
Eosinophils Relative: 3 %
HCT: 28.5 % — ABNORMAL LOW (ref 39.0–52.0)
Hemoglobin: 9.4 g/dL — ABNORMAL LOW (ref 13.0–17.0)
Immature Granulocytes: 1 %
Lymphocytes Relative: 15 %
Lymphs Abs: 1 10*3/uL (ref 0.7–4.0)
MCH: 36 pg — ABNORMAL HIGH (ref 26.0–34.0)
MCHC: 33 g/dL (ref 30.0–36.0)
MCV: 109.2 fL — ABNORMAL HIGH (ref 80.0–100.0)
Monocytes Absolute: 1 10*3/uL (ref 0.1–1.0)
Monocytes Relative: 15 %
Neutro Abs: 4.5 10*3/uL (ref 1.7–7.7)
Neutrophils Relative %: 65 %
Platelets: 125 10*3/uL — ABNORMAL LOW (ref 150–400)
RBC: 2.61 MIL/uL — ABNORMAL LOW (ref 4.22–5.81)
RDW: 15 % (ref 11.5–15.5)
WBC: 6.8 10*3/uL (ref 4.0–10.5)
nRBC: 0 % (ref 0.0–0.2)

## 2019-08-14 LAB — COMPREHENSIVE METABOLIC PANEL
ALT: 20 U/L (ref 0–44)
AST: 41 U/L (ref 15–41)
Albumin: 2.5 g/dL — ABNORMAL LOW (ref 3.5–5.0)
Alkaline Phosphatase: 99 U/L (ref 38–126)
Anion gap: 7 (ref 5–15)
BUN: 15 mg/dL (ref 6–20)
CO2: 25 mmol/L (ref 22–32)
Calcium: 8.3 mg/dL — ABNORMAL LOW (ref 8.9–10.3)
Chloride: 97 mmol/L — ABNORMAL LOW (ref 98–111)
Creatinine, Ser: 1.15 mg/dL (ref 0.61–1.24)
GFR calc Af Amer: >60 mL/min (ref >60)
GFR calc non Af Amer: >60 mL/min (ref >60)
Glucose, Bld: 115 mg/dL — ABNORMAL HIGH (ref 70–99)
Potassium: 4.5 mmol/L (ref 3.5–5.1)
Sodium: 129 mmol/L — ABNORMAL LOW (ref 135–145)
Total Bilirubin: 3.5 mg/dL — ABNORMAL HIGH (ref 0.3–1.2)
Total Protein: 6.6 g/dL (ref 6.5–8.1)

## 2019-08-14 LAB — AMMONIA: Ammonia: 35 umol/L (ref 9–35)

## 2019-08-14 LAB — PROTIME-INR
INR: 1.7 — ABNORMAL HIGH (ref 0.8–1.2)
Prothrombin Time: 19.4 seconds — ABNORMAL HIGH (ref 11.4–15.2)

## 2019-08-14 MED ORDER — RIFAXIMIN 550 MG PO TABS
550.0000 mg | ORAL_TABLET | Freq: Two times a day (BID) | ORAL | Status: DC
  Administered 2019-08-14 – 2019-08-15 (×2): 550 mg via ORAL
  Filled 2019-08-14 (×2): qty 1

## 2019-08-14 MED ORDER — SODIUM CHLORIDE 0.9% FLUSH
3.0000 mL | Freq: Two times a day (BID) | INTRAVENOUS | Status: DC
  Administered 2019-08-14 – 2019-08-15 (×2): 3 mL via INTRAVENOUS

## 2019-08-14 MED ORDER — ALBUMIN HUMAN 25 % IV SOLN
12.5000 g | Freq: Two times a day (BID) | INTRAVENOUS | Status: AC
  Administered 2019-08-14 – 2019-08-15 (×2): 12.5 g via INTRAVENOUS
  Filled 2019-08-14 (×5): qty 50

## 2019-08-14 MED ORDER — SODIUM CHLORIDE 0.9 % IV SOLN: 250.0000 mL | INTRAVENOUS | Status: DC | PRN

## 2019-08-14 MED ORDER — LACTULOSE 10 GM/15ML PO SOLN
20.0000 g | Freq: Two times a day (BID) | ORAL | Status: DC
  Administered 2019-08-14 – 2019-08-15 (×2): 20 g via ORAL
  Filled 2019-08-14 (×2): qty 30

## 2019-08-14 MED ORDER — ALBUMIN HUMAN 25 % IV SOLN
25.0000 g | Freq: Once | INTRAVENOUS | Status: AC
  Administered 2019-08-14: 25 g via INTRAVENOUS
  Filled 2019-08-14: qty 100

## 2019-08-14 MED ORDER — SPIRONOLACTONE 100 MG PO TABS
100.0000 mg | ORAL_TABLET | Freq: Every day | ORAL | Status: DC
  Administered 2019-08-15: 100 mg via ORAL
  Filled 2019-08-14: qty 1

## 2019-08-14 MED ORDER — PANTOPRAZOLE SODIUM 40 MG PO TBEC
40.0000 mg | DELAYED_RELEASE_TABLET | Freq: Two times a day (BID) | ORAL | Status: DC
  Administered 2019-08-14 – 2019-08-15 (×2): 40 mg via ORAL
  Filled 2019-08-14 (×2): qty 1

## 2019-08-14 MED ORDER — SULFAMETHOXAZOLE-TRIMETHOPRIM 800-160 MG PO TABS
1.0000 | ORAL_TABLET | Freq: Every day | ORAL | Status: DC
  Administered 2019-08-15: 1 via ORAL
  Filled 2019-08-14: qty 1

## 2019-08-14 MED ORDER — BACLOFEN 10 MG PO TABS
10.0000 mg | ORAL_TABLET | Freq: Three times a day (TID) | ORAL | Status: DC
  Administered 2019-08-14 – 2019-08-15 (×2): 10 mg via ORAL
  Filled 2019-08-14 (×2): qty 1

## 2019-08-14 MED ORDER — SODIUM CHLORIDE 0.9% FLUSH: 3.0000 mL | INTRAVENOUS | Status: DC | PRN

## 2019-08-14 NOTE — ED Triage Notes (Signed)
States he was referred her to have a paracentesis done due to swelling of abdomen. States radiology wants him checked to make sure he is stable prior to procedure

## 2019-08-14 NOTE — Assessment & Plan Note (Signed)
Macrocytic anemia in the setting of alcoholic cirrhosis. No alcohol since February. History of upper GI bleed in April 2021 with EGD revealing grade 1 esophageal varices, medium-sized hiatal hernia, Cameron erosions and portal gastropathy with some bleeding which was controlled with application of 2 clips.  Hemoglobin on 4/17 at discharge was 10.4.  On day of admission in May on 5/21 for SBP, hemoglobin was 8.9.  Slight drift down during admission to hemoglobin 7.9 on day of discharge (5/24).  He reported intermittent toilet tissue hematochezia which was chronic with known hemorrhoids.  No overt GI bleeding.  No colonoscopy procedure while inpatient. No repeat labs. Denies bright red blood per rectum or melena. Maintaining on Protonix BID.   He needs hemoglobin rechecked. Also needs first ever colonoscopy but will hold off on scheduling as I am sending him to the emergency room due to symptomatic low blood pressure as discussed above.

## 2019-08-14 NOTE — Assessment & Plan Note (Addendum)
54 year old male with decompensated alcoholic cirrhosis, MELD 27, complicated by significant ascites/anasarca that has been difficult to manage with oral diuretics with soft blood pressures and low-sodium.  He has required frequent paracentesis.  Admitted in April 2021 with upper GI bleed.  EGD with grade 1 esophageal varices, medium-sized ventral hernia with Cameron erosions, portal gastropathy with some bleeding s/p application of 2 clips.  Admitted in May 2021 for SBP, maintaining on Bactrim for SBP prophylaxis.  Last paracentesis 07/28/2019 yielding 5.8 L, fluid analysis negative.  Unfortunately, patient reports fluid reaccumulates within 24 to 48 hours.  Doppler ultrasound in May 2020 with patent portal vein.  Additionally with mild hepatic encephalopathy that improved on lactulose and Xifaxan. Waiting for patient assistance approval for Xifaxan through health department. Today, abdomen is quite distended and mildly tense, 3+ pitting edema to his knees bilaterally.  Denies abdominal pain, melena, or recent BRBPR.  Last hemoglobin on file 7.6 on 5/24 at discharge.  BP 95/61, pulse 93.  Admits to lightheadedness, dizziness, seeing spots, nausea.  He is scheduled for paracentesis today.  Due to symptomatic low blood pressure, I am sending patient to the emergency room.  I feel he would tolerate his paracentesis today as an outpatient.  I suspect he is likely intravascularly depleted and may very well need inpatient management of his anasarca/ascites.  Not sure he is going to be able to maintain on current Lasix/Aldactone dosing.  He is scheduled to see [facility] transplant on 7/21.  Considered TIPS procedure but not he would be a good candidate for this considering he already has mild hepatic encephalopathy.   Proceed to [facility] ED. Continue Bactrim DS daily forever.  Continue Lasix/Aldactone for now. May need Lasix/Aldactone adjusted. Will leave to ED/inpatient management as they will be checking labs etc.   Continue Lactulose BID.  Continue Xifaxan BID. Samples provided today in hopes to hold him over until approved by health department.  Low sodium diet, no more than 2g daily.  Get Hep A/B vaccination when able. He already has Rx.  Due for [place] in November 2021.  Due for EGD in April 2022 Follow-up in 2-4 weeks.

## 2019-08-14 NOTE — Assessment & Plan Note (Signed)
Symptomatic low blood pressure at 95/61. States he is lightheaded, dizzy, seeing spots, nauseated, and overall feeling drained which started 6/7. Significant peripheral edema/ascites on Lasix and aldactone. Scheduled for paracentesis today which I do not think he would tolerate as an outpatient. I have advised patient to proceed to the emergency room as I suspect he is intravascularly depleted and may very well need inpatient management for ascites/peripheral edema. Not sure he will continue to tolerate his Lasix/Spironolactone dosing. Will defer to ED as they will also be able to see electrolytes and kidney function to offer further recommendations.

## 2019-08-14 NOTE — H&P (Signed)
History and Physical:    Alan Phillips   PZW:[id number] DOB: [birth year] DOA: 08/14/2019  Referring MD/provider: Dr. Juleen China PCP: Jacquelin Hawking, PA-C   Patient coming from: Home  Chief Complaint: Sent from outpatient [facility] for blood pressure in the 90s prior to paracentesis  History of Present Illness:   Alan Phillips is an 54 y.o. male with known history of decompensated alcoholic cirrhosis requiring therapeutic paracenteses on a regular basis, history of GI bleed secondary to varices and recent admission for asymptomatic SBP with E. coli  who was sent from outpatient office today for blood pressure in the 90s on a day that he is supposed to get an elective paracentesis.  Patient states that he feels at baseline.  He denies any dizziness or orthostatic symptoms.  He denies feeling weaker than usual.  He notes he has been compliant with his medications.  He has no acute complaints.  Patient denies any fevers chills or malaise.  No cough no shortness of breath no nausea or vomiting.  Patient has baseline diarrhea from his lactulose which she has been compliant with.  Patient denies feeling confused.  ED Course:  The patient was noted to have blood pressure 90/59 on admission.  Labs are at baseline.  H&H are stable in fact slightly improved from previously suggesting some level of possible hemoconcentration/intravascular volume depletion.  Patient is admitted for SBP in the 90s.  ROS:   ROS   Review of Systems: As per HPI  Past Medical History:   Past Medical History:  Diagnosis Date  . Cirrhosis of liver (HCC)   . Depression   . Psoriasis     Past Surgical History:   Past Surgical History:  Procedure Laterality Date  . BIOPSY  06/20/2019   Procedure: BIOPSY;  Surgeon: West Bali, MD;  [facility];  Service: Endoscopy;;  gastric  . ESOPHAGEAL BANDING N/A 06/20/2019   Procedure: ESOPHAGEAL BANDING;  Surgeon: West Bali, MD;  Location: [facility];  Service: Endoscopy;  Laterality: N/A;  . ESOPHAGOGASTRODUODENOSCOPY (EGD) WITH PROPOFOL N/A 06/20/2019   Procedure: ESOPHAGOGASTRODUODENOSCOPY (EGD) WITH PROPOFOL;  Surgeon: West Bali, MD;  grade 1 esophageal varices, medium sized hiatal hernia, Cameron erosions, and portal gastropathy with some bleeding which was controlled with application of 2 clips. Bx: Neg H. pylori  . none      Social History:   Social History   Socioeconomic History  . Marital status: Married    Spouse name: Not on file  . Number of children: Not on file  . Years of education: Not on file  . Highest education level: Not on file  Occupational History  . Not on file  Tobacco Use  . Smoking status: Former Smoker    Packs/day: 0.50    Years: 42.00    Pack years: 21.00    Types: Cigarettes    Quit date: 12/05/2018    Years since quitting: 0.6  . Smokeless tobacco: Former Neurosurgeon    Quit date: 05/14/1986  . Tobacco comment: quit last year  Vaping Use  . Vaping Use: Never used  Substance and Sexual Activity  . Alcohol use: Not Currently    Comment: h/o longtime etoh abuse but quit 04/2019 when he found out he has cirrhosis (05/14/19)  . Drug use: Yes    Types: Marijuana    Comment: few times a week   . Sexual activity: Not on file  Other Topics Concern  . Not on  file  Social History Narrative  . Not on file   Social Determinants of Health   Financial Resource Strain:   . Difficulty of Paying Living Expenses:   Food Insecurity:   . Worried About Programme researcher, broadcasting/film/video in the Last Year:   . Barista in the Last Year:   Transportation Needs:   . Freight forwarder (Medical):   Marland Kitchen Lack of Transportation (Non-Medical):   Physical Activity:   . Days of Exercise per Week:   . Minutes of Exercise per Session:   Stress:   . Feeling of Stress :   Social Connections:   . Frequency of Communication with Friends and Family:   . Frequency of Social Gatherings with Friends and Family:     . Attends Religious Services:   . Active Member of Clubs or Organizations:   . Attends Banker Meetings:   Marland Kitchen Marital Status:   Intimate Partner Violence:   . Fear of Current or Ex-Partner:   . Emotionally Abused:   Marland Kitchen Physically Abused:   . Sexually Abused:     Allergies   Codeine  Family history:   Family History  Problem Relation Age of Onset  . Cirrhosis Paternal Uncle        etoh  . Cancer Paternal Uncle   . Alcohol abuse Paternal Uncle   . Alzheimer's disease Father   . Heart failure Maternal Uncle     Current Medications:   Prior to Admission medications   Medication Sig Start Date End Date Taking? Authorizing Provider  baclofen (LIORESAL) 10 MG tablet Take 1 tablet (10 mg total) by mouth 3 (three) times daily. 07/23/19 08/22/19 Yes Tiffany Kocher, PA-C  furosemide (LASIX) 40 MG tablet Take 40 mg by mouth daily. 07/23/19  Yes [provider]  Lactulose 20 GM/30ML SOLN Take 30 mLs (20 g total) by mouth in the morning and at bedtime. 06/17/19  Yes Tiffany Kocher, PA-C  pantoprazole (PROTONIX) 40 MG tablet Take 1 tablet (40 mg total) by mouth 2 (two) times daily. 06/21/19 06/20/20 Yes Erick Blinks, MD  rifaximin (XIFAXAN) 550 MG TABS tablet Take 1 tablet (550 mg total) by mouth 2 (two) times daily. 08/07/19  Yes Tiffany Kocher, PA-C  spironolactone (ALDACTONE) 100 MG tablet Take 1 tablet (100 mg total) by mouth daily. 07/23/19  Yes Tiffany Kocher, PA-C  sulfamethoxazole-trimethoprim (BACTRIM DS) 800-160 MG tablet Take 1 tablet by mouth daily. To prevent infection in your abdominal fluid. 08/06/19  Yes Tiffany Kocher, PA-C    Physical Exam:   Vitals:   08/14/19 1218 08/14/19 1219 08/14/19 1314  BP: 105/69  (!) 93/59  Pulse: 85  79  Resp: 16    Temp: 97.7 F (36.5 C)    TempSrc: Oral    SpO2: 100%  99%  Weight:  83 kg   Height:  5\' 9"  (1.753 m)      Physical Exam: Blood pressure (!) 93/59, pulse 79, temperature 97.7 F (36.5 C),  temperature source Oral, resp. rate 16, height 5\' 9"  (1.753 m), weight 83 kg, SpO2 99 %. Gen: Gaunt emaciated man with bitemporal wasting and jaundice sitting in bed with attentive partner at bedside. Eyes: Patient does have scleral icterus, conjunctiva mildly injected bilaterally. CVS: S1-S2, regulary, 2/6 systolic murmur Respiratory: Reasonable air entry bilaterally with decreased breath sounds at bases.  Low lung volumes. GI: NABS.  Ascites, belly is not tense and not hard but clearly has a  lot of fluid in it.  Nontender LE: Brawny 2-3+ edema in his lower extremities above his knees. Neuro: A/O x 3, Moving all extremities equally with normal strength, CN 3-12 intact, grossly nonfocal.  Psych: patient is logical and coherent, judgement and insight appear normal, mood and affect appropriate to situation. Skin: Patient is jaundiced   Data Review:    Labs: Basic Metabolic Panel: Recent Labs  Lab 08/14/19 1440  NA 129*  K 4.5  CL 97*  CO2 25  GLUCOSE 115*  BUN 15  CREATININE 1.15  CALCIUM 8.3*   Liver Function Tests: Recent Labs  Lab 08/14/19 1440  AST 41  ALT 20  ALKPHOS 99  BILITOT 3.5*  PROT 6.6  ALBUMIN 2.5*   No results for input(s): LIPASE, AMYLASE in the last 168 hours. No results for input(s): AMMONIA in the last 168 hours. CBC: Recent Labs  Lab 08/14/19 1440  WBC 6.8  NEUTROABS 4.5  HGB 9.4*  HCT 28.5*  MCV 109.2*  PLT 125*   Cardiac Enzymes: No results for input(s): CKTOTAL, CKMB, CKMBINDEX, TROPONINI in the last 168 hours.  BNP (last 3 results) No results for input(s): PROBNP in the last 8760 hours. CBG: No results for input(s): GLUCAP in the last 168 hours.  Urinalysis    Component Value Date/Time   COLORURINE YELLOW 07/25/2019 1700   APPEARANCEUR CLEAR 07/25/2019 1700   LABSPEC 1.012 07/25/2019 1700   PHURINE 5.0 07/25/2019 1700   GLUCOSEU NEGATIVE 07/25/2019 1700   HGBUR MODERATE (A) 07/25/2019 1700   BILIRUBINUR NEGATIVE 07/25/2019  1700   KETONESUR NEGATIVE 07/25/2019 1700   PROTEINUR NEGATIVE 07/25/2019 1700   NITRITE NEGATIVE 07/25/2019 1700   LEUKOCYTESUR NEGATIVE 07/25/2019 1700      Radiographic Studies: No results found.  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  Patient feels at baseline.  Blood pressure Discussed with Walden Field from GI, will provide 25 g albumin tonight and tomorrow morning in hopes that his blood pressure will come up prior to paracentesis tomorrow. Apparently they work closely with IR and have albumin protocol to be used during the paracentesis. US paracentesis order entered.  Ascites For paracentesis in a.m. as noted above. Will place order for Gram stain culture especially given history of asymptomatic SBP in past. Patient has been on Augmentin for prophylaxis, no Cipro due to previous QT prolongation.  Cirrhosis Will hold furosemide tonight Continue spironolactone and lactulose  Hyponatremia Secondary to fluid overload, asymptomatic, at baseline  History of GI bleed H&H are stable indeed hemoglobin is a little elevated.    Other information:   DVT prophylaxis: None ordered.,  Patient is auto anticoagulated and is an outpatient. Code Status: Full Family Communication: Patient spouse was at bedside throughout Disposition Plan: Home Consults called: Curbside with GI, Walden Field Admission status: Observation  Katarzyna Wolven Tublu Adrielle Polakowski [facility]  If 7PM-7AM, please contact night-coverage [email or web address] Password [facility] 08/14/2019, 3:51 PM

## 2019-08-14 NOTE — Assessment & Plan Note (Addendum)
Chronic history of intermittent toilet tissue hematochezia in the setting of known hemorrhoids.  No hematochezia since hospitalization in May 2021. Notably, hemoglobin on 5/24 at time of hospital discharge was 7.6, down from 8.9 on admission. No TCS inpatient as he was without significant rectal bleeding.  Previously underwent EGD in April 2021 for upper GI bleed revealing grade 1 esophageal varices, medium-sized hiatal hernia, Cameron erosions, and portal gastropathy with some bleeding s/p placement of 2 clips. Maintaining on PPI BID. He does have history of cirrhosis an is currently taking Bactrim for SBP prophylaxis.   He needs first ever colonoscopy.  We will hold off on scheduling at this point as I am sending him to the emergency room due to symptomatic low blood pressure and likely need for inpatient management of ascites/peripheral edema.  Planning to see him back in 2 to 4 weeks.

## 2019-08-14 NOTE — ED Provider Notes (Signed)
[facility] EMERGENCY DEPARTMENT Provider Note   CSN: [id number] Arrival date & time: 08/14/19  1135     History Chief Complaint  Patient presents with  . Bloated    Alan Phillips is a 54 y.o. male.  HPI   54yM sent from GI office. Hx of decompensated alcoholic cirrhosis. Gets regular scheduled paracenteses. Today in office he was hypotensive and mildly symptomatic. Scheduled for paracentesis today but won't be done because of his BP. He reports he has been eating/drinking like he normally does. Reports compliance with his medications. Abdomen is distended but denies any acute pain. No fever.   Past Medical History:  Diagnosis Date  . Cirrhosis of liver (HCC)   . Depression   . Psoriasis     Patient Active Problem List   Diagnosis Date Noted  . Rectal bleeding 08/14/2019  . Low blood pressure 08/14/2019  . SBP (spontaneous bacterial peritonitis) (HCC) 07/25/2019  . Acute GI bleeding 06/19/2019  . Hematemesis 06/18/2019  . Hyponatremia   . Hypokalemia   . Macrocytic anemia   . Coagulopathy (HCC)   . Prolonged QT interval   . Hepatic cirrhosis (HCC) 05/14/2019  . Anasarca 05/14/2019    Past Surgical History:  Procedure Laterality Date  . BIOPSY  06/20/2019   Procedure: BIOPSY;  Surgeon: West Bali, MD;  [facility];  Service: Endoscopy;;  gastric  . ESOPHAGEAL BANDING N/A 06/20/2019   Procedure: ESOPHAGEAL BANDING;  Surgeon: West Bali, MD;  Location: [facility];  Service: Endoscopy;  Laterality: N/A;  . ESOPHAGOGASTRODUODENOSCOPY (EGD) WITH PROPOFOL N/A 06/20/2019   Procedure: ESOPHAGOGASTRODUODENOSCOPY (EGD) WITH PROPOFOL;  Surgeon: West Bali, MD;  grade 1 esophageal varices, medium sized hiatal hernia, Cameron erosions, and portal gastropathy with some bleeding which was controlled with application of 2 clips. Bx: Neg H. pylori  . none         Family History  Problem Relation Age of Onset  . Cirrhosis Paternal Uncle        etoh    . Cancer Paternal Uncle   . Alcohol abuse Paternal Uncle   . Alzheimer's disease Father   . Heart failure Maternal Uncle     Social History   Tobacco Use  . Smoking status: Former Smoker    Packs/day: 0.50    Years: 42.00    Pack years: 21.00    Types: Cigarettes    Quit date: 12/05/2018    Years since quitting: 0.6  . Smokeless tobacco: Former Neurosurgeon    Quit date: 05/14/1986  . Tobacco comment: quit last year  Vaping Use  . Vaping Use: Never used  Substance Use Topics  . Alcohol use: Not Currently    Comment: h/o longtime etoh abuse but quit 04/2019 when he found out he has cirrhosis (05/14/19)  . Drug use: Yes    Types: Marijuana    Comment: few times a week     Home Medications Prior to Admission medications   Medication Sig Start Date End Date Taking? Authorizing Provider  baclofen (LIORESAL) 10 MG tablet Take 1 tablet (10 mg total) by mouth 3 (three) times daily. 07/23/19 08/22/19 Yes Tiffany Kocher, PA-C  furosemide (LASIX) 40 MG tablet Take 40 mg by mouth daily. 07/23/19  Yes [provider]  Lactulose 20 GM/30ML SOLN Take 30 mLs (20 g total) by mouth in the morning and at bedtime. 06/17/19  Yes Tiffany Kocher, PA-C  pantoprazole (PROTONIX) 40 MG tablet Take 1 tablet (40  mg total) by mouth 2 (two) times daily. 06/21/19 06/20/20 Yes Kathie Dike, MD  rifaximin (XIFAXAN) 550 MG TABS tablet Take 1 tablet (550 mg total) by mouth 2 (two) times daily. 08/07/19  Yes Mahala Menghini, PA-C  spironolactone (ALDACTONE) 100 MG tablet Take 1 tablet (100 mg total) by mouth daily. 07/23/19  Yes Mahala Menghini, PA-C  sulfamethoxazole-trimethoprim (BACTRIM DS) 800-160 MG tablet Take 1 tablet by mouth daily. To prevent infection in your abdominal fluid. 08/06/19  Yes Mahala Menghini, PA-C    Allergies    Codeine  Review of Systems   Review of Systems All systems reviewed and negative, other than as noted in HPI.  Physical Exam Updated Vital Signs BP (!) 93/59   Pulse 79    Temp 97.7 F (36.5 C) (Oral)   Resp 16   Ht 5\' 9"  (1.753 m)   Wt 83 kg   SpO2 99%   BMI 27.02 kg/m   Physical Exam Vitals and nursing note reviewed.  Constitutional:      General: He is not in acute distress.    Appearance: He is well-developed.  HENT:     Head: Normocephalic and atraumatic.  Eyes:     General:        Right eye: No discharge.        Left eye: No discharge.     Conjunctiva/sclera: Conjunctivae normal.  Cardiovascular:     Rate and Rhythm: Normal rate and regular rhythm.     Heart sounds: Normal heart sounds. No murmur heard.  No friction rub. No gallop.   Pulmonary:     Effort: Pulmonary effort is normal. No respiratory distress.     Breath sounds: Normal breath sounds.  Abdominal:     General: There is distension.     Palpations: Abdomen is soft.     Tenderness: There is no abdominal tenderness.     Comments: Distended. Somewhat tense. Nontender. Small reducible umbilical hernia.   Musculoskeletal:        General: No tenderness.     Cervical back: Neck supple.  Skin:    General: Skin is warm and dry.  Neurological:     Mental Status: He is alert.  Psychiatric:        Behavior: Behavior normal.        Thought Content: Thought content normal.     ED Results / Procedures / Treatments   Labs (all labs ordered are listed, but only abnormal results are displayed) Labs Reviewed  CBC WITH DIFFERENTIAL/PLATELET - Abnormal; Notable for the following components:      Result Value   RBC 2.61 (*)    Hemoglobin 9.4 (*)    HCT 28.5 (*)    MCV 109.2 (*)    MCH 36.0 (*)    Platelets 125 (*)    All other components within normal limits  COMPREHENSIVE METABOLIC PANEL - Abnormal; Notable for the following components:   Sodium 129 (*)    Chloride 97 (*)    Glucose, Bld 115 (*)    Calcium 8.3 (*)    Albumin 2.5 (*)    Total Bilirubin 3.5 (*)    All other components within normal limits  PROTIME-INR - Abnormal; Notable for the following components:    Prothrombin Time 19.4 (*)    INR 1.7 (*)    All other components within normal limits  COMPREHENSIVE METABOLIC PANEL - Abnormal; Notable for the following components:   Sodium 131 (*)    Calcium  8.3 (*)    Total Protein 5.8 (*)    Albumin 2.5 (*)    Total Bilirubin 2.6 (*)    All other components within normal limits  CBC - Abnormal; Notable for the following components:   RBC 2.27 (*)    Hemoglobin 8.2 (*)    HCT 24.5 (*)    MCV 107.9 (*)    MCH 36.1 (*)    Platelets 121 (*)    All other components within normal limits  APTT - Abnormal; Notable for the following components:   aPTT 46 (*)    All other components within normal limits  PROTIME-INR - Abnormal; Notable for the following components:   Prothrombin Time 20.5 (*)    INR 1.8 (*)    All other components within normal limits  AMMONIA    EKG None  Radiology No results found.  Procedures Procedures (including critical care time)  Medications Ordered in ED Medications  albumin human 25 % solution 25 g (25 g Intravenous New Bag/Given 08/14/19 1448)    ED Course  I have reviewed the triage vital signs and the nursing notes.  Pertinent labs & imaging results that were available during my care of the patient were reviewed by me and considered in my medical decision making (see chart for details).    MDM Rules/Calculators/A&P                          54yM with hypotension precluding paracentesis. Apparently radiology typically stops when his BP gets to 90s systolic and that is his starting point today. Symptomatic in GI office but he has little in terms of acute complaints for me. Will give albumin to see if we can improve his BP. Admit.   Final Clinical Impression(s) / ED Diagnoses Final diagnoses:  Ascites due to alcoholic cirrhosis [facility])    Rx / DC Orders ED Discharge Orders    None       Raeford Razor, MD 08/15/19 [phone]

## 2019-08-14 NOTE — Patient Instructions (Addendum)
I recommend you proceed to the emergency department for further evaluation.  I am concerned about your lightheadedness, dizziness, seeing spots, and low blood pressure.  Not think you will tolerate paracentesis for today outpatient setting.  You are likely going to need inpatient management.  I will go ahead and provide you with Xifaxan samples as you are awaiting for response from the health department.  Continue taking lactulose twice a day.  Titrate this to achieve release 3-4 soft bowel movements daily.  Continue taking Lasix and Aldactone.  Continue following a strict low-sodium diet.  No more than 2000 mg daily.  Continue taking Bactrim daily forever.  We will plan to see you back in 2-4 weeks.  Alan Memos, PA-C [facility]

## 2019-08-15 ENCOUNTER — Observation Stay (HOSPITAL_COMMUNITY): Payer: Self-pay

## 2019-08-15 ENCOUNTER — Telehealth: Payer: Self-pay | Admitting: Gastroenterology

## 2019-08-15 ENCOUNTER — Encounter (HOSPITAL_COMMUNITY): Payer: Self-pay | Admitting: Internal Medicine

## 2019-08-15 DIAGNOSIS — E871 Hypo-osmolality and hyponatremia: Secondary | ICD-10-CM

## 2019-08-15 LAB — COMPREHENSIVE METABOLIC PANEL
ALT: 17 U/L (ref 0–44)
AST: 31 U/L (ref 15–41)
Albumin: 2.5 g/dL — ABNORMAL LOW (ref 3.5–5.0)
Alkaline Phosphatase: 92 U/L (ref 38–126)
Anion gap: 6 (ref 5–15)
BUN: 13 mg/dL (ref 6–20)
CO2: 23 mmol/L (ref 22–32)
Calcium: 8.3 mg/dL — ABNORMAL LOW (ref 8.9–10.3)
Chloride: 102 mmol/L (ref 98–111)
Creatinine, Ser: 0.97 mg/dL (ref 0.61–1.24)
GFR calc Af Amer: >60 mL/min (ref >60)
GFR calc non Af Amer: >60 mL/min (ref >60)
Glucose, Bld: 96 mg/dL (ref 70–99)
Potassium: 4.2 mmol/L (ref 3.5–5.1)
Sodium: 131 mmol/L — ABNORMAL LOW (ref 135–145)
Total Bilirubin: 2.6 mg/dL — ABNORMAL HIGH (ref 0.3–1.2)
Total Protein: 5.8 g/dL — ABNORMAL LOW (ref 6.5–8.1)

## 2019-08-15 LAB — BODY FLUID CELL COUNT WITH DIFFERENTIAL
Eos, Fluid: 0 %
Lymphs, Fluid: 78 %
Monocyte-Macrophage-Serous Fluid: 19 % — ABNORMAL LOW (ref 50–90)
Neutrophil Count, Fluid: 3 % (ref 0–25)
Total Nucleated Cell Count, Fluid: 56 cu mm (ref 0–1000)

## 2019-08-15 LAB — GRAM STAIN

## 2019-08-15 LAB — CBC
HCT: 24.5 % — ABNORMAL LOW (ref 39.0–52.0)
Hemoglobin: 8.2 g/dL — ABNORMAL LOW (ref 13.0–17.0)
MCH: 36.1 pg — ABNORMAL HIGH (ref 26.0–34.0)
MCHC: 33.5 g/dL (ref 30.0–36.0)
MCV: 107.9 fL — ABNORMAL HIGH (ref 80.0–100.0)
Platelets: 121 10*3/uL — ABNORMAL LOW (ref 150–400)
RBC: 2.27 MIL/uL — ABNORMAL LOW (ref 4.22–5.81)
RDW: 15.1 % (ref 11.5–15.5)
WBC: 5.8 10*3/uL (ref 4.0–10.5)
nRBC: 0 % (ref 0.0–0.2)

## 2019-08-15 LAB — PROTIME-INR
INR: 1.8 — ABNORMAL HIGH (ref 0.8–1.2)
Prothrombin Time: 20.5 seconds — ABNORMAL HIGH (ref 11.4–15.2)

## 2019-08-15 LAB — APTT: aPTT: 46 seconds — ABNORMAL HIGH (ref 24–36)

## 2019-08-15 NOTE — Progress Notes (Signed)
Paracentesis complete no signs of distress.  

## 2019-08-15 NOTE — Progress Notes (Signed)
Cc'ed to pcp °

## 2019-08-15 NOTE — Telephone Encounter (Signed)
Alan Phillips, I know patient is on cancellation list as we do not have any openings for a couple of months. If you could make a note for him, it would be best for him to have follow-up with Tana Coast as he has been seeing her regularly outpatient. Obviously, if she doesn't have an opening and someone else does in the next 4 weeks, that is ok too.

## 2019-08-15 NOTE — Discharge Instructions (Signed)
Cirrhosis  Cirrhosis is long-term (chronic) liver injury. The liver is the body's largest internal organ, and it performs many functions. It converts food into energy, removes toxic material from the blood, makes important proteins, and absorbs necessary vitamins from food. In cirrhosis, healthy liver cells are replaced by scar tissue. This prevents blood from flowing through the liver, making it difficult for the liver to function. Scarring of the liver cannot be reversed, but treatment can prevent it from getting worse. What are the causes? Common causes of this condition are hepatitis C and long-term alcohol abuse. Other causes include:  Nonalcoholic fatty liver disease. This happens when fat is deposited in the liver by causes other than alcohol.  Hepatitis B infection.  Autoimmune hepatitis. In this condition, the body's defense system (immune system) mistakenly attacks the liver cells, causing irritation and swelling (inflammation).  Diseases that cause blockage of ducts inside the liver.  Inherited liver diseases, such as hemochromatosis. This is one of the most common inherited liver diseases. In this disease, deposits of iron collect in the liver and other organs.  Reactions to certain long-term medicines, such as amiodarone, a heart medicine.  Parasitic infections. These include schistosomiasis, which is caused by a flatworm.  Long-term contact to certain toxins. These toxins include certain organic solvents, such as toluene and chloroform. What increases the risk? You are more likely to develop this condition if:  You have certain types of viral hepatitis.  You abuse alcohol, especially if you are male.  You are overweight.  You share needles.  You have unprotected sex with someone who has viral hepatitis. What are the signs or symptoms? You may not have any signs and symptoms at first. Symptoms may not develop until the damage to your liver starts to get worse. Early  symptoms may include:  Weakness and tiredness (fatigue).  Changes in sleep patterns or having trouble sleeping.  Itchiness.  Tenderness in the right-upper part of your abdomen.  Weight loss and muscle loss.  Nausea.  Loss of appetite.  Appearance of tiny blood vessels under the skin. Later symptoms may include:  Fatigue or weakness that is getting worse.  Yellow skin and eyes (jaundice).  Buildup of fluid in the abdomen (ascites). You may notice that your clothes are tight around your waist.  Weight gain.  Swelling of the feet and ankles (edema).  Trouble breathing.  Easy bruising and bleeding.  Vomiting blood.  Black or bloody stool.  Mental confusion. How is this diagnosed? Your health care provider may suspect cirrhosis based on your symptoms and medical history, especially if you have other medical conditions or a history of alcohol abuse. Your health care provider will do a physical exam to feel your liver and to check for signs of cirrhosis. He or she may perform other tests, including:  Blood tests to check: ? For hepatitis B or C. ? Kidney function. ? Liver function.  Imaging tests such as: ? MRI or CT scan to look for changes seen in advanced cirrhosis. ? Ultrasound to see if normal liver tissue is being replaced by scar tissue.  A procedure in which a long needle is used to take a sample of liver tissue to be checked in a lab (biopsy). Liver biopsy can confirm the diagnosis of cirrhosis. How is this treated? Treatment for this condition depends on how damaged your liver is and what caused the damage. It may include treating the symptoms of cirrhosis, or treating the underlying causes in order to   slow the damage. Treatment may include:  Making lifestyle changes, such as: ? Eating a healthy diet. You may need to work with your health care provider or a diet and nutrition specialist (dietitian) to develop an eating plan. ? Restricting salt  intake. ? Maintaining a healthy weight. ? Not abusing drugs or alcohol.  Taking medicines to: ? Treat liver infections or other infections. ? Control itching. ? Reduce fluid buildup. ? Reduce certain blood toxins. ? Reduce risk of bleeding from enlarged blood vessels in the stomach or esophagus (varices).  Liver transplant. In this procedure, a liver from a donor is used to replace your diseased liver. This is done if cirrhosis has caused liver failure. Other treatments and procedures may be done depending on the problems that you get from cirrhosis. Common problems include liver-related kidney failure (hepatorenal syndrome). Follow these instructions at home:   Take medicines only as told by your health care provider. Do not use medicines that are toxic to your liver. Ask your health care provider before taking any new medicines, including over-the-counter medicines.  Rest as needed.  Eat a well-balanced diet. Ask your health care provider or dietitian for more information.  Limit your salt or water intake, if your health care provider asks you to do this.  Do not drink alcohol. This is especially important if you are taking acetaminophen.  Keep all follow-up visits as told by your health care provider. This is important. Contact a health care provider if you:  Have fatigue or weakness that is getting worse.  Develop swelling of the hands, feet, legs, or face.  Have a fever.  Develop loss of appetite.  Have nausea or vomiting.  Develop jaundice.  Develop easy bruising or bleeding. Get help right away if you:  Vomit bright red blood or a material that looks like coffee grounds.  Have blood in your stools.  Notice that your stools appear black and tarry.  Become confused.  Have chest pain or trouble breathing. Summary  Cirrhosis is chronic liver injury. Liver damage cannot be reversed. Common causes are hepatitis C and long-term alcohol abuse.  Tests used to  diagnose cirrhosis include blood tests, imaging tests, and liver biopsy.  Treatment for this condition involves treating the underlying cause. Avoid alcohol, drugs, salt, and medicines that may damage your liver.  Contact your health care provider if you develop ascites, edema, jaundice, fever, nausea or vomiting, easy bruising or bleeding, or worsening fatigue. This information is not intended to replace advice given to you by your health care provider. Make sure you discuss any questions you have with your health care provider. Document Revised: 06/12/2018 Document Reviewed: 01/10/2017 [employer] Patient Education  2020 [employer]  

## 2019-08-15 NOTE — Plan of Care (Signed)

## 2019-08-15 NOTE — Discharge Summary (Signed)
Physician Discharge Summary  Alan Phillips KYH:[id number] DOB: Jul 23, 1965 DOA: 08/14/2019  PCP: Jacquelin Hawking, PA-C GI: [facility]   Admit date: 08/14/2019 Discharge date: 08/15/2019  Admitted From:  Home  Disposition:  Home   Recommendations for Outpatient Follow-up:  1. Follow up with PCP as scheduled 2. Please follow up with [facility] as scheduled  3. Please continue regularly scheduled paracentesis procedures  4. Please obtain BMP/CBC in 1-2 weeks to follow up  5. Please follow up on the following pending results: final fluid cytology results and cultures from paracentesis 08/15/19 6. Outpatient palliative medicine consultation recommended for goals of care  Discharge Condition: STABLE but GUARDED   CODE STATUS: FULL    Brief Hospitalization Summary: Please see all hospital notes, images, labs for full details of the hospitalization. ADMISSION HPI:  Alan Phillips is an 54 y.o. male with known history of decompensated alcoholic cirrhosis requiring therapeutic paracenteses on a regular basis, history of GI bleed secondary to varices and recent admission for asymptomatic SBP with E. coli  who was sent from outpatient office today for blood pressure in the 90s on a day that he is supposed to get an elective paracentesis.  Patient states that he feels at baseline.  He denies any dizziness or orthostatic symptoms.  He denies feeling weaker than usual.  He notes he has been compliant with his medications.  He has no acute complaints.  Patient denies any fevers chills or malaise.  No cough no shortness of breath no nausea or vomiting.  Patient has baseline diarrhea from his lactulose which she has been compliant with.  Patient denies feeling confused.  ED Course:  The patient was noted to have blood pressure 90/59 on admission.  Labs are at baseline.  H&H are stable in fact slightly improved from previously suggesting some level of possible hemoconcentration/intravascular volume  depletion.  Patient is admitted for SBP in the 90s.  54 year old with decompensated alcoholic cirrhosis is admitted for albumin prior to paracentesis in the morning.  Patient feels at baseline.  Blood pressure Discussed with Wynne Dust from GI, gave 25 g albumin x 2 doses for paracentesis 08/15/19.  His BP has remained low but stable and tolerated procedure well.   Ascites Paracentesis was successful and 5L of fluid was removed and tested, no findings to suggest SBP, continue Bactrim prophylaxis. Close follow up with [facility].    Cirrhosis Resume home meds.   Hyponatremia Secondary to fluid overload, asymptomatic, at baseline  History of GI bleed H&H are stable indeed hemoglobin is a little elevated.  Discharge Diagnoses:  Principal Problem:   Ascites due to alcoholic cirrhosis (HCC) Active Problems:   Hepatic cirrhosis (HCC)   Anasarca   Hyponatremia   Low blood pressure   Discharge Instructions:  Allergies as of 08/15/2019      Reactions   Codeine Hives      Medication List    TAKE these medications   baclofen 10 MG tablet Commonly known as: LIORESAL Take 1 tablet (10 mg total) by mouth 3 (three) times daily.   furosemide 40 MG tablet Commonly known as: LASIX Take 40 mg by mouth daily.   Lactulose 20 GM/30ML Soln Take 30 mLs (20 g total) by mouth in the morning and at bedtime.   pantoprazole 40 MG tablet Commonly known as: Protonix Take 1 tablet (40 mg total) by mouth 2 (two) times daily.   rifaximin 550 MG Tabs tablet Commonly known as: XIFAXAN Take 1 tablet (550 mg total)  by mouth 2 (two) times daily.   spironolactone 100 MG tablet Commonly known as: Aldactone Take 1 tablet (100 mg total) by mouth daily.   sulfamethoxazole-trimethoprim 800-160 MG tablet Commonly known as: Bactrim DS Take 1 tablet by mouth daily. To prevent infection in your abdominal fluid.       Follow-up Information    [facility]. Schedule  an appointment as soon as possible for a visit in 1 week(s).   Why: Hospital Follow Up Contact information: [address] [place] [phone]       Soyla Dryer, PA-C. Schedule an appointment as soon as possible for a visit in 2 week(s).   Specialty: Physician Assistant Contact information: [place] [address] [phone]              Allergies  Allergen Reactions  . Codeine Hives   Allergies as of 08/15/2019      Reactions   Codeine Hives      Medication List    TAKE these medications   baclofen 10 MG tablet Commonly known as: LIORESAL Take 1 tablet (10 mg total) by mouth 3 (three) times daily.   furosemide 40 MG tablet Commonly known as: LASIX Take 40 mg by mouth daily.   Lactulose 20 GM/30ML Soln Take 30 mLs (20 g total) by mouth in the morning and at bedtime.   pantoprazole 40 MG tablet Commonly known as: Protonix Take 1 tablet (40 mg total) by mouth 2 (two) times daily.   rifaximin 550 MG Tabs tablet Commonly known as: XIFAXAN Take 1 tablet (550 mg total) by mouth 2 (two) times daily.   spironolactone 100 MG tablet Commonly known as: Aldactone Take 1 tablet (100 mg total) by mouth daily.   sulfamethoxazole-trimethoprim 800-160 MG tablet Commonly known as: Bactrim DS Take 1 tablet by mouth daily. To prevent infection in your abdominal fluid.       Procedures/Studies: US Paracentesis  Result Date: 08/15/2019 INDICATION: Alcoholic cirrhosis, ascites EXAM: ULTRASOUND GUIDED DIAGNOSTIC AND THERAPEUTIC PARACENTESIS MEDICATIONS: None COMPLICATIONS: None immediate PROCEDURE: Informed written consent was obtained from the patient after a discussion of the risks, benefits and alternatives to treatment. A timeout was performed prior to the initiation of the procedure. Initial ultrasound scanning demonstrates a large amount of ascites within the LEFT lower abdominal quadrant. The right lower abdomen was  prepped and draped in the usual sterile fashion. 1% lidocaine was used for local anesthesia. Following this, a 5 [place] Yueh catheter was introduced. An ultrasound image was saved for documentation purposes. The paracentesis was performed. The catheter was removed and a dressing was applied. The patient tolerated the procedure well without immediate post procedural complication. Patient received post-procedure intravenous albumin; see nursing notes for details. FINDINGS: A total of approximately 5.2 L of yellow ascitic fluid was removed. Samples were sent to the laboratory as requested by the clinical team. IMPRESSION: Successful ultrasound-guided paracentesis yielding 5.2 liters of peritoneal fluid. Electronically Signed   By: Lavonia Dana M.D.   On: 08/15/2019 11:48   US Paracentesis  Result Date: 07/28/2019 INDICATION: Alcoholic cirrhosis, ascites EXAM: ULTRASOUND GUIDED DIAGNOSTIC AND THERAPEUTIC PARACENTESIS MEDICATIONS: None COMPLICATIONS: None immediate PROCEDURE: Informed written consent was obtained from the patient after a discussion of the risks, benefits and alternatives to treatment. A timeout was performed prior to the initiation of the procedure. Initial ultrasound scanning demonstrates a large amount of ascites within the right lower abdominal quadrant. The right lower abdomen was prepped and draped  in the usual sterile fashion. 1% lidocaine was used for local anesthesia. Following this, a 5 [place] Yueh catheter was introduced. An ultrasound image was saved for documentation purposes. The paracentesis was performed. The catheter was removed and a dressing was applied. The patient tolerated the procedure well without immediate post procedural complication. Patient received post-procedure intravenous albumin; see nursing notes for details. FINDINGS: A total of approximately 5.18 L of yellow ascitic fluid was removed. Samples were sent to the laboratory as requested by the clinical team. IMPRESSION:  Successful ultrasound-guided paracentesis yielding 5.18 liters of peritoneal fluid. Electronically Signed   By: Ulyses Southward M.D.   On: 07/28/2019 10:56   US Paracentesis  Result Date: 07/24/2019 INDICATION: Cirrhosis, positive culture at prior paracentesis, reassessment EXAM: ULTRASOUND GUIDED DIAGNOSTIC PARACENTESIS MEDICATIONS: None COMPLICATIONS: None immediate PROCEDURE: Informed written consent was obtained from the patient after a discussion of the risks, benefits and alternatives to treatment. A timeout was performed prior to the initiation of the procedure. Initial ultrasound scanning demonstrates a large amount of ascites within the right lower abdominal quadrant. The right lower abdomen was prepped and draped in the usual sterile fashion. 1% lidocaine was used for local anesthesia. Following this, a 5 [place] Yueh catheter was introduced. An ultrasound image was saved for documentation purposes. The paracentesis was performed. The catheter was removed and a dressing was applied. The patient tolerated the procedure well without immediate post procedural complication. FINDINGS: A total of approximately 180 mL of yellow ascitic fluid was removed. Samples were sent to the laboratory as requested by the clinical team. IMPRESSION: Successful ultrasound-guided paracentesis yielding 180 mL of peritoneal fluid. Electronically Signed   By: Ulyses Southward M.D.   On: 07/24/2019 15:23   US Paracentesis  Result Date: 07/22/2019 INDICATION: Cirrhosis, ascites EXAM: ULTRASOUND GUIDED DIAGNOSTIC AND THERAPEUTIC PARACENTESIS MEDICATIONS: None COMPLICATIONS: None immediate PROCEDURE: Informed written consent was obtained from the patient after a discussion of the risks, benefits and alternatives to treatment. A timeout was performed prior to the initiation of the procedure. Initial ultrasound scanning demonstrates a large amount of ascites within the right lower abdominal quadrant. The right lower abdomen was prepped and  draped in the usual sterile fashion. 1% lidocaine was used for local anesthesia. Following this, a 5 [place] Yueh catheter was introduced. An ultrasound image was saved for documentation purposes. The paracentesis was performed. The catheter was removed and a dressing was applied. The patient tolerated the procedure well without immediate post procedural complication. Patient received post-procedure intravenous albumin; see nursing notes for details. FINDINGS: A total of approximately 4.2 L of yellow ascitic fluid was removed. Volume removed was limited to 4 L at patient request, due to not having felt well when larger volumes have been removed previously. Samples were sent to the laboratory as requested by the clinical team. IMPRESSION: Successful ultrasound-guided paracentesis yielding 4.2 liters of peritoneal fluid. Electronically Signed   By: Ulyses Southward M.D.   On: 07/22/2019 14:20   US Paracentesis  Result Date: 07/16/2019 INDICATION: Ascites. EXAM: ULTRASOUND GUIDED therapeutic PARACENTESIS MEDICATIONS: None. COMPLICATIONS: None immediate. PROCEDURE: Informed written consent was obtained from the patient after a discussion of the risks, benefits and alternatives to treatment. A timeout was performed prior to the initiation of the procedure. Initial ultrasound scanning demonstrates a large amount of ascites within the right lower abdominal quadrant. The right lower abdomen was prepped and draped in the usual sterile fashion. 1% lidocaine was used for local anesthesia. Following this, a paracentesis catheter was  introduced. An ultrasound image was saved for documentation purposes. The paracentesis was performed. The catheter was removed and a dressing was applied. The patient tolerated the procedure well without immediate post procedural complication. FINDINGS: A total of approximately 4.2 L of serous fluid was removed. IMPRESSION: Successful ultrasound-guided paracentesis yielding 4.2 liters of peritoneal  fluid. Electronically Signed   By: Lupita Raider M.D.   On: 07/16/2019 14:00   DG Chest Port 1 View  Result Date: 07/25/2019 CLINICAL DATA:  Shortness of breath today, history cirrhosis, positive cultures following paracentesis on 07/22/2019 EXAM: PORTABLE CHEST 1 VIEW COMPARISON:  Portable exam 1541 hours compared to 06/18/2019 FINDINGS: Normal heart size, mediastinal contours, and pulmonary vascularity. Persistent LEFT pleural effusion and basilar atelectasis. Remaining lungs clear. No infiltrate or pneumothorax. IMPRESSION: Persistent small LEFT pleural effusion and LEFT basilar atelectasis. Electronically Signed   By: Ulyses Southward M.D.   On: 07/25/2019 15:50   US LIVER DOPPLER  Result Date: 07/28/2019 CLINICAL DATA:  History of cirrhosis requiring previous ultrasound-guided paracenteses. Evaluate hepatic vasculature. EXAM: DUPLEX ULTRASOUND OF LIVER TECHNIQUE: Color and duplex Doppler ultrasound was performed to evaluate the hepatic in-flow and out-flow vessels. COMPARISON:  Ultrasound-guided paracentesis - 07/22/2019 yielding 4.2 L of peritoneal fluid. FINDINGS: Liver: Marked nodularity of the hepatic contour compatible with provided history of cirrhosis. No discrete hepatic lesions. No intrahepatic biliary ductal dilatation. Main Portal Vein size: 11.8 cm Portal Vein Velocities Main Prox:  28 cm/sec Main Mid: 30 cm/sec Main Dist:  24 cm/sec Right: 25 cm/sec Left: 34 cm/sec Hepatic Vein Velocities Right:  43 cm/sec Middle:  26 cm/sec Left:  33 cm/sec IVC: Present and patent with normal respiratory phasicity. Hepatic Artery Velocity:  103 cm/sec Splenic Vein Velocity:  26 cm/sec Spleen: 9.1 cm x 5.6 cm x 11.2 cm with a total volume of 312.1 cm^3 (411 cm^3 is upper limit normal) Portal Vein Occlusion/Thrombus: No Splenic Vein Occlusion/Thrombus: No Ascites: Moderate recurrent intra-abdominal ascites. Varices: None visualized. IMPRESSION: 1. Nodularity of the hepatic contour compatible with provided  history of cirrhosis. No discrete hepatic lesions, however further evaluation with nonemergent abdominal MRI could be performed as indicated. 2. Patent hepatic vasculature with normal directional flows. 3. Moderate volume recurrent intra-abdominal ascites, findings compatible with portal venous hypertension. No evidence of splenomegaly. Electronically Signed   By: Simonne Come M.D.   On: 07/28/2019 12:33      Subjective: Pt tolerated paracentesis well and feeling better wants to go home.   Discharge Exam: Vitals:   08/15/19 1030 08/15/19 1125  BP: 93/64 105/62  Pulse: 88 86  Resp: 18 20  Temp:    SpO2: 99% 100%   Vitals:   08/15/19 0800 08/15/19 0910 08/15/19 1030 08/15/19 1125  BP: 93/65 97/62 93/64  105/62  Pulse: 92  88 86  Resp: 16  18 20   Temp: 98.3 F (36.8 C)     TempSrc: Oral     SpO2: 98%  99% 100%  Weight:      Height:       General: Pt appears jaundiced and chronically ill.  Cardiovascular: normal S1/S2 +, no rubs, no gallops Respiratory: CTA bilaterally, no wheezing, no rhonchi Abdominal:  Markedly distended, fluid waves, bowel sounds + Extremities: 2+ pedal edema, no cyanosis   The results of significant diagnostics from this hospitalization (including imaging, microbiology, ancillary and laboratory) are listed below for reference.     Microbiology: Recent Results (from the past 240 hour(s))  Culture, body fluid-bottle     Status:  None (Preliminary result)   Collection Time: 08/15/19 11:03 AM   Specimen: Ascitic  Result Value Ref Range Status   Specimen Description ASCITIC  Final   Special Requests   Final    BOTTLES DRAWN AEROBIC AND ANAEROBIC 10CC Performed at [facility], [address] [place], [place] [address]    Culture PENDING  Incomplete   Report Status PENDING  Incomplete  Gram stain     Status: None   Collection Time: 08/15/19 11:04 AM   Specimen: Ascitic; Body Fluid  Result Value Ref Range Status   Specimen Description ASCITIC  Final    Special Requests NONE  Final   Gram Stain   Final    WBC PRESENT, PREDOMINANTLY MONONUCLEAR NO ORGANISMS SEEN CYTOSPIN SMEAR Performed at [facility], [address] [place], [place] [address]    Report Status 08/15/2019 FINAL  Final     Labs: BNP (last 3 results) Recent Labs    06/18/19 1619  BNP 38.0   Basic Metabolic Panel: Recent Labs  Lab 08/14/19 1440 08/15/19 0628  NA 129* 131*  K 4.5 4.2  CL 97* 102  CO2 25 23  GLUCOSE 115* 96  BUN 15 13  CREATININE 1.15 0.97  CALCIUM 8.3* 8.3*   Liver Function Tests: Recent Labs  Lab 08/14/19 1440 08/15/19 0628  AST 41 31  ALT 20 17  ALKPHOS 99 92  BILITOT 3.5* 2.6*  PROT 6.6 5.8*  ALBUMIN 2.5* 2.5*   No results for input(s): LIPASE, AMYLASE in the last 168 hours. Recent Labs  Lab 08/14/19 1434  AMMONIA 35   CBC: Recent Labs  Lab 08/14/19 1440 08/15/19 0628  WBC 6.8 5.8  NEUTROABS 4.5  --   HGB 9.4* 8.2*  HCT 28.5* 24.5*  MCV 109.2* 107.9*  PLT 125* 121*   Cardiac Enzymes: No results for input(s): CKTOTAL, CKMB, CKMBINDEX, TROPONINI in the last 168 hours. BNP: Invalid input(s): POCBNP CBG: No results for input(s): GLUCAP in the last 168 hours. D-Dimer No results for input(s): DDIMER in the last 72 hours. Hgb A1c No results for input(s): HGBA1C in the last 72 hours. Lipid Profile No results for input(s): CHOL, HDL, LDLCALC, TRIG, CHOLHDL, LDLDIRECT in the last 72 hours. Thyroid function studies No results for input(s): TSH, T4TOTAL, T3FREE, THYROIDAB in the last 72 hours.  Invalid input(s): FREET3 Anemia work up No results for input(s): VITAMINB12, FOLATE, FERRITIN, TIBC, IRON, RETICCTPCT in the last 72 hours. Urinalysis    Component Value Date/Time   COLORURINE YELLOW 07/25/2019 1700   APPEARANCEUR CLEAR 07/25/2019 1700   LABSPEC 1.012 07/25/2019 1700   PHURINE 5.0 07/25/2019 1700   GLUCOSEU NEGATIVE 07/25/2019 1700   HGBUR MODERATE (A) 07/25/2019 1700   BILIRUBINUR NEGATIVE 07/25/2019  1700   KETONESUR NEGATIVE 07/25/2019 1700   PROTEINUR NEGATIVE 07/25/2019 1700   NITRITE NEGATIVE 07/25/2019 1700   LEUKOCYTESUR NEGATIVE 07/25/2019 1700   Sepsis Labs Invalid input(s): PROCALCITONIN,  WBC,  LACTICIDVEN Microbiology Recent Results (from the past 240 hour(s))  Culture, body fluid-bottle     Status: None (Preliminary result)   Collection Time: 08/15/19 11:03 AM   Specimen: Ascitic  Result Value Ref Range Status   Specimen Description ASCITIC  Final   Special Requests   Final    BOTTLES DRAWN AEROBIC AND ANAEROBIC 10CC Performed at [facility], [address] [place], [place] [address]    Culture PENDING  Incomplete   Report Status PENDING  Incomplete  Gram stain     Status: None  Collection Time: 08/15/19 11:04 AM   Specimen: Ascitic; Body Fluid  Result Value Ref Range Status   Specimen Description ASCITIC  Final   Special Requests NONE  Final   Gram Stain   Final    WBC PRESENT, PREDOMINANTLY MONONUCLEAR NO ORGANISMS SEEN CYTOSPIN SMEAR Performed at [facility], [address], [place], [place] [address]    Report Status 08/15/2019 FINAL  Final   Time coordinating discharge:   SIGNED:  Standley Dakins, MD  [facility] 08/15/2019, 1:31 PM How to contact the [facility] Attending or Consulting provider 7A - 7P or covering provider during after hours 7P -7A, for this patient?  1. Check the care team in [facility] and look for a) attending/consulting [facility] provider listed and b) the [facility] team listed 2. Log into [email or web address] and use [facility]'s universal password to access. If you do not have the password, please contact the hospital operator. 3. Locate the [facility] provider you are looking for under [facility] and page to a number that you can be directly reached. 4. If you still have difficulty reaching the provider, please page the [facility] (Director on Call) for the Hospitalists listed on amion for assistance.

## 2019-08-15 NOTE — Progress Notes (Signed)
Nsg Discharge Note  Admit Date:  08/14/2019 Discharge date: 08/15/2019   [name] to be D/C'd home per MD order.  AVS completed.  Copy for chart, and copy for patient signed, and dated. Patient and significant other able to verbalize understanding.  Discharge Medication: Allergies as of 08/15/2019      Reactions   Codeine Hives      Medication List    TAKE these medications   baclofen 10 MG tablet Commonly known as: LIORESAL Take 1 tablet (10 mg total) by mouth 3 (three) times daily.   furosemide 40 MG tablet Commonly known as: LASIX Take 40 mg by mouth daily.   Lactulose 20 GM/30ML Soln Take 30 mLs (20 g total) by mouth in the morning and at bedtime.   pantoprazole 40 MG tablet Commonly known as: Protonix Take 1 tablet (40 mg total) by mouth 2 (two) times daily.   rifaximin 550 MG Tabs tablet Commonly known as: XIFAXAN Take 1 tablet (550 mg total) by mouth 2 (two) times daily.   spironolactone 100 MG tablet Commonly known as: Aldactone Take 1 tablet (100 mg total) by mouth daily.   sulfamethoxazole-trimethoprim 800-160 MG tablet Commonly known as: Bactrim DS Take 1 tablet by mouth daily. To prevent infection in your abdominal fluid.       Discharge Assessment: Vitals:   08/15/19 1030 08/15/19 1125  BP: 93/64 105/62  Pulse: 88 86  Resp: 18 20  Temp:    SpO2: 99% 100%   Skin clean, dry and intact without evidence of skin break down, no evidence of skin tears noted. IV catheter discontinued intact. Site without signs and symptoms of complications - no redness or edema noted at insertion site, patient denies c/o pain - only slight tenderness at site.  Dressing with slight pressure applied.  D/c Instructions-Education: Discharge instructions given to patient/family with verbalized understanding. D/c education completed with patient/family including follow up instructions, medication list, d/c activities limitations if indicated, with other d/c instructions  as indicated by MD - patient able to verbalize understanding, all questions fully answered. Patient instructed to return to ED, call 911, or call MD for any changes in condition.  Patient escorted via WC, and D/C home via private auto.  Sharmon Leyden, RN 08/15/2019 2:44 PM

## 2019-08-15 NOTE — Procedures (Signed)
PreOperative Dx: Alcoholic cirrhosis, ascites Postoperative Dx: Alcoholic cirrhosis, ascites Procedure:   US guided paracentesis Radiologist:  Tyron Russell Anesthesia:  10 ml of1% lidocaine Specimen:  5.2 L of yellow ascitic fluid EBL:   < 1 ml Complications: None

## 2019-08-17 NOTE — Telephone Encounter (Signed)
Noted. Pt seen in office last week by [facility]

## 2019-08-18 ENCOUNTER — Other Ambulatory Visit (HOSPITAL_COMMUNITY): Payer: Self-pay

## 2019-08-18 ENCOUNTER — Encounter (HOSPITAL_COMMUNITY): Payer: Self-pay

## 2019-08-18 LAB — PATHOLOGIST SMEAR REVIEW

## 2019-08-19 ENCOUNTER — Ambulatory Visit (HOSPITAL_COMMUNITY): Admit: 2019-08-19 | Payer: Self-pay | Admitting: Internal Medicine

## 2019-08-19 ENCOUNTER — Encounter (HOSPITAL_COMMUNITY): Payer: Self-pay

## 2019-08-19 SURGERY — ESOPHAGOGASTRODUODENOSCOPY (EGD) WITH PROPOFOL
Anesthesia: Monitor Anesthesia Care

## 2019-08-19 NOTE — Telephone Encounter (Signed)
noted 

## 2019-08-20 LAB — CULTURE, BODY FLUID W GRAM STAIN -BOTTLE: Culture: NO GROWTH

## 2019-08-21 ENCOUNTER — Telehealth: Payer: Self-pay | Admitting: Internal Medicine

## 2019-08-21 NOTE — Telephone Encounter (Signed)
Patient wife called and said someone called her husbands phone   Please call back at [phone]

## 2019-08-21 NOTE — Telephone Encounter (Signed)
Spoke with pts spouse. I don't see that anyone from our office has called pt. Spouse is aware.

## 2019-08-26 ENCOUNTER — Encounter: Payer: Self-pay | Admitting: Physician Assistant

## 2019-08-26 ENCOUNTER — Other Ambulatory Visit: Payer: Self-pay

## 2019-08-26 ENCOUNTER — Ambulatory Visit: Payer: Self-pay | Admitting: Physician Assistant

## 2019-08-26 VITALS — BP 100/70 | HR 106 | Temp 98.1°F | Ht 70.75 in | Wt 179.8 lb

## 2019-08-26 DIAGNOSIS — K7460 Unspecified cirrhosis of liver: Secondary | ICD-10-CM

## 2019-08-26 NOTE — Progress Notes (Signed)
BP 100/70   Pulse (!) 106   Temp 98.1 F (36.7 C)   Ht 5' 10.75" (1.797 m)   Wt 179 lb 12 oz (81.5 kg)   SpO2 99%   BMI 25.25 kg/m    Subjective:    Patient ID: Alan Phillips, male    DOB: [birth year], 54 y.o.   MRN: [id number]  HPI: Alan Phillips is a 54 y.o. male presenting on 08/26/2019 for Follow-up   HPI   Pt had a negative covid 19 screening questionnaire.      Pt is a pleasant 54yoM with cirrhosis.   He is followed by GI for this condition.  He has appt at [facility] in mid-July with GI as well..    He has some energy.  He has been doing some work on his car and motorcycle.    Pt has not applied for medicaid.   Pt says he has health insurance.  Card is seen by myself and pt care coordinator and says it was effective 07/05/19.    Relevant past medical, surgical, family and social history reviewed and updated as indicated. Interim medical history since our last visit reviewed. Allergies and medications reviewed and updated.    Current Outpatient Medications:  .  baclofen (LIORESAL) 10 MG tablet, Take 10 mg by mouth 3 (three) times daily., Disp: , Rfl:  .  furosemide (LASIX) 40 MG tablet, Take 40 mg by mouth daily., Disp: , Rfl:  .  Lactulose 20 GM/30ML SOLN, Take 30 mLs (20 g total) by mouth in the morning and at bedtime., Disp: 1892 mL, Rfl: 1 .  spironolactone (ALDACTONE) 100 MG tablet, Take 1 tablet (100 mg total) by mouth daily., Disp: 30 tablet, Rfl: 5 .  sulfamethoxazole-trimethoprim (BACTRIM DS) 800-160 MG tablet, Take 1 tablet by mouth daily. To prevent infection in your abdominal fluid., Disp: 30 tablet, Rfl: 11 .  pantoprazole (PROTONIX) 40 MG tablet, Take 1 tablet (40 mg total) by mouth 2 (two) times daily. (Patient not taking: Reported on 08/26/2019), Disp: 60 tablet, Rfl: 1 .  rifaximin (XIFAXAN) 550 MG TABS tablet, Take 1 tablet (550 mg total) by mouth 2 (two) times daily. (Patient not taking: Reported on 08/26/2019), Disp: 60 tablet, Rfl: 5     Review of  Systems  Per HPI unless specifically indicated above     Objective:    BP 100/70   Pulse (!) 106   Temp 98.1 F (36.7 C)   Ht 5' 10.75" (1.797 m)   Wt 179 lb 12 oz (81.5 kg)   SpO2 99%   BMI 25.25 kg/m   Wt Readings from Last 3 Encounters:  08/26/19 179 lb 12 oz (81.5 kg)  08/15/19 185 lb 6.5 oz (84.1 kg)  08/14/19 183 lb 6.4 oz (83.2 kg)    Physical Exam Constitutional:      Appearance: He is ill-appearing.  HENT:     Head: Normocephalic and atraumatic.  Pulmonary:     Effort: No respiratory distress.  Neurological:     Mental Status: He is alert and oriented to person, place, and time.     Comments: Walks with a cane  Psychiatric:        Attention and Perception: Attention normal.        Speech: Speech normal.        Behavior: Behavior is cooperative.           Assessment & Plan:    Encounter Diagnosis  Name Primary?  . Cirrhosis of liver  with ascites, unspecified hepatic cirrhosis type [facility]) Yes      Discussed with pt that he is no longer eligible for treatment at [facility] since he has insurance.  Recommended he establish with new PCP; he can return to Dr Manuella Ghazi if he wants (he will need to see if they accept his insurance).    Discussed with pt that all of his meds were prescribed by GI so he should have his pharmacy contact the prescriber for refills  Pt is given some more samples of Ensure

## 2019-09-01 ENCOUNTER — Telehealth: Payer: Self-pay | Admitting: Internal Medicine

## 2019-09-01 NOTE — Telephone Encounter (Signed)
Pt is out of refills and out of his prescription of Baclofen. He uses [facility]. [phone]

## 2019-09-01 NOTE — Telephone Encounter (Signed)
Pt called back and said he needed a refill on his pantoprazole as well.

## 2019-09-01 NOTE — Telephone Encounter (Signed)
Routing to [facility] refill. Pt is requesting a refill for Pantoprazole 40 mg bid and Baclofen. Pt uses [facility]. Please advise if Baclofen is a med our office has prescribed for pt.

## 2019-09-02 MED ORDER — PANTOPRAZOLE SODIUM 40 MG PO TBEC: 40.0000 mg | DELAYED_RELEASE_TABLET | Freq: Two times a day (BID) | ORAL | 5 refills | Status: AC

## 2019-09-02 NOTE — Addendum Note (Signed)
Addended by: Gelene Mink on: 09/02/2019 01:28 PM   Modules accepted: Orders

## 2019-09-02 NOTE — Telephone Encounter (Addendum)
I'm not sure who started Baclofen. I am refilling Protonix. Please see if he knows when this was started? I have looked through notes and don't see this. We prescribe this at times with GERD patients.

## 2019-09-04 ENCOUNTER — Encounter (HOSPITAL_COMMUNITY): Payer: Self-pay

## 2019-09-04 ENCOUNTER — Ambulatory Visit (HOSPITAL_COMMUNITY)
Admission: RE | Admit: 2019-09-04 | Discharge: 2019-09-04 | Disposition: A | Discharge status: Home / Self Care | Payer: Self-pay | Source: Ambulatory Visit | Attending: Gastroenterology | Admitting: Gastroenterology

## 2019-09-04 ENCOUNTER — Other Ambulatory Visit: Payer: Self-pay

## 2019-09-04 DIAGNOSIS — R188 Other ascites: Secondary | ICD-10-CM | POA: Insufficient documentation

## 2019-09-04 LAB — BODY FLUID CELL COUNT WITH DIFFERENTIAL
Eos, Fluid: 0 %
Lymphs, Fluid: 79 %
Monocyte-Macrophage-Serous Fluid: 20 % — ABNORMAL LOW (ref 50–90)
Neutrophil Count, Fluid: 1 % (ref 0–25)
Total Nucleated Cell Count, Fluid: 55 cu mm (ref 0–1000)

## 2019-09-04 LAB — GRAM STAIN: Gram Stain: NONE SEEN

## 2019-09-04 MED ORDER — ALBUMIN HUMAN 25 % IV SOLN
INTRAVENOUS | Status: AC
  Administered 2019-09-04: 50 g via INTRAVENOUS
  Filled 2019-09-04: qty 200

## 2019-09-04 MED ORDER — ALBUMIN HUMAN 25 % IV SOLN: 50.0000 g | Freq: Once | INTRAVENOUS | Status: AC

## 2019-09-04 NOTE — Progress Notes (Signed)
Paracentesis complete no signs of distress.  

## 2019-09-04 NOTE — Procedures (Signed)
PreOperative Dx: Alcoholic cirrhosis, ascites Postoperative Dx: Alcoholic cirrhosis, ascites Procedure:   US guided paracentesis Radiologist:  Tyron Russell Anesthesia:  10 ml of1% lidocaine Specimen:             6.2 L of slightly cloudy yellow ascitic fluid EBL:   < 1 ml Complications: None

## 2019-09-05 LAB — PATHOLOGIST SMEAR REVIEW

## 2019-09-08 ENCOUNTER — Other Ambulatory Visit: Payer: Self-pay | Admitting: Gastroenterology

## 2019-09-10 LAB — CULTURE, BODY FLUID W GRAM STAIN -BOTTLE: Culture: NO GROWTH

## 2019-09-17 ENCOUNTER — Ambulatory Visit (HOSPITAL_COMMUNITY)
Admission: RE | Admit: 2019-09-17 | Discharge: 2019-09-17 | Disposition: A | Discharge status: Home / Self Care | Payer: Self-pay | Source: Ambulatory Visit | Attending: Gastroenterology | Admitting: Gastroenterology

## 2019-09-17 ENCOUNTER — Other Ambulatory Visit: Payer: Self-pay

## 2019-09-17 DIAGNOSIS — R188 Other ascites: Secondary | ICD-10-CM | POA: Insufficient documentation

## 2019-09-17 LAB — BODY FLUID CELL COUNT WITH DIFFERENTIAL
Eos, Fluid: 0 %
Lymphs, Fluid: 87 %
Monocyte-Macrophage-Serous Fluid: 10 % — ABNORMAL LOW (ref 50–90)
Neutrophil Count, Fluid: 3 % (ref 0–25)
Total Nucleated Cell Count, Fluid: 55 cu mm (ref 0–1000)

## 2019-09-17 LAB — GRAM STAIN: Gram Stain: NONE SEEN

## 2019-09-17 MED ORDER — ALBUMIN HUMAN 25 % IV SOLN: 50.0000 g | Freq: Once | INTRAVENOUS | Status: AC

## 2019-09-17 MED ORDER — ALBUMIN HUMAN 25 % IV SOLN
INTRAVENOUS | Status: AC
  Administered 2019-09-17: 50 g via INTRAVENOUS
  Filled 2019-09-17: qty 200

## 2019-09-17 NOTE — Progress Notes (Signed)
Paracentesis complete no signs of distress.  

## 2019-09-17 NOTE — Procedures (Signed)
   US guided LLQ paracentesis  8 Liters cloudy yellow fluid obtained Sent for labs per MD  Tolerated well

## 2019-09-18 LAB — PATHOLOGIST SMEAR REVIEW

## 2019-09-22 LAB — CULTURE, BODY FLUID W GRAM STAIN -BOTTLE: Culture: NO GROWTH

## 2019-09-29 ENCOUNTER — Encounter (HOSPITAL_COMMUNITY): Payer: Self-pay

## 2019-09-29 ENCOUNTER — Other Ambulatory Visit: Payer: Self-pay

## 2019-09-29 ENCOUNTER — Ambulatory Visit (HOSPITAL_COMMUNITY)
Admission: RE | Admit: 2019-09-29 | Discharge: 2019-09-29 | Disposition: A | Discharge status: Home / Self Care | Payer: Self-pay | Source: Ambulatory Visit | Attending: Gastroenterology | Admitting: Gastroenterology

## 2019-09-29 DIAGNOSIS — R188 Other ascites: Secondary | ICD-10-CM | POA: Insufficient documentation

## 2019-09-29 LAB — BODY FLUID CELL COUNT WITH DIFFERENTIAL
Eos, Fluid: 0 %
Lymphs, Fluid: 35 %
Monocyte-Macrophage-Serous Fluid: 63 % (ref 50–90)
Neutrophil Count, Fluid: 2 % (ref 0–25)
Total Nucleated Cell Count, Fluid: 44 cu mm (ref 0–1000)

## 2019-09-29 LAB — GRAM STAIN

## 2019-09-29 MED ORDER — ALBUMIN HUMAN 25 % IV SOLN: 50.0000 g | Freq: Once | INTRAVENOUS | Status: AC

## 2019-09-29 MED ORDER — ALBUMIN HUMAN 25 % IV SOLN
INTRAVENOUS | Status: AC
  Administered 2019-09-29: 50 g via INTRAVENOUS
  Filled 2019-09-29: qty 200

## 2019-09-30 NOTE — Progress Notes (Signed)
Referring Provider: Soyla Dryer, PA-C Primary Care Physician:  Patient, No Pcp Per Primary GI Physician: Dr. Gala Romney  Chief Complaint  Patient presents with  . Cirrhosis    HPI:   Alan Phillips is a 54 y.o. male presenting today for follow-up of decompensated alcoholic cirrhosis, MELD 24, Child Pugh B/C based on labs in July 2021 with [facility].   Diagnosed with alcoholic cirrhosis in January/February 2021 at the time of new onset of ascites/anasarca. We first saw patient in consult in March 2021. Reported he stopped drinking alcohol in February.  Found to have negative viral markers with no immunity to Hep A or B.  Mild elevation of ferritin at 541 and percent saturation at 43% suspected to be secondary to alcohol rather than hemochromatosis.  Plan to recheck this in a couple of months.  He has required frequent large-volume paracenteses as his ascites is not responding well to diuretic therapy. Notably, diuretic therapy limited due to hyponatremia and soft blood pressure. 1 episode of SBP in May 2021 with fluid analysis revealing low cell count and Gram stain negative but culture grew E. Coli. He was treated for SBP and started on Bactrim for SBP prophylaxis.   Prior upper GI bleed in April 2021 s/p EGD revealing grade 1 esophageal varices, medium sized hiatal hernia, Cameron erosions, and portal gastropathy with some bleeding which was controlled with application of 2 clips. Also with history of low volume hematochezia while inpatient in May 2021 for SBP treatment.  Recommended outpatient colonoscopy for CRC screening which has not yet been completed.  Last seen in our office 08/14/2019.  Meld 27 at that time.  Felt he needed another paracentesis.  Reported fluid reaccumulate in his abdomen within 24-48 hours after paracentesis.  Was trying to follow a low-sodium diet.  Admitted to mild hepatic encephalopathy that improved on lactulose and Xifaxan but he was trying to receive financial  assistance for Xifaxan. Denied melena.  No further rectal bleeding since hospitalization in May.  On exam, abdomen was distended, mildly tense, and 3+ pitting edema to his knees bilaterally.  Blood pressure was 95/61.  Admitted to lightheadedness, dizziness, seeing spots, and nausea.  Patient was sent to the emergency room for symptomatic low blood pressure.  He was to continue his current diuretic regimen of Lasix 40 mg and spironolactone 100 mg daily, continue Bactrim, continue lactulose, samples provided of Xifaxan, continue low-sodium diet, obtain hep A/B vaccine, follow-up in 2-4 weeks.  Patient has had 3 paras since last office visit (every 2 weeks).  Last para on 09/29/2019 with 8 L removed.  No SBP.  Patient was seen by [facility] hepatology clinic 09/24/2019 for initial consult.  He was started on zinc daily, advised to stop baclofen to limit sedating effects that may be contributing to confusion/encephalopathy, continue lactulose, continue to follow low-sodium diet, continue paracentesis locally as needed, 3 protein shakes daily, compression stockings and elevate legs in the evening.  He was still working on trying to get financial assistance for [employer].  Also advised to get HAV/HBV vaccination with local health department.  Regarding long-term evaluation of transplant, stated he would first need to obtain insurance in order to be considered for evaluation.  Patient was working on this.  Plans to update labs.  Plan to follow-up in 6 weeks.  Labs completed 09/24/2019 with [facility]: INR 1.4 (down from 1.8 on 6/11), AST, ALT, alk phos within normal limits, total bilirubin 3.1 (increased from 2.6 on 6/11), albumin 3.0 (improved from  2.5), creatinine 1.4 (increased from 0.97 on 6/11), sodium 129, hemoglobin 10.3 (increased from 8.2 on 6/11), platelets 153.  Meld 24 Child Pugh C (10)   Today:  Presents with his wife.   Weight 183 08/14/19 Weight 185 10/01/19  Taking Lactulose BID. Lactulose is causing  intermittent nausea with vomiting about 3 days a week. Thinks Xifaxan is coming in the mail on Friday. Loses his train of thought easily, but no confusion. Has been out of Xifaxan for about 1.5 weeks. Notices a big difference with Xifaxan. Was only taking Xifaxan once a day because they were trying to spread out the samples. Having 3-5 BMs daily with Lactulose. No brbpr or black stools.   States his urine has been dark.  Slacking off on fluids. Was told not to drink more than 5, 12 oz cups. Counting sodium in all foods and liquids. Limiting to no more than 2000 mg daily.   No abdominal pain. Abdomen is distended but about the same since para on Monday.   Hasn't had Lasix in about 1 month. Continues taking Aldactone.   GERD is well controlled. No dysphagia.   Some lightheadedness at times with Lactulose. No syncope. No CP or heart palpitations or SOB. Admits to easy bruising. Continues with swelling in his legs. States when he gets up in the morning, his legs are small, but swelling comes back quickly once he is up. Hasn't started wearing compression stockings.    Past Medical History:  Diagnosis Date  . Cirrhosis of liver ([place])   . Depression   . Esophageal varices ([facility])    grade 1 (EGD April 2021)  . Psoriasis   . Upper GI bleed    April 2021; secondary to careron lesions and portal gastropathy s/p placement of 2 clips    Past Surgical History:  Procedure Laterality Date  . BIOPSY  06/20/2019   Procedure: BIOPSY;  Surgeon: Danie Binder, MD;  Location: [facility];  Service: Endoscopy;;  gastric  . ESOPHAGEAL BANDING N/A 06/20/2019   Procedure: ESOPHAGEAL BANDING;  Surgeon: Danie Binder, MD;  Location: [facility];  Service: Endoscopy;  Laterality: N/A;  . ESOPHAGOGASTRODUODENOSCOPY (EGD) WITH PROPOFOL N/A 06/20/2019   Procedure: ESOPHAGOGASTRODUODENOSCOPY (EGD) WITH PROPOFOL;  Surgeon: Danie Binder, MD;  grade 1 esophageal varices, medium sized hiatal hernia, Cameron  erosions, and portal gastropathy with some bleeding which was controlled with application of 2 clips. Bx: Neg H. pylori  . none      Current Outpatient Medications  Medication Sig Dispense Refill  . baclofen (LIORESAL) 10 MG tablet TAKE 1 TABLET BY MOUTH THREE TIMES DAILY (Patient taking differently: as needed. ) 90 tablet 2  . Lactulose 20 GM/30ML SOLN Take 30 mLs (20 g total) by mouth in the morning and at bedtime. 1892 mL 1  . pantoprazole (PROTONIX) 40 MG tablet Take 1 tablet (40 mg total) by mouth 2 (two) times daily before a meal. 60 tablet 5  . spironolactone (ALDACTONE) 100 MG tablet Take 1 tablet (100 mg total) by mouth daily. 30 tablet 5  . sulfamethoxazole-trimethoprim (BACTRIM DS) 800-160 MG tablet Take 1 tablet by mouth daily. To prevent infection in your abdominal fluid. 30 tablet 11  . furosemide (LASIX) 20 MG tablet Take 1 tablet (20 mg total) by mouth daily. 30 tablet 3  . ondansetron (ZOFRAN) 4 MG tablet Take 1 tablet (4 mg total) by mouth every 8 (eight) hours as needed for nausea or vomiting. 30 tablet 1  .  rifaximin (XIFAXAN) 550 MG TABS tablet Take 1 tablet (550 mg total) by mouth 2 (two) times daily. (Patient not taking: Reported on 08/26/2019) 60 tablet 5   No current facility-administered medications for this visit.    Allergies as of 10/01/2019 - Review Complete 10/01/2019  Allergen Reaction Noted  . Codeine Hives 05/14/2019    Family History  Problem Relation Age of Onset  . Cirrhosis Paternal Uncle        etoh  . Cancer Paternal Uncle   . Alcohol abuse Paternal Uncle   . Alzheimer's disease Father   . Heart failure Maternal Uncle     Social History   Socioeconomic History  . Marital status: Married    Spouse name: Not on file  . Number of children: Not on file  . Years of education: Not on file  . Highest education level: Not on file  Occupational History  . Not on file  Tobacco Use  . Smoking status: Former Smoker    Packs/day: 0.50    Years:  42.00    Pack years: 21.00    Types: Cigarettes    Quit date: 12/05/2018    Years since quitting: 0.8  . Smokeless tobacco: Former Systems developer    Quit date: 05/14/1986  . Tobacco comment: quit last year  Vaping Use  . Vaping Use: Never used  Substance and Sexual Activity  . Alcohol use: Not Currently    Comment: h/o longtime etoh abuse but quit 04/2019 when he found out he has cirrhosis (05/14/19)  . Drug use: Yes    Types: Marijuana    Comment: few times a week   . Sexual activity: Not on file  Other Topics Concern  . Not on file  Social History Narrative  . Not on file   Social Determinants of Health   Financial Resource Strain:   . Difficulty of Paying Living Expenses:   Food Insecurity:   . Worried About Charity fundraiser in the Last Year:   . Arboriculturist in the Last Year:   Transportation Needs:   . Film/video editor (Medical):   Marland Kitchen Lack of Transportation (Non-Medical):   Physical Activity:   . Days of Exercise per Week:   . Minutes of Exercise per Session:   Stress:   . Feeling of Stress :   Social Connections:   . Frequency of Communication with Friends and Family:   . Frequency of Social Gatherings with Friends and Family:   . Attends Religious Services:   . Active Member of Clubs or Organizations:   . Attends Archivist Meetings:   Marland Kitchen Marital Status:     Review of Systems: Gen: Denies fever, chills, cold or flulike symptoms CV: See HPI Resp: See HPI GI: See HPI Derm: Denies rash  Heme: See HPI  Physical Exam: BP 90/68   Pulse 97   Temp (!) 97.2 F (36.2 C) (Oral)   Ht 5' 11"  (1.803 m)   Wt 185 lb 6.4 oz (84.1 kg)   BMI 25.86 kg/m  General:   Alert and oriented. No distress noted. Pleasant and cooperative. Frail, ill appearing.  Head:  Normocephalic and atraumatic. Eyes:  Conjuctiva clear without scleral icterus.Marland Kitchen Heart:  S1, S2 present without murmurs appreciated. Lungs:  Clear to auscultation bilaterally. No wheezes, rales, or  rhonchi. No distress.  Abdomen:  +BS. Abdomen is quite distended but not tense and is non-tender. + fluid wave. Reducible umbilical hernia. No rebound or guarding.  Msk:  Symmetrical without gross deformities. Normal posture. Extremities:  With 2+ bilateral LE pitting edema up to knees. 1+ pitting edema in his thighs.  Neurologic:  Alert and  oriented x4 Psych: Normal mood and affect.

## 2019-10-01 ENCOUNTER — Telehealth: Payer: Self-pay

## 2019-10-01 ENCOUNTER — Ambulatory Visit (INDEPENDENT_AMBULATORY_CARE_PROVIDER_SITE_OTHER): Payer: Self-pay | Admitting: Gastroenterology

## 2019-10-01 ENCOUNTER — Other Ambulatory Visit: Payer: Self-pay

## 2019-10-01 ENCOUNTER — Encounter: Payer: Self-pay | Admitting: Gastroenterology

## 2019-10-01 VITALS — BP 90/68 | HR 97 | Temp 97.2°F | Ht 71.0 in | Wt 185.4 lb

## 2019-10-01 DIAGNOSIS — K7031 Alcoholic cirrhosis of liver with ascites: Secondary | ICD-10-CM

## 2019-10-01 DIAGNOSIS — R112 Nausea with vomiting, unspecified: Secondary | ICD-10-CM

## 2019-10-01 DIAGNOSIS — K625 Hemorrhage of anus and rectum: Secondary | ICD-10-CM

## 2019-10-01 DIAGNOSIS — Z8719 Personal history of other diseases of the digestive system: Secondary | ICD-10-CM

## 2019-10-01 LAB — PATHOLOGIST SMEAR REVIEW

## 2019-10-01 MED ORDER — ONDANSETRON HCL 4 MG PO TABS: 4.0000 mg | ORAL_TABLET | Freq: Three times a day (TID) | ORAL | 1 refills | Status: DC | PRN

## 2019-10-01 NOTE — Telephone Encounter (Signed)
Noted. Hopefully zofran will help with his intermittent nausea/vomiting.

## 2019-10-01 NOTE — Telephone Encounter (Signed)
FYI Pt's spouse called back. Pt received his medication Xifaxan today (2 month supply). Pt will have labs done tomorrow. When pt left office today, he got sick.

## 2019-10-01 NOTE — Patient Instructions (Addendum)
Please have your labs completed today.  We will call with results and further recommendations.  I am sending in Zofran 4 mg for nausea.  You may take this every 8 hours as needed.  Continue taking lactulose twice daily for now.  Titrate this to have 3-4 bowel movements daily.  We are providing you with samples of Xifaxan.  Take 1 tablet twice daily.  Please call and let us know if he received Xifaxan in the mail on Friday.  Continue taking spironolactone 100 mg daily for now.  I will send in a new prescription for Lasix after we have lab results.  I suspect you are going to need another paracentesis very soon.  I will look into see if you are still set up with standing orders for parents.  Continue Bactrim daily.  Continue to follow a strict low-sodium diet.  No more than 2000 mg daily.  Continue drinking protein shakes with a goal of 3 protein shakes daily.  Continue to keep your feet elevated while you are sitting.  Please try compression stockings to help with swelling in her lower extremities.  Please keep a close check on your blood pressure.  If you develop any lightheadedness, dizziness, feeling like you will pass out, or if your blood pressure drops below 90/60, you should proceed to the emergency room.  We will plan to see you back in 4 weeks.  Do not hesitate to call with questions or concerns prior.  Ermalinda Memos, PA-C [facility]

## 2019-10-02 ENCOUNTER — Other Ambulatory Visit (HOSPITAL_COMMUNITY)
Admission: RE | Admit: 2019-10-02 | Discharge: 2019-10-02 | Disposition: A | Discharge status: Home / Self Care | Payer: Self-pay | Source: Ambulatory Visit | Attending: Gastroenterology | Admitting: Gastroenterology

## 2019-10-02 DIAGNOSIS — K7031 Alcoholic cirrhosis of liver with ascites: Secondary | ICD-10-CM | POA: Insufficient documentation

## 2019-10-02 LAB — IRON AND TIBC
Iron: 79 ug/dL (ref 45–182)
Saturation Ratios: 46 % — ABNORMAL HIGH (ref 17.9–39.5)
TIBC: 171 ug/dL — ABNORMAL LOW (ref 250–450)
UIBC: 92 ug/dL

## 2019-10-02 LAB — BASIC METABOLIC PANEL
Anion gap: 6 (ref 5–15)
BUN: 21 mg/dL — ABNORMAL HIGH (ref 6–20)
CO2: 20 mmol/L — ABNORMAL LOW (ref 22–32)
Calcium: 8.2 mg/dL — ABNORMAL LOW (ref 8.9–10.3)
Chloride: 97 mmol/L — ABNORMAL LOW (ref 98–111)
Creatinine, Ser: 1.11 mg/dL (ref 0.61–1.24)
GFR calc Af Amer: >60 mL/min (ref >60)
GFR calc non Af Amer: >60 mL/min (ref >60)
Glucose, Bld: 136 mg/dL — ABNORMAL HIGH (ref 70–99)
Potassium: 4.5 mmol/L (ref 3.5–5.1)
Sodium: 123 mmol/L — ABNORMAL LOW (ref 135–145)

## 2019-10-02 LAB — FERRITIN: Ferritin: 415 ng/mL — ABNORMAL HIGH (ref 24–336)

## 2019-10-02 NOTE — Telephone Encounter (Signed)
Noted  

## 2019-10-03 ENCOUNTER — Other Ambulatory Visit: Payer: Self-pay | Admitting: Gastroenterology

## 2019-10-03 DIAGNOSIS — K7031 Alcoholic cirrhosis of liver with ascites: Secondary | ICD-10-CM

## 2019-10-03 MED ORDER — FUROSEMIDE 20 MG PO TABS: 20.0000 mg | ORAL_TABLET | Freq: Every day | ORAL | 3 refills | Status: DC

## 2019-10-03 NOTE — Progress Notes (Signed)
Ferritin continues to be elevated at 415, saturation ratio elevated at 46.  Kidney function improved to 1.11 from 1.4 last week with [facility].  Sodium down a bit to 123.  Patient's sodium is chronically in the 120s.  Discussed results with patient's wife.  States patient has been feeling okay today.  She has plans to pick up Zofran from the pharmacy today for his intermittent nausea.  Checked his blood pressure yesterday and it was 99/69.  He still has not received hepatitis A/B vaccine but plans to have this completed within the next week.  Explained that we are in a very tough situation with little wiggle room regarding diuretic therapy due to hyponatremia and hypotension.   Recommendations: 1.  Start Lasix 20 mg daily and decrease spironolactone to 50 mg daily. 2.  Labs: AFP, hemochromatosis DNA, BMP (complete on Tuesday when he goes for paracentesis)- Helmut Muster please arrange. 3.  CT A/P with IV contrast ASAP to rule out occult malignancy- [facility] Clinical Pool please arrange. Dx: 4.  Cirrhosis, refractory ascites, rule out occult malignancy.  5.  Requested patient check his blood pressure every day and let us know if his blood pressure drops below 90/60.  Also advised to monitor for any worsening nausea or vomiting, headaches, or weakness.  [facility] Clinical Pool: In addition to CT, I see patient is scheduled for paracentesis 8/3. Please make sure labs including body fluid count, body fluid culture, and gram stain are completed. He should also have albumin per protocol.

## 2019-10-04 LAB — CULTURE, BODY FLUID W GRAM STAIN -BOTTLE: Culture: NO GROWTH

## 2019-10-05 ENCOUNTER — Encounter: Payer: Self-pay | Admitting: Gastroenterology

## 2019-10-05 DIAGNOSIS — Z8719 Personal history of other diseases of the digestive system: Secondary | ICD-10-CM | POA: Insufficient documentation

## 2019-10-05 NOTE — Assessment & Plan Note (Signed)
Patient reports intermittent nausea with vomiting without hematemesis secondary to lactulose. Unfortunately, lactulose is needed due to hepatic encephalopathy. I will send in Zofran 4 mg every 8 hours as needed for nausea and vomiting.

## 2019-10-05 NOTE — Assessment & Plan Note (Addendum)
Patient had reported chronic history of intermittent toilet tissue hematochezia in the setting of hemorrhoids during hospitalization in May 2021. He did not undergo colonoscopy inpatient as he was without any significant rectal bleeding. No further rectal bleeding since hospitalization. We have discussed colonoscopy, but this is on hold due to decompensated cirrhosis. I do not feel patient is appropriate for colonoscopy at this time. Patient also prefers to hold off on colonoscopy. Notably, he is currently on Bactrim DS for SBP prophylaxis. Most recent hemoglobin 10.3 on 7/21 which is up from 8.2 in June. Anemia secondary to upper GI bleed in April 2021 as discussed below.    He is going to need first ever colonoscopy at some point if his clinical status improves. Continue to monitor for bright red blood per rectum.

## 2019-10-05 NOTE — Assessment & Plan Note (Addendum)
54 year old male with decompensated alcoholic cirrhosis (abstinent since February 2021), meld 24, child Pugh C, now following with [facility] hepatology (initial consult 09/24/2019). His viral markers were negative but he doesn't have immunity to Hep A/B. Ferritin and percent saturation are also elevated and suspected to be secondary to alcohol rather than hemochromatosis.   Cirrhosis is complicated by significant ascites/anasarca that has been difficult to manage with oral diuretics due to soft blood pressures and hyponatremia. He is required frequent paracentesis. Upper GI bleed in April 2021. EGD with grade 1 esophageal varices, medium sized ventral hernia with Cameron erosions, portal gastropathy with some bleeding s/p application of 2 clips. SBP in May 2021, maintaining on Bactrim for SBP prophylaxis. Last para 09/29/2019 with 8 L removed, no SBP. Doppler ultrasound in May 2020 with patent portal vein. Also with history of mild hepatic encephalopathy that has improved with lactulose and Xifaxan. Patient is still waiting to receive prescription for Xifaxan and we have been providing samples. Unfortunately, lactulose is causing intermittent nausea with vomiting. Patient denies abdominal pain, BRBPR, melena, or hematemesis. Notably, hemoglobin improved to 10.3 on 09/24/2019. Abdomen continues to be quite distended today but is not tense. He has 2+ pitting edema up to his knees bilaterally and 1+ pitting edema in his thighs. He is currently taking spironolactone 100 mg but reports running out of Lasix 1 month ago. Blood pressure today 90/68.  Plan: 1. Plan to update BMP prior to adjusting diuretics. He would likely benefit from resuming Lasix but we are very limited due to soft blood pressures. Notably, creatinine was bumped to 1.4 on 7/21. 2. May need to consider TIPS. I am not sure he would be a good candidate due to hepatic encephalopathy; however, patient is in a very difficult place. 3. Update iron panel with  ferritin. 4. Continue lactulose twice daily. Titrate to 3-4 BMs daily. 5. Sent Zofran 4 mg daily every 8 hours as needed to pharmacy due to report of intermittent nausea with vomiting secondary to lactulose. 6. Provided samples of Xifaxan 550 mg today with instructions to take twice daily. Patient expects to receive Xifaxan in the mail on 7/30. 7. Continue with paracentesis as needed. Patient has standing orders for this. Advised that he hold his diuretic medications on the day of paracentesis. 8. Follow a strict low-sodium diet. No more than 2000 mg/day. 9. Continue Bactrim DS indefinitely. 10. Continue protein shakes daily. Goal of 3/day. 11. Try compression stockings to help with lower extremity edema. Elevate legs while sitting. 12. Monitor blood pressures daily. If blood pressure drops below 90/60 and persists or if he develops any significant lightheadedness, dizziness, or feeling that he will pass out, he was advised to proceed to the emergency room. 13. Complete hepatitis A/B vaccine. 14. Follow-up in 4 weeks.  15. Continue to follow with [facility] hepatology.

## 2019-10-05 NOTE — Assessment & Plan Note (Addendum)
History of upper GI bleed in April 2021 s/p EGD revealing grade 1 esophageal varices, medium size hiatal hernia, Cameron erosions, and portal gastropathy with some bleeding which was controlled with application of 2 clips. He has been maintained on Protonix 40 mg twice daily without overt GI bleeding. Most recent hemoglobin 09/24/2019 up to 10.3 from 8.2 and June. He will continue Protonix 40 mg twice daily. Plan to follow-up in 4 weeks for cirrhosis.

## 2019-10-06 ENCOUNTER — Other Ambulatory Visit (HOSPITAL_COMMUNITY): Payer: Self-pay

## 2019-10-06 ENCOUNTER — Other Ambulatory Visit: Payer: Self-pay | Admitting: *Deleted

## 2019-10-06 ENCOUNTER — Other Ambulatory Visit: Payer: Self-pay

## 2019-10-06 DIAGNOSIS — R188 Other ascites: Secondary | ICD-10-CM

## 2019-10-06 DIAGNOSIS — K7031 Alcoholic cirrhosis of liver with ascites: Secondary | ICD-10-CM

## 2019-10-07 ENCOUNTER — Ambulatory Visit (HOSPITAL_COMMUNITY)
Admission: RE | Admit: 2019-10-07 | Discharge: 2019-10-07 | Disposition: A | Discharge status: Home / Self Care | Payer: Self-pay | Source: Ambulatory Visit | Attending: Gastroenterology | Admitting: Gastroenterology

## 2019-10-07 ENCOUNTER — Other Ambulatory Visit (HOSPITAL_COMMUNITY)
Admission: RE | Admit: 2019-10-07 | Discharge: 2019-10-07 | Disposition: A | Discharge status: Home / Self Care | Payer: Self-pay | Source: Ambulatory Visit | Attending: Gastroenterology | Admitting: Gastroenterology

## 2019-10-07 ENCOUNTER — Other Ambulatory Visit: Payer: Self-pay

## 2019-10-07 DIAGNOSIS — R188 Other ascites: Secondary | ICD-10-CM | POA: Insufficient documentation

## 2019-10-07 DIAGNOSIS — K7031 Alcoholic cirrhosis of liver with ascites: Secondary | ICD-10-CM | POA: Insufficient documentation

## 2019-10-07 LAB — GRAM STAIN

## 2019-10-07 LAB — BODY FLUID CELL COUNT WITH DIFFERENTIAL
Eos, Fluid: 2 %
Lymphs, Fluid: 72 %
Monocyte-Macrophage-Serous Fluid: 25 % — ABNORMAL LOW (ref 50–90)
Neutrophil Count, Fluid: 1 % (ref 0–25)
Total Nucleated Cell Count, Fluid: 68 cu mm (ref 0–1000)

## 2019-10-07 LAB — BASIC METABOLIC PANEL
Anion gap: 11 (ref 5–15)
BUN: 33 mg/dL — ABNORMAL HIGH (ref 6–20)
CO2: 20 mmol/L — ABNORMAL LOW (ref 22–32)
Calcium: 8.7 mg/dL — ABNORMAL LOW (ref 8.9–10.3)
Chloride: 92 mmol/L — ABNORMAL LOW (ref 98–111)
Creatinine, Ser: 1.82 mg/dL — ABNORMAL HIGH (ref 0.61–1.24)
GFR calc Af Amer: 47 mL/min — ABNORMAL LOW (ref >60)
GFR calc non Af Amer: 41 mL/min — ABNORMAL LOW (ref >60)
Glucose, Bld: 109 mg/dL — ABNORMAL HIGH (ref 70–99)
Potassium: 4.5 mmol/L (ref 3.5–5.1)
Sodium: 123 mmol/L — ABNORMAL LOW (ref 135–145)

## 2019-10-07 MED ORDER — ALBUMIN HUMAN 25 % IV SOLN
50.0000 g | Freq: Once | INTRAVENOUS | Status: AC
  Administered 2019-10-07: 50 g via INTRAVENOUS

## 2019-10-07 NOTE — Progress Notes (Signed)
Paracentesis complete no signs of distress.  

## 2019-10-08 ENCOUNTER — Other Ambulatory Visit: Payer: Self-pay

## 2019-10-08 ENCOUNTER — Other Ambulatory Visit: Payer: Self-pay | Admitting: *Deleted

## 2019-10-08 DIAGNOSIS — K7031 Alcoholic cirrhosis of liver with ascites: Secondary | ICD-10-CM

## 2019-10-08 DIAGNOSIS — R188 Other ascites: Secondary | ICD-10-CM

## 2019-10-08 LAB — AFP TUMOR MARKER: AFP, Serum, Tumor Marker: 3.8 ng/mL (ref 0.0–8.3)

## 2019-10-08 LAB — PATHOLOGIST SMEAR REVIEW

## 2019-10-08 NOTE — Procedures (Signed)
PreOperative Dx: Cirrhosis, ascites Postoperative Dx: Cirrhosis, ascites Procedure:   US guided paracentesis Radiologist:  Tyron Russell Anesthesia:  10 ml of1% lidocaine Specimen:  8.2 L of sl cloudy yellow ascitic fluid EBL:   < 1 ml Complications: None

## 2019-10-08 NOTE — Progress Notes (Signed)
Spoke with patients wife. Patient had decreased spironolactone to 50 mg and added Lasix 20 mg. He did not take diuretics yesterday before para. I have advised that he stop Lasix and spironolactone for now and repeat BMP tomorrow morning before CT.   Helmut Muster, can you arrange this?

## 2019-10-08 NOTE — Progress Notes (Signed)
Please call patient and let him know his kidney function has worsened.  Creatinine bumped to 1.82, up from 1.11, 6 days ago.  He had paracentesis yesterday.  He had labs drawn after paracentesis.  Not sure if this is contributing to renal abnormalities.  Please see if he is taking Lasix 20 mg and spironolactone 50 mg. (I had advised he start Lasix 20 mg and decrease spironolactone to 50 mg on 7/30).  Please note, you may have to call his wife. Pending his answers to the above, I will make recommendations regarding diuretic medication adjustments.

## 2019-10-09 ENCOUNTER — Telehealth: Payer: Self-pay

## 2019-10-09 ENCOUNTER — Ambulatory Visit (HOSPITAL_COMMUNITY): Admission: RE | Admit: 2019-10-09 | Payer: Self-pay | Source: Ambulatory Visit

## 2019-10-09 NOTE — Telephone Encounter (Signed)
Spoke with patient's wife. Patient is sleeping but has been alert and oriented today. He has not vomited but had dry heaves this morning. He did not have blood work completed. I explained the importance of having his blood work completed no later than tomorrow so I can monitor his kidney function. He is not to take any fluid medications until I have results tomorrow. Advised if patient did not start to feel  better or if he worsened over  night that he must go to the emergency room.   We will discuss rescheduling the CT after I have lab results tomorrow.

## 2019-10-09 NOTE — Telephone Encounter (Signed)
Routing to Kristen Harper, PA °

## 2019-10-09 NOTE — Telephone Encounter (Signed)
Received a VM from pts spouse. Pt is suppose to have blood work and CT scan today. Spouse plans on calling to r/s the CT scan due to pt not feeling well. Spouse wants to r/s the CT for next week.   Spoke with pts spouse. Pt was up at 4-4:30 AM and felt that pt had to vomit. Pt had more of dry heaves than vomiting. Pt's BP last night was 92/66 and spouse plans to recheck pts BP when she gets off work at 1:00 PM. Spouse called to reschedule his CT and they recommended that he go to the ED. They didn't advise them to r/s. Pt feels he might've eaten something last night that irritated him and doesn't want to go to the ED today. Spouse would like to know if we will be able to order CT for next week since Radiology will not r/s pt. Please advise.

## 2019-10-10 ENCOUNTER — Other Ambulatory Visit (HOSPITAL_COMMUNITY)
Admission: RE | Admit: 2019-10-10 | Discharge: 2019-10-10 | Disposition: A | Discharge status: Home / Self Care | Payer: Self-pay | Source: Ambulatory Visit | Attending: Gastroenterology | Admitting: Gastroenterology

## 2019-10-10 DIAGNOSIS — K7031 Alcoholic cirrhosis of liver with ascites: Secondary | ICD-10-CM | POA: Insufficient documentation

## 2019-10-10 LAB — BASIC METABOLIC PANEL
Anion gap: 8 (ref 5–15)
BUN: 32 mg/dL — ABNORMAL HIGH (ref 6–20)
CO2: 21 mmol/L — ABNORMAL LOW (ref 22–32)
Calcium: 8.4 mg/dL — ABNORMAL LOW (ref 8.9–10.3)
Chloride: 99 mmol/L (ref 98–111)
Creatinine, Ser: 1.49 mg/dL — ABNORMAL HIGH (ref 0.61–1.24)
GFR calc Af Amer: >60 mL/min (ref >60)
GFR calc non Af Amer: 52 mL/min — ABNORMAL LOW (ref >60)
Glucose, Bld: 116 mg/dL — ABNORMAL HIGH (ref 70–99)
Potassium: 4 mmol/L (ref 3.5–5.1)
Sodium: 128 mmol/L — ABNORMAL LOW (ref 135–145)

## 2019-10-10 LAB — HEMOCHROMATOSIS DNA-PCR(C282Y,H63D)

## 2019-10-12 ENCOUNTER — Other Ambulatory Visit: Payer: Self-pay | Admitting: Gastroenterology

## 2019-10-13 ENCOUNTER — Other Ambulatory Visit: Payer: Self-pay | Admitting: *Deleted

## 2019-10-13 ENCOUNTER — Other Ambulatory Visit: Payer: Self-pay

## 2019-10-13 ENCOUNTER — Other Ambulatory Visit (HOSPITAL_COMMUNITY)
Admission: RE | Admit: 2019-10-13 | Discharge: 2019-10-13 | Disposition: A | Discharge status: Home / Self Care | Payer: Self-pay | Source: Ambulatory Visit | Attending: Gastroenterology | Admitting: Gastroenterology

## 2019-10-13 DIAGNOSIS — R188 Other ascites: Secondary | ICD-10-CM

## 2019-10-13 DIAGNOSIS — K7031 Alcoholic cirrhosis of liver with ascites: Secondary | ICD-10-CM

## 2019-10-13 DIAGNOSIS — K625 Hemorrhage of anus and rectum: Secondary | ICD-10-CM

## 2019-10-13 DIAGNOSIS — D539 Nutritional anemia, unspecified: Secondary | ICD-10-CM | POA: Insufficient documentation

## 2019-10-13 DIAGNOSIS — R112 Nausea with vomiting, unspecified: Secondary | ICD-10-CM | POA: Insufficient documentation

## 2019-10-13 DIAGNOSIS — Z8719 Personal history of other diseases of the digestive system: Secondary | ICD-10-CM

## 2019-10-13 LAB — BASIC METABOLIC PANEL
Anion gap: 9 (ref 5–15)
BUN: 36 mg/dL — ABNORMAL HIGH (ref 6–20)
CO2: 20 mmol/L — ABNORMAL LOW (ref 22–32)
Calcium: 8.3 mg/dL — ABNORMAL LOW (ref 8.9–10.3)
Chloride: 95 mmol/L — ABNORMAL LOW (ref 98–111)
Creatinine, Ser: 1.57 mg/dL — ABNORMAL HIGH (ref 0.61–1.24)
GFR calc Af Amer: 57 mL/min — ABNORMAL LOW (ref >60)
GFR calc non Af Amer: 49 mL/min — ABNORMAL LOW (ref >60)
Glucose, Bld: 153 mg/dL — ABNORMAL HIGH (ref 70–99)
Potassium: 4 mmol/L (ref 3.5–5.1)
Sodium: 124 mmol/L — ABNORMAL LOW (ref 135–145)

## 2019-10-13 NOTE — Progress Notes (Signed)
Kidney function slightly worse since Friday (3 days ago). Patient has not been on any diuretics since 8/3. Swelling in abdomen and LE is about the same. States he has gained a couple pounds. Would like to have repeat para Thursday or Friday this week.   [facility] Clinical Pool:  1. Can you arrange for patient to have para Thursday or Friday. He has standing orders but said they were told we needed to arrange it. Albumin per protocol, max 6 L, body fluid count, culture, and gram stain 2. I would like to have his BMP repeated on Friday.  3. Please refer to nephrology ASAP for cirrhosis with worsening creatinine, need help with managing peripheral edema.  4. Please arrange CT A/P with reduced contrast. Cirrhosis, refractory ascites, rule out occult malignancy.

## 2019-10-14 ENCOUNTER — Other Ambulatory Visit: Payer: Self-pay

## 2019-10-14 ENCOUNTER — Other Ambulatory Visit: Payer: Self-pay | Admitting: *Deleted

## 2019-10-14 DIAGNOSIS — K7031 Alcoholic cirrhosis of liver with ascites: Secondary | ICD-10-CM

## 2019-10-14 LAB — CULTURE, BODY FLUID W GRAM STAIN -BOTTLE: Culture: NO GROWTH

## 2019-10-16 ENCOUNTER — Ambulatory Visit (HOSPITAL_COMMUNITY)
Admission: RE | Admit: 2019-10-16 | Discharge: 2019-10-16 | Disposition: A | Discharge status: Home / Self Care | Payer: Self-pay | Source: Ambulatory Visit | Attending: Gastroenterology | Admitting: Gastroenterology

## 2019-10-16 ENCOUNTER — Encounter (HOSPITAL_COMMUNITY): Payer: Self-pay | Admitting: Radiology

## 2019-10-16 ENCOUNTER — Other Ambulatory Visit: Payer: Self-pay

## 2019-10-16 DIAGNOSIS — R188 Other ascites: Secondary | ICD-10-CM | POA: Insufficient documentation

## 2019-10-16 DIAGNOSIS — K7031 Alcoholic cirrhosis of liver with ascites: Secondary | ICD-10-CM

## 2019-10-16 LAB — GRAM STAIN: Gram Stain: NONE SEEN

## 2019-10-16 LAB — BODY FLUID CELL COUNT WITH DIFFERENTIAL
Eos, Fluid: 0 %
Lymphs, Fluid: 80 %
Monocyte-Macrophage-Serous Fluid: 16 % — ABNORMAL LOW (ref 50–90)
Neutrophil Count, Fluid: 4 % (ref 0–25)
Other Cells, Fluid: 1 %
Total Nucleated Cell Count, Fluid: 52 cu mm (ref 0–1000)

## 2019-10-16 MED ORDER — ALBUMIN HUMAN 25 % IV SOLN
50.0000 g | Freq: Once | INTRAVENOUS | Status: AC
  Administered 2019-10-16: 50 g via INTRAVENOUS

## 2019-10-16 MED ORDER — IOHEXOL 300 MG/ML  SOLN
100.0000 mL | Freq: Once | INTRAMUSCULAR | Status: AC | PRN
  Administered 2019-10-16: 100 mL via INTRAVENOUS

## 2019-10-16 MED ORDER — ALBUMIN HUMAN 25 % IV SOLN
INTRAVENOUS | Status: AC
  Filled 2019-10-16: qty 200

## 2019-10-16 NOTE — Procedures (Signed)
PreOperative Dx: Cirrhosis, ascites Postoperative Dx: Cirrhosis, ascites Procedure:   US guided paracentesis Radiologist:  Jmarion Christiano Anesthesia:  10 ml of1% lidocaine Specimen:  6.2 L of cloudy yellow ascitic fluid EBL:   < 1 ml Complications: None  

## 2019-10-16 NOTE — Progress Notes (Signed)
Paracentesis complete no signs of distress.  

## 2019-10-17 LAB — PATHOLOGIST SMEAR REVIEW

## 2019-10-20 ENCOUNTER — Other Ambulatory Visit (HOSPITAL_COMMUNITY)
Admission: RE | Admit: 2019-10-20 | Discharge: 2019-10-20 | Disposition: A | Discharge status: Home / Self Care | Payer: Self-pay | Source: Ambulatory Visit | Attending: Gastroenterology | Admitting: Gastroenterology

## 2019-10-20 ENCOUNTER — Other Ambulatory Visit: Payer: Self-pay

## 2019-10-20 DIAGNOSIS — K7460 Unspecified cirrhosis of liver: Secondary | ICD-10-CM | POA: Insufficient documentation

## 2019-10-20 LAB — COMPREHENSIVE METABOLIC PANEL
ALT: 23 U/L (ref 0–44)
AST: 39 U/L (ref 15–41)
Albumin: 3.2 g/dL — ABNORMAL LOW (ref 3.5–5.0)
Alkaline Phosphatase: 78 U/L (ref 38–126)
Anion gap: 9 (ref 5–15)
BUN: 35 mg/dL — ABNORMAL HIGH (ref 6–20)
CO2: 23 mmol/L (ref 22–32)
Calcium: 8.2 mg/dL — ABNORMAL LOW (ref 8.9–10.3)
Chloride: 94 mmol/L — ABNORMAL LOW (ref 98–111)
Creatinine, Ser: 1.54 mg/dL — ABNORMAL HIGH (ref 0.61–1.24)
GFR calc Af Amer: 58 mL/min — ABNORMAL LOW (ref >60)
GFR calc non Af Amer: 50 mL/min — ABNORMAL LOW (ref >60)
Glucose, Bld: 135 mg/dL — ABNORMAL HIGH (ref 70–99)
Potassium: 3.7 mmol/L (ref 3.5–5.1)
Sodium: 126 mmol/L — ABNORMAL LOW (ref 135–145)
Total Bilirubin: 1.8 mg/dL — ABNORMAL HIGH (ref 0.3–1.2)
Total Protein: 6.1 g/dL — ABNORMAL LOW (ref 6.5–8.1)

## 2019-10-20 LAB — PROTIME-INR
INR: 1.4 — ABNORMAL HIGH (ref 0.8–1.2)
Prothrombin Time: 17 seconds — ABNORMAL HIGH (ref 11.4–15.2)

## 2019-10-21 ENCOUNTER — Other Ambulatory Visit: Payer: Self-pay | Admitting: *Deleted

## 2019-10-21 DIAGNOSIS — K7031 Alcoholic cirrhosis of liver with ascites: Secondary | ICD-10-CM

## 2019-10-22 ENCOUNTER — Telehealth: Payer: Self-pay

## 2019-10-22 LAB — CULTURE, BODY FLUID W GRAM STAIN -BOTTLE: Culture: NO GROWTH

## 2019-10-22 NOTE — Telephone Encounter (Signed)
Pts spouse called. Pt is scheduled for a para on this Friday. She would like to see if pt could have more than 6 liters drawn off. Spouse says pt use to get 8 liters drawn off and we brought the liters down to 6. Please advise.

## 2019-10-22 NOTE — Telephone Encounter (Signed)
We limited the quantity due to his kidney function. I am worried about him having such a large amount drawn off at one time. I would recommend we leave it at 6L for now. If he feels he needs more fluid drawn off before a full week is up, that is ok. He can arrange to have paras on Monday and Fridays for a couple of weeks to see if this will help.

## 2019-10-22 NOTE — Telephone Encounter (Signed)
Spoke with pts spouse. She is aware of Kristen Harpers recommendations and pts spouse will call back if needed.

## 2019-10-23 ENCOUNTER — Other Ambulatory Visit: Payer: Self-pay

## 2019-10-23 ENCOUNTER — Ambulatory Visit
Admission: RE | Admit: 2019-10-23 | Discharge: 2019-10-23 | Disposition: A | Discharge status: Home / Self Care | Payer: Self-pay | Source: Ambulatory Visit | Attending: Gastroenterology | Admitting: Gastroenterology

## 2019-10-23 DIAGNOSIS — K7031 Alcoholic cirrhosis of liver with ascites: Secondary | ICD-10-CM

## 2019-10-23 NOTE — Consult Note (Signed)
Chief Complaint:  Cirrhosis, recurrent refractory ascites, esophageal varices  Referring Physician(s): Harper,Kristen S  History of Present Illness: Alan Phillips is a 54 y.o. 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  He is now requiring paracentesis limited to 6 L twice a week.  Request for TIPS evaluation.  He is followed at [facility] by Dr. Kendell Bane and his PA Ermalinda Memos for his cirrhosis.  Overall he is at his baseline.  Telephone health only visit today because of Covid pandemic.  He seems to be mentating clearly and responding appropriately to questions.  No significant encephalopathy appreciated.  Telehealth visit today was also accompanied by his spouse.  He has had a recent CT 10/16/2019 demonstrating marked hepatic cirrhosis with a small liver.  Significant abdominal pelvic ascites with distention.  Portal vein is patent.  No biliary obstruction or dilatation.  No focal hepatic abnormality.  Esophageal varices noted.  No gastric renal shunt or gastric varices.  CT anatomy does appear favorable for TIPS creation.  However, most recent laboratory data reveals INR 1.4, sodium 126, creatinine 1.54, total bilirubin 1.8.  Calculated sodium corrected meld score 25.  At this meld score, TIPS would not be indicated because of the estimated 17-month mortality of 20%.  Transplant center evaluation is indicated at a meld score 25.  Past Medical History:  Diagnosis Date  . Cirrhosis of liver (HCC)   . Depression   . Esophageal varices (HCC)    grade 1 (EGD April 2021)  . Psoriasis   . Upper GI bleed    April 2021; secondary to careron lesions and portal gastropathy s/p placement of 2 clips    Past Surgical History:  Procedure Laterality Date  . BIOPSY  06/20/2019   Procedure: BIOPSY;  Surgeon: West Bali, MD;  Location: [facility];  Service: Endoscopy;;  gastric  . ESOPHAGEAL BANDING N/A 06/20/2019    Procedure: ESOPHAGEAL BANDING;  Surgeon: West Bali, MD;  Location: [facility];  Service: Endoscopy;  Laterality: N/A;  . ESOPHAGOGASTRODUODENOSCOPY (EGD) WITH PROPOFOL N/A 06/20/2019   Procedure: ESOPHAGOGASTRODUODENOSCOPY (EGD) WITH PROPOFOL;  Surgeon: West Bali, MD;  grade 1 esophageal varices, medium sized hiatal hernia, Cameron erosions, and portal gastropathy with some bleeding which was controlled with application of 2 clips. Bx: Neg H. pylori  . none      Allergies: Codeine  Medications: Prior to Admission medications   Medication Sig Start Date End Date Taking? Authorizing Provider  baclofen (LIORESAL) 10 MG tablet TAKE 1 TABLET BY MOUTH THREE TIMES DAILY Patient taking differently: as needed.  09/09/19   Tiffany Kocher, PA-C  CONSTULOSE 10 GM/15ML solution take 30 mls BY MOUTH in THE morning AND AT BEDTIME 10/16/19   Anice Paganini, NP  furosemide (LASIX) 20 MG tablet Take 1 tablet (20 mg total) by mouth daily. 10/03/19 10/02/20  Letta Median, PA-C  ondansetron (ZOFRAN) 4 MG tablet Take 1 tablet (4 mg total) by mouth every 8 (eight) hours as needed for nausea or vomiting. 10/01/19   Letta Median, PA-C  pantoprazole (PROTONIX) 40 MG tablet Take 1 tablet (40 mg total) by mouth 2 (two) times daily before a meal. 09/02/19 09/01/20  Gelene Mink, NP  rifaximin (XIFAXAN) 550 MG TABS tablet Take 1 tablet (550 mg total) by mouth 2 (two) times daily. Patient not taking: Reported on 08/26/2019 08/07/19   Tiffany Kocher, PA-C  spironolactone (  ALDACTONE) 100 MG tablet Take 1 tablet (100 mg total) by mouth daily. 07/23/19   Tiffany Kocher, PA-C  sulfamethoxazole-trimethoprim (BACTRIM DS) 800-160 MG tablet Take 1 tablet by mouth daily. To prevent infection in your abdominal fluid. 08/06/19   Tiffany Kocher, PA-C     Family History  Problem Relation Age of Onset  . Cirrhosis Paternal Uncle        etoh  . Cancer Paternal Uncle   . Alcohol abuse Paternal Uncle   . Alzheimer's  disease Father   . Heart failure Maternal Uncle     Social History   Socioeconomic History  . Marital status: Married    Spouse name: Not on file  . Number of children: Not on file  . Years of education: Not on file  . Highest education level: Not on file  Occupational History  . Not on file  Tobacco Use  . Smoking status: Former Smoker    Packs/day: 0.50    Years: 42.00    Pack years: 21.00    Types: Cigarettes    Quit date: 12/05/2018    Years since quitting: 0.8  . Smokeless tobacco: Former Neurosurgeon    Quit date: 05/14/1986  . Tobacco comment: quit last year  Vaping Use  . Vaping Use: Never used  Substance and Sexual Activity  . Alcohol use: Not Currently    Comment: h/o longtime etoh abuse but quit 04/2019 when he found out he has cirrhosis (05/14/19)  . Drug use: Yes    Types: Marijuana    Comment: few times a week   . Sexual activity: Not on file  Other Topics Concern  . Not on file  Social History Narrative  . Not on file   Social Determinants of Health   Financial Resource Strain:   . Difficulty of Paying Living Expenses: Not on file  Food Insecurity:   . Worried About Programme researcher, broadcasting/film/video in the Last Year: Not on file  . Ran Out of Food in the Last Year: Not on file  Transportation Needs:   . Lack of Transportation (Medical): Not on file  . Lack of Transportation (Non-Medical): Not on file  Physical Activity:   . Days of Exercise per Week: Not on file  . Minutes of Exercise per Session: Not on file  Stress:   . Feeling of Stress : Not on file  Social Connections:   . Frequency of Communication with Friends and Family: Not on file  . Frequency of Social Gatherings with Friends and Family: Not on file  . Attends Religious Services: Not on file  . Active Member of Clubs or Organizations: Not on file  . Attends Banker Meetings: Not on file  . Marital Status: Not on file    Review of Systems  Review of Systems: A 12 point ROS discussed and  pertinent positives are indicated in the HPI above.  All other systems are negative.  Physical Exam No direct physical exam was performed, telephone health visit only today. Vital Signs: There were no vitals taken for this visit.  Imaging: CT ABDOMEN PELVIS W WO CONTRAST  Result Date: 10/16/2019 CLINICAL DATA:  Ascites, cirrhosis, rule out occult malignancy EXAM: CT ABDOMEN AND PELVIS WITHOUT AND WITH CONTRAST TECHNIQUE: Multidetector CT imaging of the abdomen and pelvis was performed following the standard protocol before and following the bolus administration of intravenous contrast. CONTRAST:  OMNIPAQUE IOHEXOL 300 MG/ML  SOLN COMPARISON:  None. FINDINGS: Lower chest: No  acute abnormality.  Moderate hiatal hernia. Hepatobiliary: Shrunken, cirrhotic morphology of the liver. No focal lesion. No gallstones, gallbladder wall thickening, or biliary dilatation. Pancreas: Unremarkable. No pancreatic ductal dilatation or surrounding inflammatory changes. Spleen: Normal in size without significant abnormality. Adrenals/Urinary Tract: Adrenal glands are unremarkable. Kidneys are normal, without renal calculi, solid lesion, or hydronephrosis. Bladder is unremarkable. Stomach/Bowel: Stomach is within normal limits. Appendix appears normal. No evidence of bowel wall thickening, distention, or inflammatory changes. Descending and sigmoid diverticulosis Vascular/Lymphatic: Aortic atherosclerosis. The portal vein is patent. No enlarged abdominal or pelvic lymph nodes. Reproductive: No mass or other significant abnormality. Other: No abdominal wall hernia or abnormality. Large volume ascites throughout the abdomen and pelvis. Musculoskeletal: No acute or significant osseous findings. IMPRESSION: 1. Shrunken, cirrhotic morphology of the liver. No focal lesion. 2. Large volume ascites throughout the abdomen and pelvis. 3. Moderate hiatal hernia. 4. Descending and sigmoid diverticulosis without evidence of  diverticulitis. 5. Aortic Atherosclerosis (ICD10-I70.0). Electronically Signed   By: Lauralyn Primes M.D.   On: 10/16/2019 13:10   US Paracentesis  Result Date: 10/16/2019 INDICATION: Cirrhosis, ascites EXAM: ULTRASOUND GUIDED DIAGNOSTIC AND THERAPEUTIC PARACENTESIS MEDICATIONS: None COMPLICATIONS: None immediate PROCEDURE: Informed written consent was obtained from the patient after a discussion of the risks, benefits and alternatives to treatment. A timeout was performed prior to the initiation of the procedure. Initial ultrasound scanning demonstrates a large amount of ascites within the right lower abdominal quadrant. The right lower abdomen was prepped and draped in the usual sterile fashion. 1% lidocaine was used for local anesthesia. Following this, a 5 [place] Yueh catheter was introduced. An ultrasound image was saved for documentation purposes. The paracentesis was performed. The catheter was removed and a dressing was applied. The patient tolerated the procedure well without immediate post procedural complication. Patient received post-procedure intravenous albumin; see nursing notes for details. FINDINGS: A total of approximately 6.2 L of cloudy yellow ascitic fluid was removed. Samples were sent to the laboratory as requested by the clinical team. IMPRESSION: Successful ultrasound-guided paracentesis yielding 6.2 liters of peritoneal fluid. Electronically Signed   By: Ulyses Southward M.D.   On: 10/16/2019 15:09   US Paracentesis  Result Date: 10/08/2019 INDICATION: Cirrhosis, ascites EXAM: ULTRASOUND GUIDED DIAGNOSTIC AND THERAPEUTIC PARACENTESIS MEDICATIONS: None COMPLICATIONS: None immediate PROCEDURE: Informed written consent was obtained from the patient after a discussion of the risks, benefits and alternatives to treatment. A timeout was performed prior to the initiation of the procedure. Initial ultrasound scanning demonstrates a large amount of ascites within the right lower abdominal quadrant.  The right lower abdomen was prepped and draped in the usual sterile fashion. 1% lidocaine was used for local anesthesia. Following this, a 5 [place] Yueh catheter was introduced. An ultrasound image was saved for documentation purposes. The paracentesis was performed. The catheter was removed and a dressing was applied. The patient tolerated the procedure well without immediate post procedural complication. Patient received post-procedure intravenous albumin; see nursing notes for details. FINDINGS: A total of approximately 8.2 L of slightly cloudy yellow ascitic fluid was removed. Samples were sent to the laboratory as requested by the clinical team. IMPRESSION: Successful ultrasound-guided paracentesis yielding 8.2 liters of peritoneal fluid. Electronically Signed   By: Ulyses Southward M.D.   On: 10/08/2019 08:28   US Paracentesis  Result Date: 09/29/2019 INDICATION: Ascites. EXAM: ULTRASOUND GUIDED therapeutic and diagnostic PARACENTESIS MEDICATIONS: None. COMPLICATIONS: None immediate. PROCEDURE: Informed written consent was obtained from the patient after a discussion of the risks, benefits and  alternatives to treatment. A timeout was performed prior to the initiation of the procedure. Initial ultrasound scanning demonstrates a large amount of ascites within the right lower abdominal quadrant. The right lower abdomen was prepped and draped in the usual sterile fashion. 1% lidocaine was used for local anesthesia. Following this, a paracentesis catheter was introduced. An ultrasound image was saved for documentation purposes. The paracentesis was performed. The catheter was removed and a dressing was applied. The patient tolerated the procedure well without immediate post procedural complication. FINDINGS: A total of approximately 8 L of serous fluid was removed. Samples were sent to the laboratory as requested by the clinical team. IMPRESSION: Successful ultrasound-guided paracentesis yielding 8 liters of  peritoneal fluid. Electronically Signed   By: Lupita Raider M.D.   On: 09/29/2019 15:06    Labs:  CBC: Recent Labs    07/27/19 0550 07/28/19 0553 08/14/19 1440 08/15/19 0628  WBC 7.4 7.7 6.8 5.8  HGB 7.9* 7.6* 9.4* 8.2*  HCT 23.4* 23.0* 28.5* 24.5*  PLT 234 216 125* 121*    COAGS: Recent Labs    06/18/19 1619 06/19/19 0815 07/27/19 0550 08/14/19 1440 08/15/19 0628 10/20/19 1533  INR 1.7*   < > 1.7* 1.7* 1.8* 1.4*  APTT 45*  --   --   --  46*  --    < > = values in this interval not displayed.    BMP: Recent Labs    10/07/19 1430 10/10/19 1440 10/13/19 1512 10/20/19 1533  NA 123* 128* 124* 126*  K 4.5 4.0 4.0 3.7  CL 92* 99 95* 94*  CO2 20* 21* 20* 23  GLUCOSE 109* 116* 153* 135*  BUN 33* 32* 36* 35*  CALCIUM 8.7* 8.4* 8.3* 8.2*  CREATININE 1.82* 1.49* 1.57* 1.54*  GFRNONAA 41* 52* 49* 50*  GFRAA 47* >60 57* 58*    LIVER FUNCTION TESTS: Recent Labs    07/25/19 1608 07/25/19 1608 07/27/19 0550 08/14/19 1440 08/15/19 0628 10/20/19 1533  BILITOT 6.6*   < > 4.9* 3.5* 2.6* 1.8*  AST 48*  --   --  41 31 39  ALT 23  --   --  20 17 23   ALKPHOS 83  --   --  99 92 78  PROT 6.7  --   --  6.6 5.8* 6.1*  ALBUMIN 2.0*  --   --  2.5* 2.5* 3.2*   < > = values in this interval not displayed.    TUMOR MARKERS: No results for input(s): AFPTM, CEA, CA199, CHROMGRNA in the last 8760 hours.  Assessment and Plan:  Advanced alcoholic cirrhotic with refractory abdominal pelvic ascites and esophageal varices.  He is now requiring biweekly paracenteses limited to 6 L at a time despite diuretic therapy.  Calculated sodium corrected meld score 25.  Plan: Meld score of 25 is too high for TIPS intervention.  Recommend tertiary referral for transplant evaluation.  After discussion he states that he is being evaluated at [facility] in mid September.  He was encouraged to keep this appointment as transplant would be his best option at survival.  Thank you for this interesting  consult.  I greatly enjoyed meeting Angelica Wix and look forward to participating in their care.  A copy of this report was sent to the requesting provider on this date.  Electronically Signed: [name] 10/23/2019, 3:19 PM   I spent a total of  60 Minutes   in remote  clinical consultation, greater than 50% of which was  counseling/coordinating care for this patient with advanced cirrhosis and refractory ascites.    Visit type: Audio only (telephone). Audio (no video) only due to patient's lack of internet/smartphone capability. Alternative for in-person consultation at [facility], [address], [place], [place]. This visit type was conducted due to national recommendations for restrictions regarding the COVID-19 Pandemic (e.g. social distancing).  This format is felt to be most appropriate for this patient at this time.  All issues noted in this document were discussed and addressed.

## 2019-10-24 ENCOUNTER — Other Ambulatory Visit: Payer: Self-pay

## 2019-10-24 ENCOUNTER — Ambulatory Visit (HOSPITAL_COMMUNITY)
Admission: RE | Admit: 2019-10-24 | Discharge: 2019-10-24 | Disposition: A | Discharge status: Home / Self Care | Payer: Self-pay | Source: Ambulatory Visit | Attending: Gastroenterology | Admitting: Gastroenterology

## 2019-10-24 ENCOUNTER — Encounter (HOSPITAL_COMMUNITY): Payer: Self-pay

## 2019-10-24 DIAGNOSIS — R188 Other ascites: Secondary | ICD-10-CM | POA: Insufficient documentation

## 2019-10-24 LAB — BODY FLUID CELL COUNT WITH DIFFERENTIAL
Eos, Fluid: 0 %
Lymphs, Fluid: 80 %
Monocyte-Macrophage-Serous Fluid: 19 % — ABNORMAL LOW (ref 50–90)
Neutrophil Count, Fluid: 1 % (ref 0–25)
Total Nucleated Cell Count, Fluid: 56 cu mm (ref 0–1000)

## 2019-10-24 LAB — GRAM STAIN

## 2019-10-24 MED ORDER — ALBUMIN HUMAN 25 % IV SOLN: 50.0000 g | Freq: Once | INTRAVENOUS | Status: AC

## 2019-10-24 MED ORDER — ALBUMIN HUMAN 25 % IV SOLN
INTRAVENOUS | Status: AC
  Administered 2019-10-24: 50 g via INTRAVENOUS
  Filled 2019-10-24: qty 200

## 2019-10-24 NOTE — Procedures (Signed)
PreOperative Dx: Alcoholic cirrhosis, ascites Postoperative Dx: Alcoholic cirrhosis, ascites Procedure:   US guided paracentesis Radiologist:  Tyron Russell Anesthesia:  10 ml of1% lidocaine Specimen:  6.2 L of cloudy yellow ascitic fluid EBL:   < 1 ml Complications: None

## 2019-10-24 NOTE — Progress Notes (Signed)
Paracentesis complete no signs of distress.  

## 2019-10-27 LAB — PATHOLOGIST SMEAR REVIEW

## 2019-10-29 LAB — CULTURE, BODY FLUID W GRAM STAIN -BOTTLE: Culture: NO GROWTH

## 2019-10-30 ENCOUNTER — Ambulatory Visit (HOSPITAL_COMMUNITY)
Admission: RE | Admit: 2019-10-30 | Discharge: 2019-10-30 | Disposition: A | Discharge status: Home / Self Care | Payer: Self-pay | Source: Ambulatory Visit | Attending: Gastroenterology | Admitting: Gastroenterology

## 2019-10-30 ENCOUNTER — Other Ambulatory Visit: Payer: Self-pay

## 2019-10-30 DIAGNOSIS — R188 Other ascites: Secondary | ICD-10-CM | POA: Insufficient documentation

## 2019-10-30 LAB — BODY FLUID CELL COUNT WITH DIFFERENTIAL
Eos, Fluid: 0 %
Lymphs, Fluid: 64 %
Monocyte-Macrophage-Serous Fluid: 35 % — ABNORMAL LOW (ref 50–90)
Neutrophil Count, Fluid: 1 % (ref 0–25)
Total Nucleated Cell Count, Fluid: 59 cu mm (ref 0–1000)

## 2019-10-30 LAB — GRAM STAIN: Gram Stain: NONE SEEN

## 2019-10-30 MED ORDER — ALBUMIN HUMAN 25 % IV SOLN
INTRAVENOUS | Status: AC
  Administered 2019-10-30: 50 g via INTRAVENOUS
  Filled 2019-10-30: qty 200

## 2019-10-30 MED ORDER — ALBUMIN HUMAN 25 % IV SOLN: 50.0000 g | Freq: Once | INTRAVENOUS | Status: AC

## 2019-10-30 NOTE — Progress Notes (Signed)
Paracentesis complete no signs of distress.  

## 2019-10-30 NOTE — Procedures (Signed)
   US guided RLQ paracentesis  6 liters (maximum) - milky yellow fluid Sent for labs per MD  Tolerated well

## 2019-10-31 ENCOUNTER — Other Ambulatory Visit: Payer: Self-pay | Admitting: Gastroenterology

## 2019-10-31 ENCOUNTER — Ambulatory Visit (HOSPITAL_COMMUNITY): Admission: RE | Admit: 2019-10-31 | Payer: Self-pay | Source: Ambulatory Visit

## 2019-10-31 DIAGNOSIS — R112 Nausea with vomiting, unspecified: Secondary | ICD-10-CM

## 2019-10-31 DIAGNOSIS — R188 Other ascites: Secondary | ICD-10-CM

## 2019-10-31 LAB — PATHOLOGIST SMEAR REVIEW

## 2019-11-03 ENCOUNTER — Telehealth: Payer: Self-pay | Admitting: Gastroenterology

## 2019-11-03 ENCOUNTER — Ambulatory Visit (HOSPITAL_COMMUNITY)
Admission: RE | Admit: 2019-11-03 | Discharge: 2019-11-03 | Disposition: A | Discharge status: Home / Self Care | Payer: Self-pay | Source: Ambulatory Visit | Attending: Gastroenterology | Admitting: Gastroenterology

## 2019-11-03 ENCOUNTER — Other Ambulatory Visit: Payer: Self-pay

## 2019-11-03 ENCOUNTER — Other Ambulatory Visit (HOSPITAL_COMMUNITY): Payer: Self-pay | Admitting: Gastroenterology

## 2019-11-03 ENCOUNTER — Encounter (HOSPITAL_COMMUNITY): Payer: Self-pay

## 2019-11-03 DIAGNOSIS — R188 Other ascites: Secondary | ICD-10-CM

## 2019-11-03 LAB — BODY FLUID CELL COUNT WITH DIFFERENTIAL
Eos, Fluid: 0 %
Lymphs, Fluid: 73 %
Monocyte-Macrophage-Serous Fluid: 25 % — ABNORMAL LOW (ref 50–90)
Neutrophil Count, Fluid: 2 % (ref 0–25)
Total Nucleated Cell Count, Fluid: 57 cu mm (ref 0–1000)

## 2019-11-03 LAB — GRAM STAIN: Gram Stain: NONE SEEN

## 2019-11-03 MED ORDER — ALBUMIN HUMAN 25 % IV SOLN: 50.0000 g | Freq: Once | INTRAVENOUS | Status: AC

## 2019-11-03 MED ORDER — ALBUMIN HUMAN 25 % IV SOLN
INTRAVENOUS | Status: AC
  Administered 2019-11-03: 50 g via INTRAVENOUS
  Filled 2019-11-03: qty 200

## 2019-11-03 NOTE — Telephone Encounter (Signed)
[facility] clinical pool: please extend standing orders for para for an additional 3 months (use recent orders on file)>

## 2019-11-03 NOTE — Addendum Note (Signed)
Addended by: Corrie Mckusick on: 11/03/2019 03:28 PM   Modules accepted: Orders

## 2019-11-03 NOTE — Progress Notes (Signed)
Ordering provider changed to [facility] PA.

## 2019-11-03 NOTE — Progress Notes (Signed)
Paracentesis complete no signs of distress.  

## 2019-11-03 NOTE — Procedures (Signed)
PreOperative Dx: Alcoholic cirrhosis, ascites Postoperative Dx: Alcoholic cirrhosis, ascites Procedure:   US guided paracentesis Radiologist:  Tyron Russell Anesthesia:  10 ml of1% lidocaine Specimen:  6. L of cloudy light yellow ascitic fluid EBL:   < 1 ml Complications: None

## 2019-11-03 NOTE — Progress Notes (Signed)
Alan Phillips, please make sure standing orders are under Kristen's name. We are just continuing from previously.

## 2019-11-03 NOTE — Telephone Encounter (Signed)
New standing para order faxed to Central Scheduling. New para orders entered.

## 2019-11-04 LAB — CULTURE, BODY FLUID W GRAM STAIN -BOTTLE: Culture: NO GROWTH

## 2019-11-05 ENCOUNTER — Ambulatory Visit (HOSPITAL_COMMUNITY): Payer: Self-pay

## 2019-11-05 ENCOUNTER — Other Ambulatory Visit (HOSPITAL_COMMUNITY): Payer: Self-pay

## 2019-11-07 ENCOUNTER — Ambulatory Visit (HOSPITAL_COMMUNITY): Payer: Self-pay

## 2019-11-08 LAB — CULTURE, BODY FLUID W GRAM STAIN -BOTTLE: Culture: NO GROWTH

## 2019-11-13 ENCOUNTER — Other Ambulatory Visit: Payer: Self-pay

## 2019-11-13 ENCOUNTER — Telehealth: Payer: Self-pay | Admitting: Internal Medicine

## 2019-11-13 ENCOUNTER — Telehealth: Payer: Self-pay

## 2019-11-13 ENCOUNTER — Other Ambulatory Visit: Payer: Self-pay | Admitting: Gastroenterology

## 2019-11-13 ENCOUNTER — Ambulatory Visit (HOSPITAL_COMMUNITY)
Admission: RE | Admit: 2019-11-13 | Discharge: 2019-11-13 | Disposition: A | Discharge status: Home / Self Care | Payer: Self-pay | Source: Ambulatory Visit | Attending: Gastroenterology | Admitting: Gastroenterology

## 2019-11-13 DIAGNOSIS — R188 Other ascites: Secondary | ICD-10-CM

## 2019-11-13 NOTE — Telephone Encounter (Signed)
error 

## 2019-11-13 NOTE — Telephone Encounter (Signed)
Pts spouse walked in office to find out which ED pt should go to have pts renal function evaluated. Pt went to an appointment yesterday at [facility] and at this time pt is holding off on paracentesis until further evaluation is completed. Pts spouse was advised to go to any ED to have pts renal function evaluated.

## 2019-11-14 ENCOUNTER — Inpatient Hospital Stay (HOSPITAL_COMMUNITY): Payer: Self-pay

## 2019-11-14 ENCOUNTER — Other Ambulatory Visit: Payer: Self-pay

## 2019-11-14 ENCOUNTER — Encounter (HOSPITAL_COMMUNITY): Payer: Self-pay

## 2019-11-14 ENCOUNTER — Inpatient Hospital Stay (HOSPITAL_COMMUNITY)
Admission: EM | Admit: 2019-11-14 | Discharge: 2019-11-18 | DRG: 682 | Disposition: A | Discharge status: Home / Self Care | Payer: Self-pay | Attending: Internal Medicine | Admitting: Internal Medicine

## 2019-11-14 DIAGNOSIS — Z8249 Family history of ischemic heart disease and other diseases of the circulatory system: Secondary | ICD-10-CM

## 2019-11-14 DIAGNOSIS — E871 Hypo-osmolality and hyponatremia: Secondary | ICD-10-CM | POA: Diagnosis present

## 2019-11-14 DIAGNOSIS — K7031 Alcoholic cirrhosis of liver with ascites: Secondary | ICD-10-CM | POA: Diagnosis present

## 2019-11-14 DIAGNOSIS — Z820 Family history of epilepsy and other diseases of the nervous system: Secondary | ICD-10-CM

## 2019-11-14 DIAGNOSIS — R188 Other ascites: Secondary | ICD-10-CM

## 2019-11-14 DIAGNOSIS — K767 Hepatorenal syndrome: Secondary | ICD-10-CM | POA: Diagnosis present

## 2019-11-14 DIAGNOSIS — M62838 Other muscle spasm: Secondary | ICD-10-CM | POA: Diagnosis present

## 2019-11-14 DIAGNOSIS — Z79899 Other long term (current) drug therapy: Secondary | ICD-10-CM

## 2019-11-14 DIAGNOSIS — E876 Hypokalemia: Secondary | ICD-10-CM | POA: Diagnosis present

## 2019-11-14 DIAGNOSIS — I8510 Secondary esophageal varices without bleeding: Secondary | ICD-10-CM | POA: Diagnosis present

## 2019-11-14 DIAGNOSIS — K429 Umbilical hernia without obstruction or gangrene: Secondary | ICD-10-CM | POA: Diagnosis present

## 2019-11-14 DIAGNOSIS — N1832 Chronic kidney disease, stage 3b: Secondary | ICD-10-CM | POA: Diagnosis present

## 2019-11-14 DIAGNOSIS — Z87891 Personal history of nicotine dependence: Secondary | ICD-10-CM

## 2019-11-14 DIAGNOSIS — R64 Cachexia: Secondary | ICD-10-CM | POA: Diagnosis present

## 2019-11-14 DIAGNOSIS — Z20822 Contact with and (suspected) exposure to covid-19: Secondary | ICD-10-CM | POA: Diagnosis present

## 2019-11-14 DIAGNOSIS — D500 Iron deficiency anemia secondary to blood loss (chronic): Secondary | ICD-10-CM | POA: Diagnosis present

## 2019-11-14 DIAGNOSIS — N179 Acute kidney failure, unspecified: Principal | ICD-10-CM | POA: Diagnosis present

## 2019-11-14 DIAGNOSIS — Z885 Allergy status to narcotic agent status: Secondary | ICD-10-CM

## 2019-11-14 DIAGNOSIS — R9431 Abnormal electrocardiogram [ECG] [EKG]: Secondary | ICD-10-CM | POA: Diagnosis present

## 2019-11-14 DIAGNOSIS — Z6826 Body mass index (BMI) 26.0-26.9, adult: Secondary | ICD-10-CM

## 2019-11-14 DIAGNOSIS — K7290 Hepatic failure, unspecified without coma: Secondary | ICD-10-CM | POA: Diagnosis present

## 2019-11-14 LAB — URINALYSIS, ROUTINE W REFLEX MICROSCOPIC
Bacteria, UA: NONE SEEN
Bilirubin Urine: NEGATIVE
Glucose, UA: NEGATIVE mg/dL
Ketones, ur: NEGATIVE mg/dL
Leukocytes,Ua: NEGATIVE
Nitrite: NEGATIVE
Protein, ur: NEGATIVE mg/dL
Specific Gravity, Urine: 1.015 (ref 1.005–1.030)
pH: 5 (ref 5.0–8.0)

## 2019-11-14 LAB — AMMONIA: Ammonia: 69 umol/L — ABNORMAL HIGH (ref 9–35)

## 2019-11-14 LAB — COMPREHENSIVE METABOLIC PANEL
ALT: 25 U/L (ref 0–44)
AST: 62 U/L — ABNORMAL HIGH (ref 15–41)
Albumin: 3.4 g/dL — ABNORMAL LOW (ref 3.5–5.0)
Alkaline Phosphatase: 84 U/L (ref 38–126)
Anion gap: 12 (ref 5–15)
BUN: 40 mg/dL — ABNORMAL HIGH (ref 6–20)
CO2: 21 mmol/L — ABNORMAL LOW (ref 22–32)
Calcium: 8.5 mg/dL — ABNORMAL LOW (ref 8.9–10.3)
Chloride: 90 mmol/L — ABNORMAL LOW (ref 98–111)
Creatinine, Ser: 2.24 mg/dL — ABNORMAL HIGH (ref 0.61–1.24)
GFR calc Af Amer: 37 mL/min — ABNORMAL LOW (ref >60)
GFR calc non Af Amer: 32 mL/min — ABNORMAL LOW (ref >60)
Glucose, Bld: 114 mg/dL — ABNORMAL HIGH (ref 70–99)
Potassium: 3.3 mmol/L — ABNORMAL LOW (ref 3.5–5.1)
Sodium: 123 mmol/L — ABNORMAL LOW (ref 135–145)
Total Bilirubin: 2.5 mg/dL — ABNORMAL HIGH (ref 0.3–1.2)
Total Protein: 6.5 g/dL (ref 6.5–8.1)

## 2019-11-14 LAB — CBC WITH DIFFERENTIAL/PLATELET
Abs Immature Granulocytes: 0.04 10*3/uL (ref 0.00–0.07)
Basophils Absolute: 0 10*3/uL (ref 0.0–0.1)
Basophils Relative: 0 %
Eosinophils Absolute: 0.1 10*3/uL (ref 0.0–0.5)
Eosinophils Relative: 2 %
HCT: 27.8 % — ABNORMAL LOW (ref 39.0–52.0)
Hemoglobin: 9.7 g/dL — ABNORMAL LOW (ref 13.0–17.0)
Immature Granulocytes: 1 %
Lymphocytes Relative: 14 %
Lymphs Abs: 1.1 10*3/uL (ref 0.7–4.0)
MCH: 34.3 pg — ABNORMAL HIGH (ref 26.0–34.0)
MCHC: 34.9 g/dL (ref 30.0–36.0)
MCV: 98.2 fL (ref 80.0–100.0)
Monocytes Absolute: 1.4 10*3/uL — ABNORMAL HIGH (ref 0.1–1.0)
Monocytes Relative: 18 %
Neutro Abs: 5.2 10*3/uL (ref 1.7–7.7)
Neutrophils Relative %: 65 %
Platelets: 162 10*3/uL (ref 150–400)
RBC: 2.83 MIL/uL — ABNORMAL LOW (ref 4.22–5.81)
RDW: 13.2 % (ref 11.5–15.5)
WBC: 7.9 10*3/uL (ref 4.0–10.5)
nRBC: 0 % (ref 0.0–0.2)

## 2019-11-14 LAB — SARS CORONAVIRUS 2 BY RT PCR (HOSPITAL ORDER, PERFORMED IN ~~LOC~~ HOSPITAL LAB): SARS Coronavirus 2: NEGATIVE

## 2019-11-14 LAB — OSMOLALITY, URINE: Osmolality, Ur: 430 mOsm/kg (ref 300–900)

## 2019-11-14 LAB — PROTIME-INR
INR: 1.3 — ABNORMAL HIGH (ref 0.8–1.2)
Prothrombin Time: 15.9 seconds — ABNORMAL HIGH (ref 11.4–15.2)

## 2019-11-14 LAB — SODIUM, URINE, RANDOM: Sodium, Ur: <10 mmol/L

## 2019-11-14 LAB — TSH: TSH: 4.874 u[IU]/mL — ABNORMAL HIGH (ref 0.350–4.500)

## 2019-11-14 LAB — CREATININE, URINE, RANDOM: Creatinine, Urine: 169.91 mg/dL

## 2019-11-14 LAB — OSMOLALITY: Osmolality: 274 mOsm/kg — ABNORMAL LOW (ref 275–295)

## 2019-11-14 MED ORDER — ONDANSETRON HCL 4 MG/2ML IJ SOLN
4.0000 mg | Freq: Four times a day (QID) | INTRAMUSCULAR | Status: DC | PRN
  Administered 2019-11-16 – 2019-11-17 (×5): 4 mg via INTRAVENOUS
  Filled 2019-11-14 (×5): qty 2

## 2019-11-14 MED ORDER — LACTULOSE 10 GM/15ML PO SOLN
30.0000 g | Freq: Two times a day (BID) | ORAL | Status: DC
  Administered 2019-11-15 – 2019-11-18 (×5): 30 g via ORAL
  Filled 2019-11-14 (×7): qty 60

## 2019-11-14 MED ORDER — HEPARIN SODIUM (PORCINE) 5000 UNIT/ML IJ SOLN
5000.0000 [IU] | Freq: Three times a day (TID) | INTRAMUSCULAR | Status: DC
  Administered 2019-11-14 – 2019-11-18 (×11): 5000 [IU] via SUBCUTANEOUS
  Filled 2019-11-14 (×11): qty 1

## 2019-11-14 MED ORDER — POTASSIUM CHLORIDE CRYS ER 20 MEQ PO TBCR
40.0000 meq | EXTENDED_RELEASE_TABLET | Freq: Once | ORAL | Status: AC
  Administered 2019-11-14: 40 meq via ORAL
  Filled 2019-11-14: qty 2

## 2019-11-14 MED ORDER — SULFAMETHOXAZOLE-TRIMETHOPRIM 800-160 MG PO TABS
1.0000 | ORAL_TABLET | Freq: Every day | ORAL | Status: DC
  Administered 2019-11-15 – 2019-11-17 (×3): 1 via ORAL
  Filled 2019-11-14 (×3): qty 1

## 2019-11-14 MED ORDER — ACETAMINOPHEN 650 MG RE SUPP: 650.0000 mg | Freq: Four times a day (QID) | RECTAL | Status: DC | PRN

## 2019-11-14 MED ORDER — SODIUM CHLORIDE 0.9 % IV SOLN
250.0000 mL | INTRAVENOUS | Status: DC | PRN
  Administered 2019-11-17: 250 mL via INTRAVENOUS

## 2019-11-14 MED ORDER — ALBUMIN HUMAN 25 % IV SOLN
25.0000 g | Freq: Once | INTRAVENOUS | Status: AC
  Administered 2019-11-14: 25 g via INTRAVENOUS
  Filled 2019-11-14: qty 100

## 2019-11-14 MED ORDER — BACLOFEN 10 MG PO TABS
10.0000 mg | ORAL_TABLET | ORAL | Status: DC | PRN
  Administered 2019-11-15 (×2): 10 mg via ORAL
  Filled 2019-11-14 (×2): qty 1

## 2019-11-14 MED ORDER — SODIUM CHLORIDE 0.9% FLUSH
3.0000 mL | Freq: Two times a day (BID) | INTRAVENOUS | Status: DC
  Administered 2019-11-14 – 2019-11-18 (×8): 3 mL via INTRAVENOUS

## 2019-11-14 MED ORDER — ACETAMINOPHEN 325 MG PO TABS: 650.0000 mg | ORAL_TABLET | Freq: Four times a day (QID) | ORAL | Status: DC | PRN

## 2019-11-14 MED ORDER — ONDANSETRON HCL 4 MG PO TABS
4.0000 mg | ORAL_TABLET | Freq: Four times a day (QID) | ORAL | Status: DC | PRN
  Administered 2019-11-15: 4 mg via ORAL
  Filled 2019-11-14 (×2): qty 1

## 2019-11-14 MED ORDER — RIFAXIMIN 550 MG PO TABS
550.0000 mg | ORAL_TABLET | Freq: Two times a day (BID) | ORAL | Status: DC
  Administered 2019-11-15 – 2019-11-18 (×7): 550 mg via ORAL
  Filled 2019-11-14 (×8): qty 1

## 2019-11-14 MED ORDER — PANTOPRAZOLE SODIUM 40 MG PO TBEC
40.0000 mg | DELAYED_RELEASE_TABLET | Freq: Two times a day (BID) | ORAL | Status: DC
  Administered 2019-11-14 – 2019-11-18 (×8): 40 mg via ORAL
  Filled 2019-11-14 (×8): qty 1

## 2019-11-14 MED ORDER — SODIUM CHLORIDE 0.9% FLUSH: 3.0000 mL | INTRAVENOUS | Status: DC | PRN

## 2019-11-14 NOTE — ED Notes (Signed)
Pt seen at [facility]  Hx of liver failure cirrhosis, Hep A and B  High MELD score   Due to paracentesis today   Sent here due to creatinine

## 2019-11-14 NOTE — ED Notes (Signed)
Pt was here yesterday for paracentesis  But it was not done due to low sodium  Sent home and ? Punted to [facility] MD called this am to send pt to  ED for sodium eval due to fear of pending acute kidney injury  Paracentesis is done at this facility and pt was seen here for same and it was not done due to low sodium

## 2019-11-14 NOTE — ED Notes (Signed)
Call to lab  Re: added labs 

## 2019-11-14 NOTE — ED Provider Notes (Signed)
[facility] EMERGENCY DEPARTMENT Provider Note   CSN: [id number] Arrival date & time: 11/14/19  1138     History Chief Complaint  Patient presents with  . Abnormal Lab    Alan Phillips is a 54 y.o. male.  Alan Phillips is a 54 y.o. male with hx of alcoholic cirrhosis, esophageal varices, ascites requiring frequent paracentesis, hepatic encephalopathy and SBP, who presents for evaluation of abnormal labs. He was seen earlier this week by his liver doctor at [facility], and was found to have low sodium, and AKI. He was told to come to the emergency department for further evaluation. He was previously taking lasix, but they instructed him to stop this because of these labs. He reports only some early satiety due to build up of ascitic fluid, but denies any pain. No chest pain, SOB. Denies fatigue, lightheadedness, dizziness. Has chronic lower extremity edema which he states is unchanged. No headaches, seizures or other neurologic symptoms. Often has nausea when he wakes up but feels at baseline now. Wife denies confusion or altered mental status. Patient was supposed to have a paracentesis performed yesterday, but was told because of his electrolyte derangements he could not have this done. Presented to ED today for further evaluation. Lab work from [facility] on Wednesday showed sodium of 124 and creatinine of 2.5, was previously 1.49 in August.         Past Medical History:  Diagnosis Date  . Cirrhosis of liver (HCC)   . Depression   . Esophageal varices (HCC)    grade 1 (EGD April 2021)  . Psoriasis   . Upper GI bleed    April 2021; secondary to careron lesions and portal gastropathy s/p placement of 2 clips    Patient Active Problem List   Diagnosis Date Noted  . H/O: upper GI bleed 10/05/2019  . Nausea with vomiting 10/01/2019  . Rectal bleeding 08/14/2019  . Low blood pressure 08/14/2019  . Ascites due to alcoholic cirrhosis (HCC) 08/14/2019  . SBP (spontaneous bacterial  peritonitis) (HCC) 07/25/2019  . Acute GI bleeding 06/19/2019  . Hematemesis 06/18/2019  . Hyponatremia   . Hypokalemia   . Macrocytic anemia   . Coagulopathy (HCC)   . Prolonged QT interval   . Hepatic cirrhosis (HCC) 05/14/2019  . Anasarca 05/14/2019    Past Surgical History:  Procedure Laterality Date  . BIOPSY  06/20/2019   Procedure: BIOPSY;  Surgeon: West Bali, MD;  [facility]: [facility];  Service: Endoscopy;;  gastric  . ESOPHAGEAL BANDING N/A 06/20/2019   Procedure: ESOPHAGEAL BANDING;  Surgeon: West Bali, MD;  Location: [facility];  Service: Endoscopy;  Laterality: N/A;  . ESOPHAGOGASTRODUODENOSCOPY (EGD) WITH PROPOFOL N/A 06/20/2019   Procedure: ESOPHAGOGASTRODUODENOSCOPY (EGD) WITH PROPOFOL;  Surgeon: West Bali, MD;  grade 1 esophageal varices, medium sized hiatal hernia, Cameron erosions, and portal gastropathy with some bleeding which was controlled with application of 2 clips. Bx: Neg H. pylori  . none         Family History  Problem Relation Age of Onset  . Cirrhosis Paternal Uncle        etoh  . Cancer Paternal Uncle   . Alcohol abuse Paternal Uncle   . Alzheimer's disease Father   . Heart failure Maternal Uncle     Social History   Tobacco Use  . Smoking status: Former Smoker    Packs/day: 0.50    Years: 42.00    Pack years: 21.00    Types: Cigarettes  Quit date: 12/05/2018    Years since quitting: 0.9  . Smokeless tobacco: Former NeurosurgeonUser    Quit date: 05/14/1986  . Tobacco comment: quit last year  Vaping Use  . Vaping Use: Never used  Substance Use Topics  . Alcohol use: Not Currently    Comment: h/o longtime etoh abuse but quit 04/2019 when he found out he has cirrhosis (05/14/19)  . Drug use: Yes    Types: Marijuana    Comment: few times a week     Home Medications Prior to Admission medications   Medication Sig Start Date End Date Taking? Authorizing Provider  baclofen (LIORESAL) 10 MG tablet TAKE 1 TABLET BY MOUTH THREE  TIMES DAILY Patient taking differently: as needed.  09/09/19   Tiffany KocherLewis, Leslie S, PA-C  CONSTULOSE 10 GM/15ML solution take 30 mls BY MOUTH in THE morning AND AT BEDTIME 10/16/19   Anice PaganiniGill, Eric A, NP  furosemide (LASIX) 20 MG tablet Take 1 tablet (20 mg total) by mouth daily. 10/03/19 10/02/20  Letta MedianHarper, Kristen S, PA-C  ondansetron (ZOFRAN) 4 MG tablet TAKE 1 TABLET BY MOUTH EVERY 8 HOURS AS NEEDED FOR NAUSEA AND VOMITING 11/01/19   Gelene MinkBoone, Anna W, NP  pantoprazole (PROTONIX) 40 MG tablet Take 1 tablet (40 mg total) by mouth 2 (two) times daily before a meal. 09/02/19 09/01/20  Gelene MinkBoone, Anna W, NP  rifaximin (XIFAXAN) 550 MG TABS tablet Take 1 tablet (550 mg total) by mouth 2 (two) times daily. Patient not taking: Reported on 08/26/2019 08/07/19   Tiffany KocherLewis, Leslie S, PA-C  spironolactone (ALDACTONE) 100 MG tablet Take 1 tablet (100 mg total) by mouth daily. 07/23/19   Tiffany KocherLewis, Leslie S, PA-C  sulfamethoxazole-trimethoprim (BACTRIM DS) 800-160 MG tablet Take 1 tablet by mouth daily. To prevent infection in your abdominal fluid. 08/06/19   Tiffany KocherLewis, Leslie S, PA-C    Allergies    Codeine  Review of Systems   Review of Systems  Constitutional: Negative for chills and fever.  HENT: Negative.   Respiratory: Negative for cough and shortness of breath.   Cardiovascular: Negative for chest pain.  Gastrointestinal: Positive for abdominal distention. Negative for abdominal pain, blood in stool, constipation, diarrhea, nausea and vomiting.  Genitourinary: Negative for dysuria.  Musculoskeletal: Negative for arthralgias and myalgias.  Skin: Negative for color change and rash.  Neurological: Negative for dizziness, seizures, syncope, weakness, light-headedness, numbness and headaches.  All other systems reviewed and are negative.   Physical Exam Updated Vital Signs BP 121/76 (BP Location: Right Arm)   Pulse (!) 108   Temp 98 F (36.7 C) (Oral)   Resp 18   Ht 5\' 11"  (1.803 m)   Wt 86.6 kg   SpO2 100%   BMI 26.64 kg/m    Physical Exam Vitals and nursing note reviewed.  Constitutional:      General: He is not in acute distress.    Appearance: He is well-developed. He is ill-appearing. He is not toxic-appearing or diaphoretic.     Comments: Chronically ill-appearing but in no acute distress, appears cachectic  HENT:     Head: Normocephalic and atraumatic.     Mouth/Throat:     Mouth: Mucous membranes are moist.     Pharynx: Oropharynx is clear.  Eyes:     General:        Right eye: No discharge.        Left eye: No discharge.  Cardiovascular:     Rate and Rhythm: Normal rate and regular rhythm.  Heart sounds: Normal heart sounds. No murmur heard.  No friction rub. No gallop.   Pulmonary:     Effort: Pulmonary effort is normal. No respiratory distress.     Breath sounds: Normal breath sounds. No wheezing or rales.     Comments: Respirations equal and unlabored, patient able to speak in full sentences, lungs clear to auscultation bilaterally with some decreased breath sounds in the bases Abdominal:     General: Bowel sounds are normal. There is distension.     Palpations: Abdomen is soft. There is no mass.     Tenderness: There is no abdominal tenderness. There is no guarding.     Hernia: A hernia is present.     Comments: Protuberant distended abdomen with tense fluid-filled umbilical hernia noted, fluid wave noted, abdomen without focal tenderness, guarding or peritoneal signs.  Musculoskeletal:        General: No deformity.     Cervical back: Neck supple.  Skin:    General: Skin is warm and dry.     Capillary Refill: Capillary refill takes less than 2 seconds.  Neurological:     Mental Status: He is alert.     Coordination: Coordination normal.     Comments: Speech is clear, able to follow commands CN III-XII intact Normal strength in upper and lower extremities bilaterally including dorsiflexion and plantar flexion, strong and equal grip strength Sensation normal to light and sharp  touch Moves extremities without ataxia, coordination intact      ED Results / Procedures / Treatments   Labs (all labs ordered are listed, but only abnormal results are displayed) Labs Reviewed  CBC WITH DIFFERENTIAL/PLATELET - Abnormal; Notable for the following components:      Result Value   RBC 2.83 (*)    Hemoglobin 9.7 (*)    HCT 27.8 (*)    MCH 34.3 (*)    Monocytes Absolute 1.4 (*)    All other components within normal limits  COMPREHENSIVE METABOLIC PANEL - Abnormal; Notable for the following components:   Sodium 123 (*)    Potassium 3.3 (*)    Chloride 90 (*)    CO2 21 (*)    Glucose, Bld 114 (*)    BUN 40 (*)    Creatinine, Ser 2.24 (*)    Calcium 8.5 (*)    Albumin 3.4 (*)    AST 62 (*)    Total Bilirubin 2.5 (*)    GFR calc non Af Amer 32 (*)    GFR calc Af Amer 37 (*)    All other components within normal limits  AMMONIA - Abnormal; Notable for the following components:   Ammonia 69 (*)    All other components within normal limits  PROTIME-INR - Abnormal; Notable for the following components:   Prothrombin Time 15.9 (*)    INR 1.3 (*)    All other components within normal limits  URINALYSIS, ROUTINE W REFLEX MICROSCOPIC - Abnormal; Notable for the following components:   Hgb urine dipstick MODERATE (*)    All other components within normal limits  TSH - Abnormal; Notable for the following components:   TSH 4.874 (*)    All other components within normal limits  SARS CORONAVIRUS 2 BY RT PCR (HOSPITAL ORDER, PERFORMED IN [facility])  SODIUM, URINE, RANDOM  CREATININE, URINE, RANDOM  OSMOLALITY  OSMOLALITY, URINE  MAGNESIUM  COMPREHENSIVE METABOLIC PANEL  PROTIME-INR  CBC    EKG None  Radiology [place] ASCITES (ABDOMEN LIMITED)  Result  Date: 11/13/2019 CLINICAL DATA:  Cirrhosis, ascites EXAM: LIMITED ABDOMEN ULTRASOUND FOR ASCITES TECHNIQUE: Limited ultrasound survey for ascites was performed in all four abdominal quadrants.  COMPARISON:  11/03/2019 FINDINGS: Significant ascites identified in the lower quadrants bilaterally. In discussing clinical history with patient, patient indicates he was contacted by the consultant at [facility] that he saw on 11/12/2019 concerning worsening renal function. Discussed with patient and with Tana Coast PA. Note in chart indicates patient requires admission for worsening renal function and temporary hold on performing paracentesis. Paracentesis not performed at this time. Discussed with patient. IMPRESSION: Significant ascites; paracentesis not performed at this time as above. Electronically Signed   By: Ulyses Southward M.D.   On: 11/13/2019 14:04    Procedures Procedures (including critical care time)  Medications Ordered in ED Medications  rifaximin (XIFAXAN) tablet 550 mg (has no administration in time range)  sulfamethoxazole-trimethoprim (BACTRIM DS) 800-160 MG per tablet 1 tablet (has no administration in time range)  lactulose (CHRONULAC) 10 GM/15ML solution 30 g (has no administration in time range)  pantoprazole (PROTONIX) EC tablet 40 mg (40 mg Oral Given 11/14/19 1634)  baclofen (LIORESAL) tablet 10 mg (has no administration in time range)  heparin injection 5,000 Units (has no administration in time range)  sodium chloride flush (NS) 0.9 % injection 3 mL (has no administration in time range)  sodium chloride flush (NS) 0.9 % injection 3 mL (has no administration in time range)  0.9 %  sodium chloride infusion (has no administration in time range)  acetaminophen (TYLENOL) tablet 650 mg (has no administration in time range)    Or  acetaminophen (TYLENOL) suppository 650 mg (has no administration in time range)  ondansetron (ZOFRAN) tablet 4 mg (has no administration in time range)    Or  ondansetron (ZOFRAN) injection 4 mg (has no administration in time range)  albumin human 25 % solution 25 g (25 g Intravenous New Bag/Given 11/14/19 1627)  potassium  chloride SA (KLOR-CON) CR tablet 40 mEq (40 mEq Oral Given 11/14/19 1634)    ED Course  I have reviewed the triage vital signs and the nursing notes.  Pertinent labs & imaging results that were available during my care of the patient were reviewed by me and considered in my medical decision making (see chart for details).    MDM Rules/Calculators/A&P                          62-year-old male with history of alcoholic liver cirrhosis, sent by his liver transplant doctor from [facility] due to worsening renal function and hyponatremia noted on lab work on Wednesday, his diuretics were stopped, and he was supposed to receive a paracentesis yesterday but this was not completed due to laboratory derangements.  On arrival patient is mildly tachycardic, but vitals otherwise unremarkable.  Patient states that he is not having any new acute symptoms with this and feels at his baseline he often has some nausea in the morning and feels a bit weak and fatigued chronically.  Has not had any seizures, headaches, confusion or other neurologic symptoms.  Still making urine normally.  Lab work here today confirms hyponatremia with sodium of 23, mild hypokalemia of 3.3.  Patient's creatinine slightly improved from labs on Wednesday today 2.25 but still certainly above patient's typical baseline.  Patient needs paracentesis, but this could certainly related to worsening renal function.  Feel he will require admission for management of his worsening renal  function and hyponatremia prior to receiving paracentesis.  Will discuss with hospitalist for admission.  Case discussed with Dr. Sherryll Burger with [facility] hospitalist who will see and admit the patient.  He discussed the case with nephrology who recommends holding off on paracentesis and treating with albumin and fluid restriction prior to draining fluid.  Final Clinical Impression(s) / ED Diagnoses Final diagnoses:  AKI (acute kidney injury) [facility])    Rx / DC Orders ED  Discharge Orders    None       Legrand Rams 11/14/19 Beverly Sessions, MD 11/15/19 1444

## 2019-11-14 NOTE — ED Triage Notes (Signed)
Pt presents to ED for abnormal kidney function sent by doctor at [facility]. Pt was told to come to ED on Wednesday to have renal function checked and possible paracentesis.

## 2019-11-14 NOTE — ED Notes (Signed)
Report to Tiffany, RN 

## 2019-11-14 NOTE — H&P (Signed)
History and Physical    Alan Phillips XIP:[id number] DOB: Dec 17, 1965 DOA: 11/14/2019  PCP: Patient, No Pcp Per   Patient coming from: Home  Chief Complaint: Abnormal labs  HPI: Alan Phillips is a 54 y.o. male with medical history significant for alcohol liver cirrhosis, esophageal varices, portal gastropathy, prior alcohol use, and recurrent ascites with need for paracentesis who presented to the ED after he was told to coming given his creatinine elevation on 9/8.  He had lab work performed at [facility] for placement on transplant list at which time the abnormalities of hyponatremia and creatinine elevation were noted.  Patient denies any significant abdominal pain, nausea, vomiting, diarrhea, poor oral intake, chest pain, shortness of breath, fevers, or chills.  No confusion or altered mentation is noted for fianc at bedside.  He has been taking his medications otherwise as usual.  He denies any current alcohol use or tobacco use.   ED Course: Patient noted to be slightly tachycardic, but otherwise stable vital signs and he is afebrile.  Hemoglobin is 9.7 and potassium is 3.3.  Sodium 123, BUN 40, and creatinine 2.24.  Ammonia level 69.  Albumin 3.4.  INR ordered and pending.  Covid testing pending.  Paracentesis has currently been held given AKI.  Discussed case with nephrology with plans for fluid restriction as well as administration of albumin with further follow-up in a.m.  Review of Systems: All others reviewed and otherwise negative.  Past Medical History:  Diagnosis Date  . Cirrhosis of liver (HCC)   . Depression   . Esophageal varices (HCC)    grade 1 (EGD April 2021)  . Psoriasis   . Upper GI bleed    April 2021; secondary to careron lesions and portal gastropathy s/p placement of 2 clips    Past Surgical History:  Procedure Laterality Date  . BIOPSY  06/20/2019   Procedure: BIOPSY;  Surgeon: West Bali, MD;  [facility];  Service: Endoscopy;;   gastric  . ESOPHAGEAL BANDING N/A 06/20/2019   Procedure: ESOPHAGEAL BANDING;  Surgeon: West Bali, MD;  [facility];  Service: Endoscopy;  Laterality: N/A;  . ESOPHAGOGASTRODUODENOSCOPY (EGD) WITH PROPOFOL N/A 06/20/2019   Procedure: ESOPHAGOGASTRODUODENOSCOPY (EGD) WITH PROPOFOL;  Surgeon: West Bali, MD;  grade 1 esophageal varices, medium sized hiatal hernia, Cameron erosions, and portal gastropathy with some bleeding which was controlled with application of 2 clips. Bx: Neg H. pylori  . none       reports that he quit smoking about 11 months ago. His smoking use included cigarettes. He has a 21.00 pack-year smoking history. He quit smokeless tobacco use about 33 years ago. He reports previous alcohol use. He reports current drug use. Drug: Marijuana.  Allergies  Allergen Reactions  . Codeine Hives    Family History  Problem Relation Age of Onset  . Cirrhosis Paternal Uncle        etoh  . Cancer Paternal Uncle   . Alcohol abuse Paternal Uncle   . Alzheimer's disease Father   . Heart failure Maternal Uncle     Prior to Admission medications   Medication Sig Start Date End Date Taking? Authorizing Provider  baclofen (LIORESAL) 10 MG tablet TAKE 1 TABLET BY MOUTH THREE TIMES DAILY Patient taking differently: as needed.  09/09/19   Tiffany Kocher, PA-C  CONSTULOSE 10 GM/15ML solution take 30 mls BY MOUTH in THE morning AND AT BEDTIME 10/16/19   Anice Paganini, NP  furosemide (LASIX) 20  MG tablet Take 1 tablet (20 mg total) by mouth daily. 10/03/19 10/02/20  Letta Median, PA-C  ondansetron (ZOFRAN) 4 MG tablet TAKE 1 TABLET BY MOUTH EVERY 8 HOURS AS NEEDED FOR NAUSEA AND VOMITING 11/01/19   Gelene Mink, NP  pantoprazole (PROTONIX) 40 MG tablet Take 1 tablet (40 mg total) by mouth 2 (two) times daily before a meal. 09/02/19 09/01/20  Gelene Mink, NP  rifaximin (XIFAXAN) 550 MG TABS tablet Take 1 tablet (550 mg total) by mouth 2 (two) times daily. Patient not  taking: Reported on 08/26/2019 08/07/19   Tiffany Kocher, PA-C  spironolactone (ALDACTONE) 100 MG tablet Take 1 tablet (100 mg total) by mouth daily. 07/23/19   Tiffany Kocher, PA-C  sulfamethoxazole-trimethoprim (BACTRIM DS) 800-160 MG tablet Take 1 tablet by mouth daily. To prevent infection in your abdominal fluid. 08/06/19   Tiffany Kocher, PA-C    Physical Exam: Vitals:   11/14/19 1239  BP: 121/76  Pulse: (!) 108  Resp: 18  Temp: 98 F (36.7 C)  TempSrc: Oral  SpO2: 100%  Weight: 86.6 kg  Height: 5\' 11"  (1.803 m)    Constitutional: NAD, calm, comfortable Vitals:   11/14/19 1239  BP: 121/76  Pulse: (!) 108  Resp: 18  Temp: 98 F (36.7 C)  TempSrc: Oral  SpO2: 100%  Weight: 86.6 kg  Height: 5\' 11"  (1.803 m)   Eyes: lids and conjunctivae normal ENMT: Mucous membranes are moist.  Neck: normal, supple Respiratory: clear to auscultation bilaterally. Normal respiratory effort. No accessory muscle use.  Cardiovascular: Regular rate and rhythm, no murmurs. No extremity edema. Abdomen: no tenderness, tense ascites present with umbilical hernia. Musculoskeletal:  No joint deformity upper and lower extremities.   Skin: no rashes, lesions, ulcers.  Psychiatric: Normal judgment and insight. Alert and oriented x 3. Normal mood.   Labs on Admission: I have personally reviewed following labs and imaging studies  CBC: Recent Labs  Lab 11/14/19 1303  WBC 7.9  NEUTROABS 5.2  HGB 9.7*  HCT 27.8*  MCV 98.2  PLT 162   Basic Metabolic Panel: Recent Labs  Lab 11/14/19 1303  NA 123*  K 3.3*  CL 90*  CO2 21*  GLUCOSE 114*  BUN 40*  CREATININE 2.24*  CALCIUM 8.5*   GFR: Estimated Creatinine Clearance: 39.7 mL/min (A) (by C-G formula based on SCr of 2.24 mg/dL (H)). Liver Function Tests: Recent Labs  Lab 11/14/19 1303  AST 62*  ALT 25  ALKPHOS 84  BILITOT 2.5*  PROT 6.5  ALBUMIN 3.4*   No results for input(s): LIPASE, AMYLASE in the last 168 hours. Recent Labs    Lab 11/14/19 1305  AMMONIA 69*   Coagulation Profile: Recent Labs  Lab 11/14/19 1303  INR 1.3*   Cardiac Enzymes: No results for input(s): CKTOTAL, CKMB, CKMBINDEX, TROPONINI in the last 168 hours. BNP (last 3 results) No results for input(s): PROBNP in the last 8760 hours. HbA1C: No results for input(s): HGBA1C in the last 72 hours. CBG: No results for input(s): GLUCAP in the last 168 hours. Lipid Profile: No results for input(s): CHOL, HDL, LDLCALC, TRIG, CHOLHDL, LDLDIRECT in the last 72 hours. Thyroid Function Tests: No results for input(s): TSH, T4TOTAL, FREET4, T3FREE, THYROIDAB in the last 72 hours. Anemia Panel: No results for input(s): VITAMINB12, FOLATE, FERRITIN, TIBC, IRON, RETICCTPCT in the last 72 hours. Urine analysis:    Component Value Date/Time   COLORURINE YELLOW 07/25/2019 1700   APPEARANCEUR CLEAR 07/25/2019 1700  LABSPEC 1.012 07/25/2019 1700   PHURINE 5.0 07/25/2019 1700   GLUCOSEU NEGATIVE 07/25/2019 1700   HGBUR MODERATE (A) 07/25/2019 1700   BILIRUBINUR NEGATIVE 07/25/2019 1700   KETONESUR NEGATIVE 07/25/2019 1700   PROTEINUR NEGATIVE 07/25/2019 1700   NITRITE NEGATIVE 07/25/2019 1700   LEUKOCYTESUR NEGATIVE 07/25/2019 1700    Radiological Exams on Admission: [place] ASCITES (ABDOMEN LIMITED)  Result Date: 11/13/2019 CLINICAL DATA:  Cirrhosis, ascites EXAM: LIMITED ABDOMEN ULTRASOUND FOR ASCITES TECHNIQUE: Limited ultrasound survey for ascites was performed in all four abdominal quadrants. COMPARISON:  11/03/2019 FINDINGS: Significant ascites identified in the lower quadrants bilaterally. In discussing clinical history with patient, patient indicates he was contacted by the consultant at [facility] that he saw on 11/12/2019 concerning worsening renal function. Discussed with patient and with Tana Coast PA. Note in chart indicates patient requires admission for worsening renal function and temporary hold on performing  paracentesis. Paracentesis not performed at this time. Discussed with patient. IMPRESSION: Significant ascites; paracentesis not performed at this time as above. Electronically Signed   By: Ulyses Southward M.D.   On: 11/13/2019 14:04    Assessment/Plan Active Problems:   AKI (acute kidney injury) (HCC)    AKI-likely hepatorenal syndrome -Baseline creatinine 1.4-1.5, currently 2.24 -Urine electrolytes -Renal U/S -25g albumin x 1 dose now -Avoid nephrotoxic agents -Strict intake and output -Recheck am labs -Appreciate Nephrology evaluation  Acute on chronic hyponatremia-asymptomatic -Plan for fluid restriction 1200 mL daily -Continue to monitor  Alcoholic liver cirrhosis with recurrent ascites -Hold paracentesis given AKI; last paracentesis 8/30 -Continue lactulose and rifaximin -Hold spironolactone given AKI -No sign of SBP currently -Monitor ammonia levels; no sign of hepatic encephalopathy noted -Working with [facility] to get on transplant list -Routinely sees gastroenterologists in [place], will consult for evaluation and recommendations  History of esophageal varices and portal gastric hypertension related to above -Continue Protonix twice daily  DVT prophylaxis: Heparin Code Status: Full Family Communication: Fianc at bedside Disposition Plan:Admit for AKI treatment Consults called:Nephrology; spoke with Dr. Signe Colt 9/10. GI Admission status: Inpatient, Tele   Hunter Bachar D Jackson Fetters DO [facility]  If 7PM-7AM, please contact night-coverage [email or web address]  11/14/2019, 2:39 PM

## 2019-11-15 LAB — CBC
HCT: 24.1 % — ABNORMAL LOW (ref 39.0–52.0)
Hemoglobin: 8.6 g/dL — ABNORMAL LOW (ref 13.0–17.0)
MCH: 34.8 pg — ABNORMAL HIGH (ref 26.0–34.0)
MCHC: 35.7 g/dL (ref 30.0–36.0)
MCV: 97.6 fL (ref 80.0–100.0)
Platelets: 142 10*3/uL — ABNORMAL LOW (ref 150–400)
RBC: 2.47 MIL/uL — ABNORMAL LOW (ref 4.22–5.81)
RDW: 13.2 % (ref 11.5–15.5)
WBC: 5.8 10*3/uL (ref 4.0–10.5)
nRBC: 0 % (ref 0.0–0.2)

## 2019-11-15 LAB — COMPREHENSIVE METABOLIC PANEL
ALT: 22 U/L (ref 0–44)
AST: 50 U/L — ABNORMAL HIGH (ref 15–41)
Albumin: 3.3 g/dL — ABNORMAL LOW (ref 3.5–5.0)
Alkaline Phosphatase: 68 U/L (ref 38–126)
Anion gap: 9 (ref 5–15)
BUN: 38 mg/dL — ABNORMAL HIGH (ref 6–20)
CO2: 26 mmol/L (ref 22–32)
Calcium: 8.7 mg/dL — ABNORMAL LOW (ref 8.9–10.3)
Chloride: 91 mmol/L — ABNORMAL LOW (ref 98–111)
Creatinine, Ser: 2.07 mg/dL — ABNORMAL HIGH (ref 0.61–1.24)
GFR calc Af Amer: 41 mL/min — ABNORMAL LOW (ref >60)
GFR calc non Af Amer: 35 mL/min — ABNORMAL LOW (ref >60)
Glucose, Bld: 129 mg/dL — ABNORMAL HIGH (ref 70–99)
Potassium: 3.6 mmol/L (ref 3.5–5.1)
Sodium: 126 mmol/L — ABNORMAL LOW (ref 135–145)
Total Bilirubin: 3 mg/dL — ABNORMAL HIGH (ref 0.3–1.2)
Total Protein: 5.9 g/dL — ABNORMAL LOW (ref 6.5–8.1)

## 2019-11-15 LAB — MAGNESIUM: Magnesium: 2.3 mg/dL (ref 1.7–2.4)

## 2019-11-15 LAB — PROTIME-INR
INR: 1.5 — ABNORMAL HIGH (ref 0.8–1.2)
Prothrombin Time: 17.2 seconds — ABNORMAL HIGH (ref 11.4–15.2)

## 2019-11-15 MED ORDER — ALBUMIN HUMAN 25 % IV SOLN
25.0000 g | Freq: Four times a day (QID) | INTRAVENOUS | Status: AC
  Administered 2019-11-15 – 2019-11-17 (×8): 25 g via INTRAVENOUS
  Filled 2019-11-15 (×8): qty 100

## 2019-11-15 NOTE — Progress Notes (Signed)
PROGRESS NOTE  Alan Phillips  DOB: 1966/03/05  PCP: Patient, No Pcp Per NLG:[id number]  DOA: 11/14/2019  LOS: 1 day   Chief Complaint  Patient presents with  . Abnormal Lab    Brief narrative: Alan Phillips is a 54 y.o. male with PMH of prior alcohol abuse, alcoholic liver cirrhosis, esophageal varices, portal gastropathy, recurrent ascites requiring paracentesis. Patient was sent to the ED on 11/14/2019 for elevated creatinine 9/8.  In the ED, patient was slightly tachycardic.  Lab work showed hemoglobin of 9.7, potassium 3.3, sodium 123, BUN/creatinine 40/2.24, ammonia level 69. He was given a dose of IV albumin. Patient was admitted to hospitalist service for further evaluation management.  Subjective: Patient was seen and examined this morning. Middle-aged Caucasian male. Thin built. Has huge ascites. Wife at bedside.  Assessment/Plan: AKI on CKD 3  -Likely due to hepatorenal syndrome.   -Given IV albumin 25 mg every 6 hours for 48 hours -Currently medically stable. 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  Patient is trying to get in their transplant list. -He usually gets low volume paracentesis. -Radiology not available over the weekend.  Plan for paracentesis on Monday, less than 4 L. -May require IV albumin for procedure. -Continue lactulose and rifaximin.  Aldactone on hold. -Continue to monitor blood pressure. -Continue Bactrim prophylaxis for SBP  Acute on chronic hyponatremia -Likely hypervolemic hyponatremia asymptomatic -Plan for fluid restriction 1200 mL daily -Continue to monitor Recent Labs  Lab 11/14/19 1303  11/15/19 0835  NA 123* 126*   History of esophageal varices and portal gastric hypertension related to above -Continue Protonix twice daily  Mobility: Encourage ambulation Code Status:   Code Status: Full Code  Nutritional status: Body mass index is 26.64 kg/m.     Diet Order            Diet Heart Room service appropriate? Yes; Fluid consistency: Thin; Fluid restriction: 1200 mL Fluid  Diet effective now                 DVT prophylaxis: heparin injection 5,000 Units Start: 11/14/19 1600   Antimicrobials:  Bactrim Fluid: None Consultants: Nephrology Family Communication:  Wife at bedside  Status is: Inpatient  Remains inpatient appropriate because needs paracentesis.  Dispo: The patient is from: Home              Anticipated d/c is to: Home              Anticipated d/c date is: 3 days              Patient currently is not medically stable to d/c.       Infusions:  . sodium chloride      Scheduled Meds: . heparin  5,000 Units Subcutaneous Q8H  . lactulose  30 g Oral BID  . pantoprazole  40 mg Oral BID AC  . rifaximin  550 mg Oral BID  . sodium chloride flush  3 mL Intravenous Q12H  . sulfamethoxazole-trimethoprim  1 tablet Oral Daily    Antimicrobials: Anti-infectives (From admission, onward)   Start     Dose/Rate Route Frequency Ordered Stop   11/14/19 1600  rifaximin (XIFAXAN) tablet 550 mg        550 mg Oral 2 times daily 11/14/19 1524     11/14/19 1600  sulfamethoxazole-trimethoprim (BACTRIM DS) 800-160 MG per tablet 1 tablet       Note to Pharmacy: To prevent infection in your abdominal fluid.     1 tablet Oral Daily 11/14/19 1524        PRN meds: sodium chloride, acetaminophen **OR** acetaminophen, baclofen, ondansetron **OR** ondansetron (ZOFRAN) IV, sodium chloride flush   Objective: Vitals:   11/15/19 0621 11/15/19 0949  BP: 110/64 93/60  Pulse: 99 98  Resp: 20 16  Temp: 98.9 F (37.2 C) 98.9 F (37.2 C)  SpO2: 98% 100%     Intake/Output Summary (Last 24 hours) at 11/15/2019 1014 Last data filed at 11/15/2019 1003 Gross per 24 hour  Intake 3 ml  Output 550 ml  Net -547 ml   Filed Weights   11/14/19 1239  Weight: 86.6 kg   Weight change:  Body mass index is 26.64 kg/m.   Physical Exam: General exam: Appears calm and comfortable.  Not in physical distress.  Thin built except with huge ascites Skin: No rashes, lesions or ulcers. HEENT: Atraumatic, normocephalic, supple neck, no obvious bleeding Lungs: Clear to auscultation bilaterally CVS: Regular rate and rhythm, no murmur GI/Abd soft, nontender, he was ascites, bowel sound present CNS: Alert, awake, oriented  x3 Psychiatry: Depressed look Extremities: No pedal edema, no calf tenderness  Data Review: I have personally reviewed the laboratory data and studies available.  Recent Labs  Lab 11/14/19 1303 11/15/19 0835  WBC 7.9 5.8  NEUTROABS 5.2  --   HGB 9.7* 8.6*  HCT 27.8* 24.1*  MCV 98.2 97.6  PLT 162 142*   Recent Labs  Lab 11/14/19 1303 11/15/19 0835  NA 123* 126*  K 3.3* 3.6  CL 90* 91*  CO2 21* 26  GLUCOSE 114* 129*  BUN 40* 38*  CREATININE 2.24* 2.07*  CALCIUM 8.5* 8.7*  MG  --  2.3    Signed, Lorin Glass, MD [facility] 11/15/2019

## 2019-11-15 NOTE — Consult Note (Signed)
[facility]  HISTORY AND PHYSICAL  Alan Phillips is an 54 y.o. male.    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 Pola Corn for eval and recs re: AKI on CKD.    Pt has liver cirrhosis complicated by diuretic resistant ascites, varices, and hepatic encephalopathy.  His cr is usually around 1.4-1.5.  He has hyponatremia in the mid-120s.  Was seen at [facility] 9/8 for initial transplant workup and was noted to have Cr of 2.5 up from baseline and Na 124.  He was told to go to the hospital for eval and rx of hepatorenal syndrome.    He was previously on Lasix/ aldactone but this was stopped d/t renal function concerns.  He has been getting weekly paracentesis of up to 8-10 L, has said that he was going to be cut down to 6L.    He received 25 g IV albumin last night.  Na is 126 and Cr is 2.07 this AM.  In this setting we are asked to see.  BP is in the 100s-120s.    PMH: Past Medical History:  Diagnosis Date  . Cirrhosis of liver (HCC)   . Depression   . Esophageal varices (HCC)    grade 1 (EGD April 2021)  . Psoriasis   . Upper GI bleed    April 2021; secondary to careron lesions and portal gastropathy s/p placement of 2 clips   PSH: Past Surgical History:  Procedure Laterality Date  . BIOPSY  06/20/2019   Procedure: BIOPSY;  Surgeon: West Bali, MD;  [facility];  Service: Endoscopy;;  gastric  . ESOPHAGEAL BANDING N/A 06/20/2019   Procedure: ESOPHAGEAL BANDING;  Surgeon: West Bali, MD;  Location: [facility];  Service: Endoscopy;  Laterality: N/A;  . ESOPHAGOGASTRODUODENOSCOPY (EGD) WITH PROPOFOL N/A 06/20/2019   Procedure: ESOPHAGOGASTRODUODENOSCOPY (EGD) WITH PROPOFOL;  Surgeon: West Bali, MD;  grade 1 esophageal varices, medium sized hiatal hernia, Cameron erosions, and portal gastropathy with some  bleeding which was controlled with application of 2 clips. Bx: Neg H. pylori  . none      Past Medical History:  Diagnosis Date  . Cirrhosis of liver (HCC)   . Depression   . Esophageal varices (HCC)    grade 1 (EGD April 2021)  . Psoriasis   . Upper GI bleed    April 2021; secondary to careron lesions and portal gastropathy s/p placement of 2 clips    Medications:   Scheduled: . heparin  5,000 Units Subcutaneous Q8H  . lactulose  30 g Oral BID  . pantoprazole  40 mg Oral BID AC  . rifaximin  550 mg Oral BID  . sodium chloride flush  3 mL Intravenous Q12H  . sulfamethoxazole-trimethoprim  1 tablet Oral Daily    Medications Prior to Admission  Medication Sig Dispense Refill  . baclofen (LIORESAL) 10 MG tablet TAKE 1 TABLET BY MOUTH THREE TIMES DAILY (Patient taking differently: as needed. ) 90 tablet 2  . CONSTULOSE 10 GM/15ML solution take 30 mls BY MOUTH in THE morning AND AT BEDTIME (Patient taking differently: Take 20 g by mouth 2 (two) times daily. ) 1892 mL 3  . ondansetron (ZOFRAN) 4 MG tablet TAKE 1 TABLET BY MOUTH EVERY 8 HOURS AS NEEDED FOR NAUSEA AND VOMITING (Patient taking  differently: Take 4 mg by mouth every 8 (eight) hours as needed for nausea or vomiting. ) 30 tablet 1  . pantoprazole (PROTONIX) 40 MG tablet Take 1 tablet (40 mg total) by mouth 2 (two) times daily before a meal. 60 tablet 5  . sulfamethoxazole-trimethoprim (BACTRIM DS) 800-160 MG tablet Take 1 tablet by mouth daily. To prevent infection in your abdominal fluid. 30 tablet 11  . furosemide (LASIX) 20 MG tablet Take 1 tablet (20 mg total) by mouth daily. (Patient not taking: Reported on 11/14/2019) 30 tablet 3  . rifaximin (XIFAXAN) 550 MG TABS tablet Take 1 tablet (550 mg total) by mouth 2 (two) times daily. (Patient not taking: Reported on 08/26/2019) 60 tablet 5  . spironolactone (ALDACTONE) 100 MG tablet Take 1 tablet (100 mg total) by mouth daily. (Patient not taking: Reported on 11/14/2019) 30  tablet 5    ALLERGIES:   Allergies  Allergen Reactions  . Codeine Hives, Itching and Swelling    Other reaction(s): Dizziness    FAM HX: Family History  Problem Relation Age of Onset  . Cirrhosis Paternal Uncle        etoh  . Cancer Paternal Uncle   . Alcohol abuse Paternal Uncle   . Alzheimer's disease Father   . Heart failure Maternal Uncle     Social History:   reports that he quit smoking about a year ago. His smoking use included cigarettes. He has a 21.00 pack-year smoking history. He quit smokeless tobacco use about 33 years ago. He reports previous alcohol use. He reports current drug use. Drug: Marijuana.  ROS: ROS: all other systems reviewed and are negative except as per HPI  Blood pressure 93/60, pulse 98, temperature 98.9 F (37.2 C), temperature source Oral, resp. rate 16, height 5\' 11"  (1.803 m), weight 86.6 kg, SpO2 100 %. PHYSICAL EXAM: Physical Exam  GEN NAD, sitting in bed HEENT + temporal wasting NECK no JVD PULM clear CV RRR ABD protuberant, + ascites, + umbilical hernia  EXT 2+ LE edema NEURO AAO x3, some mild cogitive slowing with occasional asterixis SKIN + Jaundice MSK: sarcopenia   Results for orders placed or performed during the hospital encounter of 11/14/19 (from the past 48 hour(s))  CBC with Differential     Status: Abnormal   Collection Time: 11/14/19  1:03 PM  Result Value Ref Range   WBC 7.9 4.0 - 10.5 K/uL   RBC 2.83 (L) 4.22 - 5.81 MIL/uL   Hemoglobin 9.7 (L) 13.0 - 17.0 g/dL   HCT 01/14/20 (L) 39 - 52 %   MCV 98.2 80.0 - 100.0 fL   MCH 34.3 (H) 26.0 - 34.0 pg   MCHC 34.9 30.0 - 36.0 g/dL   RDW 62.3 76.2 - 83.1 %   Platelets 162 150 - 400 K/uL   nRBC 0.0 0.0 - 0.2 %   Neutrophils Relative % 65 %   Neutro Abs 5.2 1.7 - 7.7 K/uL   Lymphocytes Relative 14 %   Lymphs Abs 1.1 0.7 - 4.0 K/uL   Monocytes Relative 18 %   Monocytes Absolute 1.4 (H) 0 - 1 K/uL   Eosinophils Relative 2 %   Eosinophils Absolute 0.1 0 - 0 K/uL    Basophils Relative 0 %   Basophils Absolute 0.0 0 - 0 K/uL   Immature Granulocytes 1 %   Abs Immature Granulocytes 0.04 0.00 - 0.07 K/uL    Comment: Performed at [facility], [address], [place], [place]  Comprehensive  metabolic panel     Status: Abnormal   Collection Time: 11/14/19  1:03 PM  Result Value Ref Range   Sodium 123 (L) 135 - 145 mmol/L   Potassium 3.3 (L) 3.5 - 5.1 mmol/L   Chloride 90 (L) 98 - 111 mmol/L   CO2 21 (L) 22 - 32 mmol/L   Glucose, Bld 114 (H) 70 - 99 mg/dL    Comment: Glucose reference range applies only to samples taken after fasting for at least 8 hours.   BUN 40 (H) 6 - 20 mg/dL   Creatinine, Ser 4.092.24 (H) 0.61 - 1.24 mg/dL   Calcium 8.5 (L) 8.9 - 10.3 mg/dL   Total Protein 6.5 6.5 - 8.1 g/dL   Albumin 3.4 (L) 3.5 - 5.0 g/dL   AST 62 (H) 15 - 41 U/L   ALT 25 0 - 44 U/L   Alkaline Phosphatase 84 38 - 126 U/L   Total Bilirubin 2.5 (H) 0.3 - 1.2 mg/dL   GFR calc non Af Amer 32 (L) >60 mL/min   GFR calc Af Amer 37 (L) >60 mL/min   Anion gap 12 5 - 15    Comment: Performed at [facility], [address] [place], [place] [address]  Protime-INR     Status: Abnormal   Collection Time: 11/14/19  1:03 PM  Result Value Ref Range   Prothrombin Time 15.9 (H) 11.4 - 15.2 seconds   INR 1.3 (H) 0.8 - 1.2    Comment: (NOTE) INR goal varies based on device and disease states. Performed at [facility], [address] [place], [place] [address]   Ammonia     Status: Abnormal   Collection Time: 11/14/19  1:05 PM  Result Value Ref Range   Ammonia 69 (H) 9 - 35 umol/L    Comment: Performed at [facility], [address] [place], [place] [address]  Osmolality     Status: Abnormal   Collection Time: 11/14/19  1:05 PM  Result Value Ref Range   Osmolality 274 (L) 275 - 295 mOsm/kg    Comment: Performed at [facility], [address] [place], [place] [address]  TSH     Status: Abnormal   Collection Time: 11/14/19  1:05 PM  Result Value Ref  Range   TSH 4.874 (H) 0.350 - 4.500 uIU/mL    Comment: Performed by a 3rd Generation assay with a functional sensitivity of <=0.01 uIU/mL. Performed at [facility], [address] [place], [place] [address]   SARS Coronavirus 2 by RT PCR (hospital order, performed in [facility] hospital lab) Nasopharyngeal Nasopharyngeal Swab     Status: None   Collection Time: 11/14/19  2:18 PM   Specimen: Nasopharyngeal Swab  Result Value Ref Range   SARS Coronavirus 2 NEGATIVE NEGATIVE    Comment: (NOTE) SARS-CoV-2 target nucleic acids are NOT DETECTED.  The SARS-CoV-2 RNA is generally detectable in upper and lower respiratory specimens during the acute phase of infection. The lowest concentration of SARS-CoV-2 viral copies this assay can detect is 250 copies / mL. A negative result does not preclude SARS-CoV-2 infection and should not be used as the sole basis for treatment or other patient management decisions.  A negative result may occur with improper specimen collection / handling, submission of specimen other than nasopharyngeal swab, presence of viral mutation(s) within the areas targeted by this assay, and inadequate number of viral copies (<250 copies / mL). A negative result must be combined with clinical observations, patient history,  and epidemiological information.  Fact Sheet for Patients:   BoilerBrush.com.cy  Fact Sheet for Healthcare Providers: https://pope.com/  This test is not yet approved or  cleared by the [place] FDA and has been authorized for detection and/or diagnosis of SARS-CoV-2 by FDA under an Emergency Use Authorization (EUA).  This EUA will remain in effect (meaning this test can be used) for the duration of the COVID-19 declaration under Section 564(b)(1) of the Act, 21 U.S.C. section 360bbb-3(b)(1), unless the authorization is terminated or revoked sooner.  Performed at [facility], [address],  [place], [place] [address]   Urinalysis, Routine w reflex microscopic Urine, Clean Catch     Status: Abnormal   Collection Time: 11/14/19  2:21 PM  Result Value Ref Range   Color, Urine YELLOW YELLOW   APPearance CLEAR CLEAR   Specific Gravity, Urine 1.015 1.005 - 1.030   pH 5.0 5.0 - 8.0   Glucose, UA NEGATIVE NEGATIVE mg/dL   Hgb urine dipstick MODERATE (A) NEGATIVE   Bilirubin Urine NEGATIVE NEGATIVE   Ketones, ur NEGATIVE NEGATIVE mg/dL   Protein, ur NEGATIVE NEGATIVE mg/dL   Nitrite NEGATIVE NEGATIVE   Leukocytes,Ua NEGATIVE NEGATIVE   RBC / HPF 6-10 0 - 5 RBC/hpf   WBC, UA 0-5 0 - 5 WBC/hpf   Bacteria, UA NONE SEEN NONE SEEN   Squamous Epithelial / LPF 0-5 0 - 5   Mucus PRESENT    Hyaline Casts, UA PRESENT     Comment: Performed at [facility], [address] [place], [place] [address]  Sodium, urine, random     Status: None   Collection Time: 11/14/19  2:23 PM  Result Value Ref Range   Sodium, Ur <10 mmol/L    Comment: Performed at [facility], [address], [place], [place] [address]  Creatinine, urine, random     Status: None   Collection Time: 11/14/19  2:23 PM  Result Value Ref Range   Creatinine, Urine 169.91 mg/dL    Comment: Performed at [facility], [address], [place], [place] [address]  Osmolality, urine     Status: None   Collection Time: 11/14/19  3:19 PM  Result Value Ref Range   Osmolality, Ur 430 300 - 900 mOsm/kg    Comment: Performed at [facility], [address], [place], [place] [address]  Magnesium     Status: None   Collection Time: 11/15/19  8:35 AM  Result Value Ref Range   Magnesium 2.3 1.7 - 2.4 mg/dL    Comment: Performed at [facility], [address] [place], [place] [address]  Comprehensive metabolic panel     Status: Abnormal   Collection Time: 11/15/19  8:35 AM  Result Value Ref Range   Sodium 126 (L) 135 - 145 mmol/L   Potassium 3.6 3.5 - 5.1 mmol/L   Chloride 91 (L) 98 - 111 mmol/L   CO2 26 22 - 32 mmol/L   Glucose,  Bld 129 (H) 70 - 99 mg/dL    Comment: Glucose reference range applies only to samples taken after fasting for at least 8 hours.   BUN 38 (H) 6 - 20 mg/dL   Creatinine, Ser 5.78 (H) 0.61 - 1.24 mg/dL   Calcium 8.7 (L) 8.9 - 10.3 mg/dL   Total Protein 5.9 (L) 6.5 - 8.1 g/dL   Albumin 3.3 (L) 3.5 - 5.0 g/dL   AST 50 (H) 15 - 41 U/L   ALT 22 0 - 44 U/L  Alkaline Phosphatase 68 38 - 126 U/L   Total Bilirubin 3.0 (H) 0.3 - 1.2 mg/dL   GFR calc non Af Amer 35 (L) >60 mL/min   GFR calc Af Amer 41 (L) >60 mL/min   Anion gap 9 5 - 15    Comment: Performed at [facility], [address], [place], [place] [address]  Protime-INR     Status: Abnormal   Collection Time: 11/15/19  8:35 AM  Result Value Ref Range   Prothrombin Time 17.2 (H) 11.4 - 15.2 seconds   INR 1.5 (H) 0.8 - 1.2    Comment: (NOTE) INR goal varies based on device and disease states. Performed at [facility], [address] [place], [place] [address]   CBC     Status: Abnormal   Collection Time: 11/15/19  8:35 AM  Result Value Ref Range   WBC 5.8 4.0 - 10.5 K/uL   RBC 2.47 (L) 4.22 - 5.81 MIL/uL   Hemoglobin 8.6 (L) 13.0 - 17.0 g/dL   HCT 51.7 (L) 39 - 52 %   MCV 97.6 80.0 - 100.0 fL   MCH 34.8 (H) 26.0 - 34.0 pg   MCHC 35.7 30.0 - 36.0 g/dL   RDW 61.6 07.3 - 71.0 %   Platelets 142 (L) 150 - 400 K/uL   nRBC 0.0 0.0 - 0.2 %    Comment: Performed at [facility], [address], [place], [place] [address]    US RENAL  Result Date: 11/14/2019 CLINICAL DATA:  Acute kidney injury. EXAM: RENAL / URINARY TRACT ULTRASOUND COMPLETE COMPARISON:  None. FINDINGS: Right Kidney: Renal measurements: 10.8 x 3.8 x 4.9 cm = volume: 166 mL. Echogenicity within normal limits. No mass or hydronephrosis visualized. Left Kidney: Renal measurements: 11.8 x 4.5 x 3.8 cm = volume: 188 mL. Echogenicity within normal limits. No mass or hydronephrosis visualized. Bladder: Only minimally distended. No obvious focal abnormality. Neither ureteral jet  is seen. Other: Ascites is seen on prior exams. IMPRESSION: 1. Unremarkable sonographic appearance of the kidneys. 2. Minimally distended urinary bladder, not well assessed on the current exam. Neither ureteral jet is seen. Electronically Signed   By: Narda Rutherford M.D.   On: 11/14/2019 16:19    Assessment/Plan 1.  AKI on CKD 3: likely d/t hepatorenal syndrome Type 2.  He has improved with albumin, will continue 25 g IV albumin q 6 hr x 48 hrs.  His BP is not low enough for octreotide/ midodrine but if albumin fails to improve things/ if BP becomes lower, mainly in the 90s systolic will consider.  UA is bland and urine Na is < 10.    2.  HRS #2: as above  3.  ESLD 2/2 EtOH cirrhosis: recently seen at [facility].  He usually gets LVPs.  I would suggest decreasing the amount of fluid removed to no more than 4 L on Monday.  Would still give 25 g IV albumin  Beforehand too.  On Bactrim for SBP ppx.  4.  Hepatic encephalopathy: on Lactulose and rifaximin  5.  Muscle spasms: would not use baclofen as this is totally renally excreted.    6.  Dispo: admitted  Bufford Buttner 11/15/2019, 1:59 PM

## 2019-11-16 LAB — CBC WITH DIFFERENTIAL/PLATELET
Abs Immature Granulocytes: 0.02 10*3/uL (ref 0.00–0.07)
Basophils Absolute: 0 10*3/uL (ref 0.0–0.1)
Basophils Relative: 1 %
Eosinophils Absolute: 0.1 10*3/uL (ref 0.0–0.5)
Eosinophils Relative: 3 %
HCT: 22.8 % — ABNORMAL LOW (ref 39.0–52.0)
Hemoglobin: 8 g/dL — ABNORMAL LOW (ref 13.0–17.0)
Immature Granulocytes: 0 %
Lymphocytes Relative: 21 %
Lymphs Abs: 1.1 10*3/uL (ref 0.7–4.0)
MCH: 34.8 pg — ABNORMAL HIGH (ref 26.0–34.0)
MCHC: 35.1 g/dL (ref 30.0–36.0)
MCV: 99.1 fL (ref 80.0–100.0)
Monocytes Absolute: 0.9 10*3/uL (ref 0.1–1.0)
Monocytes Relative: 19 %
Neutro Abs: 2.8 10*3/uL (ref 1.7–7.7)
Neutrophils Relative %: 56 %
Platelets: 127 10*3/uL — ABNORMAL LOW (ref 150–400)
RBC: 2.3 MIL/uL — ABNORMAL LOW (ref 4.22–5.81)
RDW: 13.5 % (ref 11.5–15.5)
WBC: 5 10*3/uL (ref 4.0–10.5)
nRBC: 0 % (ref 0.0–0.2)

## 2019-11-16 LAB — BASIC METABOLIC PANEL
Anion gap: 10 (ref 5–15)
BUN: 35 mg/dL — ABNORMAL HIGH (ref 6–20)
CO2: 23 mmol/L (ref 22–32)
Calcium: 8.7 mg/dL — ABNORMAL LOW (ref 8.9–10.3)
Chloride: 91 mmol/L — ABNORMAL LOW (ref 98–111)
Creatinine, Ser: 1.94 mg/dL — ABNORMAL HIGH (ref 0.61–1.24)
GFR calc Af Amer: 44 mL/min — ABNORMAL LOW (ref >60)
GFR calc non Af Amer: 38 mL/min — ABNORMAL LOW (ref >60)
Glucose, Bld: 123 mg/dL — ABNORMAL HIGH (ref 70–99)
Potassium: 3.1 mmol/L — ABNORMAL LOW (ref 3.5–5.1)
Sodium: 124 mmol/L — ABNORMAL LOW (ref 135–145)

## 2019-11-16 LAB — MAGNESIUM: Magnesium: 2.4 mg/dL (ref 1.7–2.4)

## 2019-11-16 MED ORDER — POTASSIUM CHLORIDE CRYS ER 20 MEQ PO TBCR
40.0000 meq | EXTENDED_RELEASE_TABLET | Freq: Once | ORAL | Status: AC
  Administered 2019-11-16: 40 meq via ORAL
  Filled 2019-11-16: qty 2

## 2019-11-16 NOTE — Progress Notes (Signed)
PROGRESS NOTE  Alan Phillips  DOB: 06/23/1965  PCP: Patient, No Pcp Per MAU:[id number]  DOA: 11/14/2019  LOS: 2 days   Chief Complaint  Patient presents with  . Abnormal Lab    Brief narrative: Alan Phillips is a 54 y.o. male with PMH of prior alcohol abuse, alcoholic liver cirrhosis, esophageal varices, portal gastropathy, recurrent ascites requiring paracentesis. Patient was sent to the ED on 11/14/2019 for elevated creatinine 9/8.  In the ED, patient was slightly tachycardic.  Lab work showed hemoglobin of 9.7, potassium 3.3, sodium 123, BUN/creatinine 40/2.24, ammonia level 69. He was given a dose of IV albumin. Patient was admitted to hospitalist service for further evaluation management.  Subjective: Patient was seen and examined this morning. Lying on bed.  Not in distress. Significant other at bedside. Lab from this morning with improving renal function.  Assessment/Plan: AKI on CKD 3  -Likely due to hepatorenal syndrome.   -Given IV albumin 25 mg every 6 hours for 48 hours -Currently medically stable. 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  Patient is trying to get in their transplant list. -He usually gets low volume paracentesis. -Radiology not available over the weekend.  Plan for paracentesis on Monday, less than 4 L. -Currently on 25 g of IV albumin infusion every 6 hours.   -Continue lactulose and rifaximin.  Aldactone on hold. -Continue to monitor blood pressure. -Continue Bactrim prophylaxis for SBP  Acute on chronic hyponatremia -Likely hypervolemic hyponatremia asymptomatic -Continue fluid restriction 1200 mL  daily -Continue to monitor Recent Labs  Lab 11/14/19 1303 11/15/19 0835 11/16/19 0905  NA 123* 126* 124*   Hypokalemia -Potassium level low at 3.1 this morning.  Replaced.  Recheck. Recent Labs  Lab 11/14/19 1303 11/15/19 0835 11/16/19 0905  K 3.3* 3.6 3.1*  MG  --  2.3 2.4   Chronic blood loss anemia -Currently not bleeding.  Continue to monitor hemoglobin level. Recent Labs    05/15/19 1516 06/18/19 1619 06/19/19 0520 06/19/19 0520 06/19/19 0815 06/20/19 0531 06/21/19 0729 07/25/19 1608 07/27/19 0550 07/28/19 0553 08/14/19 1440 08/15/19 0628 10/02/19 1450 11/14/19 1303 11/15/19 0835 11/16/19 0905  HGB  --    < > 9.4*   < >  --  9.5* 10.4* 8.9* 7.9* 7.6* 9.4* 8.2*  --  9.7* 8.6* 8.0*  VITAMINB12  --   --  917*  --   --   --   --   --   --   --   --   --   --   --   --   --   FOLATE  --   --  7.3  --   --   --   --   --   --   --   --   --   --   --   --   --   FERRITIN 541*  --   --   --  601*  --   --   --   --   --   --   --  415*  --   --   --   TIBC 199*  --   --   --  124*  --   --   --   --   --   --   --  171*  --   --   --   IRON 85  --   --   --  96  --   --   --   --   --   --   --  79  --   --   --    < > = values in this interval not displayed.    History of esophageal varices and portal gastric hypertension related to above -Continue Protonix twice daily  Mobility: Encourage ambulation Code Status:   Code Status: Full Code  Nutritional status: Body mass index is 26.04 kg/m.     Diet Order            Diet Heart Room service appropriate? Yes; Fluid consistency: Thin; Fluid restriction: 1200 mL Fluid  Diet effective now                 DVT prophylaxis: heparin injection 5,000 Units Start: 11/14/19 1600   Antimicrobials:  Bactrim Fluid: None Consultants: Nephrology Family Communication:  Significant other at bedside  Status is: Inpatient  Remains inpatient appropriate because pending paracentesis tomorrow  Dispo: The patient is  from: Home              Anticipated d/c is to: Home              Anticipated d/c date is: 1-2 days              Patient currently is not medically stable to d/c.       Infusions:  . sodium chloride    . albumin human 25 g (11/16/19 0953)    Scheduled Meds: . heparin  5,000 Units Subcutaneous Q8H  . lactulose  30 g Oral BID  . pantoprazole  40 mg Oral BID AC  . rifaximin  550 mg Oral BID  . sodium chloride flush  3 mL Intravenous Q12H  . sulfamethoxazole-trimethoprim  1 tablet Oral Daily    Antimicrobials: Anti-infectives (From admission, onward)   Start     Dose/Rate Route Frequency Ordered Stop   11/14/19 1600  rifaximin (XIFAXAN) tablet 550 mg        550 mg Oral 2 times daily 11/14/19 1524     11/14/19 1600  sulfamethoxazole-trimethoprim (BACTRIM DS) 800-160 MG per tablet 1 tablet       Note to Pharmacy: To prevent infection in your abdominal fluid.     1 tablet Oral Daily 11/14/19 1524        PRN meds: sodium chloride, acetaminophen **OR** acetaminophen, baclofen, ondansetron **OR** ondansetron (ZOFRAN) IV, sodium chloride flush   Objective: Vitals:   11/15/19 2137 11/16/19 0617  BP: 97/64 94/63  Pulse: 98 88  Resp: 16 15  Temp: 98.9 F (37.2 C) 98.2 F (36.8 C)  SpO2: 99% 100%    Intake/Output Summary (Last 24 hours) at 11/16/2019 1357 Last data filed at 11/16/2019 0400 Gross per 24 hour  Intake 1169.83 ml  Output --  Net 1169.83 ml   Filed Weights   11/14/19 0612 11/14/19 1239 11/16/19 0500  Weight: 85.5 kg 86.6 kg 84.7 kg   Weight change: -1.95 kg Body mass index is 26.04 kg/m.   Physical Exam: General exam: Appears calm and comfortable.  Not in physical distress. Thin built except with huge ascites Skin: No rashes, lesions or ulcers. HEENT: Atraumatic, normocephalic, supple neck, no obvious  bleeding Lungs: Clear to auscultation bilaterally CVS: Regular rate and rhythm, no murmur GI/Abd soft, nontender, distended from ascites. CNS: Alert,  awake, oriented  x3 Psychiatry: Depressed look Extremities: No pedal edema, no calf tenderness  Data Review: I have personally reviewed the laboratory data and studies available.  Recent Labs  Lab 11/14/19 1303 11/15/19 0835 11/16/19 0905  WBC 7.9 5.8 5.0  NEUTROABS 5.2  --  2.8  HGB 9.7* 8.6* 8.0*  HCT 27.8* 24.1* 22.8*  MCV 98.2 97.6 99.1  PLT 162 142* 127*   Recent Labs  Lab 11/14/19 1303 11/15/19 0835 11/16/19 0905  NA 123* 126* 124*  K 3.3* 3.6 3.1*  CL 90* 91* 91*  CO2 21* 26 23  GLUCOSE 114* 129* 123*  BUN 40* 38* 35*  CREATININE 2.24* 2.07* 1.94*  CALCIUM 8.5* 8.7* 8.7*  MG  --  2.3 2.4    Signed, Lorin Glass, MD [facility] 11/16/2019

## 2019-11-16 NOTE — TOC Initial Note (Signed)
Transition of Care [facility]) - Initial/Assessment Note    Patient Details  Name: Alan Phillips MRN: [id number] Date of Birth: 05-28-1965  Transition of Care [facility]) CM/SW Contact:    Barry Brunner, LCSW Phone Number: 11/16/2019, 10:26 AM  Clinical Narrative:                 Patient is a 54 year old male admitted for acute kidney injury. CSW conducted an assessment for patient. Patient's wife reported that they live in a single family home that has a ramp access. Patient's wife reported that patient utlizes a cane and scooter to access the home. CSW inquired about patient's ins status and ability to afford medications. Patient's spouse reported that they have applied for medicaid and are currently waiting to hear back from them. Prior to applying for medicaid, patient's spouse explained that they did not have difficulty affording medications. TOC to follow.   Expected Discharge Plan: Home/Self Care Barriers to Discharge: Continued Medical Work up   Patient Goals and CMS Choice Patient states their goals for this hospitalization and ongoing recovery are:: Return home   Choice offered to / list presented to : Patient  Expected Discharge Plan and Services Expected Discharge Plan: Home/Self Care       Living arrangements for the past 2 months: Single Family Home                                      Prior Living Arrangements/Services Living arrangements for the past 2 months: Single Family Home Lives with:: Self, Spouse Patient language and need for interpreter reviewed:: Yes        Need for Family Participation in Patient Care: Yes (Comment) Care giver support system in place?: Yes (comment) Current home services: DME (Cane and scooter. Home also has a ramp) Criminal Activity/Legal Involvement Pertinent to Current Situation/Hospitalization: No - Comment as needed  Activities of Daily Living Home Assistive Devices/Equipment: Cane (specify quad or straight), Blood pressure  cuff ADL Screening (condition at time of admission) Patient's cognitive ability adequate to safely complete daily activities?: Yes Is the patient deaf or have difficulty hearing?: No Does the patient have difficulty seeing, even when wearing glasses/contacts?: No Does the patient have difficulty concentrating, remembering, or making decisions?: No Patient able to express need for assistance with ADLs?: Yes Does the patient have difficulty dressing or bathing?: No (periodically needs assistance) Independently performs ADLs?: Yes (appropriate for developmental age) Does the patient have difficulty walking or climbing stairs?: Yes Weakness of Legs: None Weakness of Arms/Hands: None  Permission Sought/Granted      Share Information with NAME: Alan Phillips  Permission granted to share info w AGENCY: N/A  Permission granted to share info w Relationship: spouse  Permission granted to share info w Contact Information: [phone]  Emotional Assessment     Affect (typically observed): Accepting, Adaptable Orientation: : Oriented to Self, Oriented to Place, Oriented to  Time, Oriented to Situation Alcohol / Substance Use: Not Applicable Psych Involvement: No (comment)  Admission diagnosis:  AKI (acute kidney injury) (HCC) [N17.9] Patient Active Problem List   Diagnosis Date Noted   AKI (acute kidney injury) (HCC) 11/14/2019   H/O: upper GI bleed 10/05/2019   Nausea with vomiting 10/01/2019   Rectal bleeding 08/14/2019   Low blood pressure 08/14/2019   Ascites due to alcoholic cirrhosis (HCC) 08/14/2019   SBP (spontaneous bacterial peritonitis) (HCC) 07/25/2019  Acute GI bleeding 06/19/2019   Hematemesis 06/18/2019   Hyponatremia    Hypokalemia    Macrocytic anemia    Coagulopathy (HCC)    Prolonged QT interval    Hepatic cirrhosis (HCC) 05/14/2019   Anasarca 05/14/2019   PCP:  Patient, No Pcp Per Pharmacy:   [facility] [place] [place] [place] - [address] [place] [address] Phone: [phone] Fax: [phone]     Social Determinants of Health (SDOH) Interventions    Readmission Risk Interventions Readmission Risk Prevention Plan 11/16/2019  Transportation Screening Complete  PCP or Specialist Appt within 3-5 Days Complete  HRI or Home Care Consult Complete  Social Work Consult for Recovery Care Planning/Counseling Complete  Palliative Care Screening Not Applicable  Medication Review [employer]) Complete

## 2019-11-16 NOTE — Progress Notes (Signed)
[facility] Progress Note    Assessment/ Plan:   1.  AKI on CKD 3: likely d/t hepatorenal syndrome Type 2.  He has improved with albumin, will continue 25 g IV albumin q 6 hr x 48 hrs, to end tomorrow AM.  His BP is not low enough for octreotide/ midodrine but if albumin fails to improve things/ if BP becomes lower, mainly in the 90s systolic will consider.  UA is bland and urine Na is < 10.    2.  HRS #2: as above  3.  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  He usually gets LVPs.  I would suggest decreasing the amount of fluid removed to no more than 4 L on Monday.  Would still give 25 g IV albumin beforehand too.  On Bactrim for SBP ppx.  4.  Hepatic encephalopathy: on Lactulose and rifaximin  5.  Muscle spasms: would not use baclofen as this is totally renally excreted.    6.  Hyponatremia: hypervolemic hyponatremia, fluid restriction.  7.  Dispo: admitted  Subjective:    Seen in room.  Cr coming down to 1.9.  Getting albumin.     Objective:   BP 94/63 (BP Location: Left Arm)   Pulse 88   Temp 98.2 F (36.8 C) (Oral)   Resp 15   Ht 5\' 11"  (1.803 m)   Wt 84.7 kg   SpO2 100%   BMI 26.04 kg/m   Intake/Output Summary (Last 24 hours) at 11/16/2019 1351 Last data filed at 11/16/2019 0400 Gross per 24 hour  Intake 1169.83 ml  Output --  Net 1169.83 ml   Weight change: -1.95 kg  Physical Exam: GEN NAD, sitting in bed HEENT + temporal wasting NECK no JVD PULM clear CV RRR ABD protuberant, + ascites, + umbilical hernia  EXT 2+ LE edema NEURO AAO x3, some mild cogitive slowing with occasional asterixis SKIN + Jaundice MSK: sarcopenia  Imaging: 01/16/2020 RENAL  Result Date: 11/14/2019 CLINICAL DATA:  Acute kidney injury. EXAM: RENAL / URINARY TRACT ULTRASOUND COMPLETE COMPARISON:  None. FINDINGS: Right Kidney: Renal measurements: 10.8 x 3.8 x 4.9 cm = volume: 166 mL. Echogenicity within normal limits. No mass or hydronephrosis visualized. Left  Kidney: Renal measurements: 11.8 x 4.5 x 3.8 cm = volume: 188 mL. Echogenicity within normal limits. No mass or hydronephrosis visualized. Bladder: Only minimally distended. No obvious focal abnormality. Neither ureteral jet is seen. Other: Ascites is seen on prior exams. IMPRESSION: 1. Unremarkable sonographic appearance of the kidneys. 2. Minimally distended urinary bladder, not well assessed on the current exam. Neither ureteral jet is seen. Electronically Signed   By: 01/14/2020 M.D.   On: 11/14/2019 16:19    Labs: BMET Recent Labs  Lab 11/14/19 1303 11/15/19 0835 11/16/19 0905  NA 123* 126* 124*  K 3.3* 3.6 3.1*  CL 90* 91* 91*  CO2 21* 26 23  GLUCOSE 114* 129* 123*  BUN 40* 38* 35*  CREATININE 2.24* 2.07* 1.94*  CALCIUM 8.5* 8.7* 8.7*   CBC Recent Labs  Lab 11/14/19 1303 11/15/19 0835 11/16/19 0905  WBC 7.9 5.8 5.0  NEUTROABS 5.2  --  2.8  HGB 9.7* 8.6* 8.0*  HCT 27.8* 24.1* 22.8*  MCV 98.2 97.6 99.1  PLT 162 142* 127*    Medications:    . heparin  5,000 Units Subcutaneous Q8H  . lactulose  30 g Oral BID  . pantoprazole  40 mg Oral BID AC  . rifaximin  550 mg  Oral BID  . sodium chloride flush  3 mL Intravenous Q12H  . sulfamethoxazole-trimethoprim  1 tablet Oral Daily      Bufford Buttner MD 11/16/2019, 1:51 PM

## 2019-11-17 ENCOUNTER — Inpatient Hospital Stay (HOSPITAL_COMMUNITY): Payer: Self-pay

## 2019-11-17 DIAGNOSIS — N179 Acute kidney failure, unspecified: Principal | ICD-10-CM

## 2019-11-17 LAB — CBC WITH DIFFERENTIAL/PLATELET
Abs Immature Granulocytes: 0.01 10*3/uL (ref 0.00–0.07)
Basophils Absolute: 0 10*3/uL (ref 0.0–0.1)
Basophils Relative: 1 %
Eosinophils Absolute: 0.1 10*3/uL (ref 0.0–0.5)
Eosinophils Relative: 2 %
HCT: 22.4 % — ABNORMAL LOW (ref 39.0–52.0)
Hemoglobin: 7.6 g/dL — ABNORMAL LOW (ref 13.0–17.0)
Immature Granulocytes: 0 %
Lymphocytes Relative: 19 %
Lymphs Abs: 0.9 10*3/uL (ref 0.7–4.0)
MCH: 33.8 pg (ref 26.0–34.0)
MCHC: 33.9 g/dL (ref 30.0–36.0)
MCV: 99.6 fL (ref 80.0–100.0)
Monocytes Absolute: 0.8 10*3/uL (ref 0.1–1.0)
Monocytes Relative: 17 %
Neutro Abs: 2.8 10*3/uL (ref 1.7–7.7)
Neutrophils Relative %: 61 %
Platelets: 111 10*3/uL — ABNORMAL LOW (ref 150–400)
RBC: 2.25 MIL/uL — ABNORMAL LOW (ref 4.22–5.81)
RDW: 13.4 % (ref 11.5–15.5)
WBC: 4.6 10*3/uL (ref 4.0–10.5)
nRBC: 0 % (ref 0.0–0.2)

## 2019-11-17 LAB — BASIC METABOLIC PANEL
Anion gap: 12 (ref 5–15)
BUN: 30 mg/dL — ABNORMAL HIGH (ref 6–20)
CO2: 23 mmol/L (ref 22–32)
Calcium: 9.2 mg/dL (ref 8.9–10.3)
Chloride: 93 mmol/L — ABNORMAL LOW (ref 98–111)
Creatinine, Ser: 1.78 mg/dL — ABNORMAL HIGH (ref 0.61–1.24)
GFR calc Af Amer: 49 mL/min — ABNORMAL LOW (ref >60)
GFR calc non Af Amer: 42 mL/min — ABNORMAL LOW (ref >60)
Glucose, Bld: 95 mg/dL (ref 70–99)
Potassium: 3.8 mmol/L (ref 3.5–5.1)
Sodium: 128 mmol/L — ABNORMAL LOW (ref 135–145)

## 2019-11-17 LAB — HEPATIC FUNCTION PANEL
ALT: 16 U/L (ref 0–44)
AST: 32 U/L (ref 15–41)
Albumin: 4.2 g/dL (ref 3.5–5.0)
Alkaline Phosphatase: 53 U/L (ref 38–126)
Bilirubin, Direct: 0.6 mg/dL — ABNORMAL HIGH (ref 0.0–0.2)
Indirect Bilirubin: 2.8 mg/dL — ABNORMAL HIGH (ref 0.3–0.9)
Total Bilirubin: 3.4 mg/dL — ABNORMAL HIGH (ref 0.3–1.2)
Total Protein: 5.9 g/dL — ABNORMAL LOW (ref 6.5–8.1)

## 2019-11-17 LAB — PROTIME-INR
INR: 1.8 — ABNORMAL HIGH (ref 0.8–1.2)
Prothrombin Time: 20.2 seconds — ABNORMAL HIGH (ref 11.4–15.2)

## 2019-11-17 NOTE — Procedures (Signed)
INDICATION: Ascites  EXAM: ULTRASOUND GUIDED left PARACENTESIS  GRIP-IR: Category: Fluids    Subcategory: Paracentesis    Follow-Up: None    MEDICATIONS: None.  COMPLICATIONS: None immediate.  PROCEDURE: Informed written consent was obtained from the patient after a discussion of the risks (including hemorrhage and infection, among others), benefits and alternatives to treatment. A timeout was performed prior to the initiation of the procedure.    Initial ultrasound scanning demonstrates a large amount of ascites within the left lower abdominal quadrant. The left lower abdomen was prepped and draped in the usual sterile fashion. 1% lidocaine  was used for local anesthesia.     Following this, a Yueh catheter was introduced. An ultrasound image was saved for documentation purposes. The paracentesis was performed. The catheter was removed and a dressing was applied. The patient tolerated the procedure well without immediate post procedural complication.    FINDINGS: A total of approximately 3.5 L of straw-colored fluid was removed.  IMPRESSION:  Successful ultrasound-guided paracentesis yielding 3.5 liters of peritoneal fluid.

## 2019-11-17 NOTE — Progress Notes (Signed)
Removed 3 1/2 liters during thoracentesis .  Has had intermittent nausea today and received zofran.  Dressing to left abd dry and intact. Fiance has been at bedside most to day.

## 2019-11-17 NOTE — Progress Notes (Signed)
MELD Na 28 with today's labs. INR 1.8. Follow clinically.

## 2019-11-17 NOTE — Consult Note (Signed)
Referring Provider: Dr. Sherryll BurgerShah Primary Care Physician:  Patient, No Pcp Per Primary Gastroenterologist:  Dr. Jena Gaussourk   Date of Admission:  11/14/19 Date of Consultation: 11/17/19  Reason for Consultation:  ETOH cirrhosis with ascites   HPI:  Alan Phillips is a 54 y.o. year old male [facility] with history of decompensated alcoholic cirrhosis, hepatic encephalopathy, need for frequent LVAPs and diuretic therapy limited in setting of hyponatremia and hypotension, SBP in May 2021 and on Bactrim now for prophylaxis, GI bleed in April 2021 felt multifactorial in setting of Cameron erosions, portal gastropathy, gastritis; he did have Grade 1 varices on exam. He has been unable to tolerate non-selective beta blocker therapy. Has been off diuretics for at least a few weeks due to renal status. High MELD precludes TIPS placement currently. He was seen by [facility] most recently 11/12/19. Not a transplant candidate currently as lacking health insurance. [facility] and advised to present to the ED; [facility] Dall[facility]erg[facility], if creatinine does not improve despite supportive measures here, [facility]. He will be seeing [facility] in mid October again, currently with need to obtain insurance.   Baseline creatinine has been around 1.5 just prior to admission, [facility] Nephrology is on board; patient felt to have hepatorenal syndrome Type 2. [name] have been suggested to have max fluid removal of no more than 4 liters at a time, providing Albumin 25 g IV prior to para. Scheduled for para today. He had been receiving Albumin every 6 hours since 9/11, and will be completing last dose this morning. Currently no on octreotide/midodrine: nephrology managing.  He denies any abdominal pain. Mild "fuzzy-headed" at times but alert and oriented X 4. No overt GI bleeding. Appetite is fair. He is  unable to eat large amounts due to tense ascites. Continues on lactulose and xifaxan. No dysphagia. No added salt; he has been adhering to low salt diet. No alcohol since end of January 2021. Intermittent marijuana use in the past, but he has stopped this now, too. Girlfriend at bedside; they will be getting married Oct 31st.     Past Medical History:  Diagnosis Date  . Cirrhosis of liver (HCC)   . Depression   . Esophageal varices (HCC)    grade 1 (EGD April 2021)  . Psoriasis   . Upper GI bleed    April 2021; secondary to careron lesions and portal gastropathy s/p placement of 2 clips    Past Surgical History:  Procedure Laterality Date  . BIOPSY  06/20/2019   Procedure: BIOPSY;  Surgeon: West BaliFields, Sandi L, MD;  Location: [facility] ENDO [facility];  Service: Endoscopy;;  gastric  . ESOPHAGEAL BANDING N/A 06/20/2019   Procedure: ESOPHAGEAL BANDING;  Surgeon: West BaliFields, Sandi L, MD;  Location: [facility];  Service: Endoscopy;  Laterality: N/A;  . ESOPHAGOGASTRODUODENOSCOPY (EGD) WITH PROPOFOL N/A 06/20/2019   Procedure: ESOPHAGOGASTRODUODENOSCOPY (EGD) WITH PROPOFOL;  Surgeon: West BaliFields, Sandi L, MD;  grade 1 esophageal varices, medium sized hiatal hernia, Cameron erosions, and portal gastropathy with some bleeding which was controlled with application of 2 clips. Bx: Neg H. pylori  . none      Prior to Admission medications   Medication Sig Start Date End Date Taking? Authorizing Provider  baclofen (LIORESAL) 10 MG tablet TAKE 1 TABLET BY MOUTH THREE TIMES DAILY Patient taking differently: as needed.  09/09/19  Yes  Tiffany Kocher, PA-C  CONSTULOSE 10 GM/15ML solution take 30 mls BY MOUTH in THE morning AND AT BEDTIME Patient taking differently: Take 20 g by mouth 2 (two) times daily.  10/16/19  Yes Anice Paganini, NP  ondansetron (ZOFRAN) 4 MG tablet TAKE 1 TABLET BY MOUTH EVERY 8 HOURS AS NEEDED FOR NAUSEA AND VOMITING Patient taking differently: Take 4 mg by mouth every 8 (eight) hours as needed for  nausea or vomiting.  11/01/19  Yes Gelene Mink, NP  pantoprazole (PROTONIX) 40 MG tablet Take 1 tablet (40 mg total) by mouth 2 (two) times daily before a meal. 09/02/19 09/01/20 Yes Gelene Mink, NP  sulfamethoxazole-trimethoprim (BACTRIM DS) 800-160 MG tablet Take 1 tablet by mouth daily. To prevent infection in your abdominal fluid. 08/06/19  Yes Tiffany Kocher, PA-C  furosemide (LASIX) 20 MG tablet Take 1 tablet (20 mg total) by mouth daily. Patient not taking: Reported on 11/14/2019 10/03/19 10/02/20  Letta Median, PA-C  rifaximin (XIFAXAN) 550 MG TABS tablet Take 1 tablet (550 mg total) by mouth 2 (two) times daily. Patient not taking: Reported on 08/26/2019 08/07/19   Tiffany Kocher, PA-C  spironolactone (ALDACTONE) 100 MG tablet Take 1 tablet (100 mg total) by mouth daily. Patient not taking: Reported on 11/14/2019 07/23/19   Tiffany Kocher, PA-C    Current Facility-Administered Medications  Medication Dose Route Frequency Provider Last Rate Last Admin  . 0.9 %  sodium chloride infusion  250 mL Intravenous PRN Maurilio Lovely D, DO 10 mL/hr at 11/17/19 0756 250 mL at 11/17/19 0756  . acetaminophen (TYLENOL) tablet 650 mg  650 mg Oral Q6H PRN Sherryll Burger, Pratik D, DO       Or  . acetaminophen (TYLENOL) suppository 650 mg  650 mg Rectal Q6H PRN Sherryll Burger, Pratik D, DO      . baclofen (LIORESAL) tablet 10 mg  10 mg Oral PRN Sherryll Burger, Pratik D, DO   10 mg at 11/15/19 1751  . heparin injection 5,000 Units  5,000 Units Subcutaneous Q8H Shah, Pratik D, DO   5,000 Units at 11/17/19 0511  . lactulose (CHRONULAC) 10 GM/15ML solution 30 g  30 g Oral BID Sherryll Burger, Pratik D, DO   30 g at 11/16/19 0944  . ondansetron (ZOFRAN) tablet 4 mg  4 mg Oral Q6H PRN Sherryll Burger, Pratik D, DO   4 mg at 11/15/19 1009   Or  . ondansetron (ZOFRAN) injection 4 mg  4 mg Intravenous Q6H PRN Sherryll Burger, Pratik D, DO   4 mg at 11/17/19 0808  . pantoprazole (PROTONIX) EC tablet 40 mg  40 mg Oral BID AC Shah, Pratik D, DO   40 mg at 11/17/19 0801  .  rifaximin (XIFAXAN) tablet 550 mg  550 mg Oral BID Maurilio Lovely D, DO   550 mg at 11/17/19 0801  . sodium chloride flush (NS) 0.9 % injection 3 mL  3 mL Intravenous Q12H Shah, Pratik D, DO   3 mL at 11/17/19 0803  . sodium chloride flush (NS) 0.9 % injection 3 mL  3 mL Intravenous PRN Sherryll Burger, Pratik D, DO      . sulfamethoxazole-trimethoprim (BACTRIM DS) 800-160 MG per tablet 1 tablet  1 tablet Oral Daily Sherryll Burger, Pratik D, DO   1 tablet at 11/17/19 0801    Allergies as of 11/14/2019 - Review Complete 11/14/2019  Allergen Reaction Noted  . Codeine Hives, Itching, and Swelling 05/14/2019    Family History  Problem Relation Age of Onset  .  Cirrhosis Paternal Uncle        etoh  . Cancer Paternal Uncle   . Alcohol abuse Paternal Uncle   . Alzheimer's disease Father   . Heart failure Maternal Uncle     Social History   Socioeconomic History  . Marital status: Married    Spouse name: Not on file  . Number of children: Not on file  . Years of education: Not on file  . Highest education level: Not on file  Occupational History  . Not on file  Tobacco Use  . Smoking status: Former Smoker    Packs/day: 0.50    Years: 42.00    Pack years: 21.00    Types: Cigarettes    Quit date: 12/05/2018    Years since quitting: 0.9  . Smokeless tobacco: Former Neurosurgeon    Quit date: 05/14/1986  . Tobacco comment: quit last year  Vaping Use  . Vaping Use: Never used  Substance and Sexual Activity  . Alcohol use: Not Currently    Comment: h/o longtime etoh abuse but quit 04/2019 when he found out he has cirrhosis (05/14/19)  . Drug use: Yes    Types: Marijuana    Comment: few times a week   . Sexual activity: Not on file  Other Topics Concern  . Not on file  Social History Narrative  . Not on file   Social Determinants of Health   Financial Resource Strain:   . Difficulty of Paying Living Expenses: Not on file  Food Insecurity:   . Worried About Programme researcher, broadcasting/film/video in the Last Year: Not on file   . Ran Out of Food in the Last Year: Not on file  Transportation Needs:   . Lack of Transportation (Medical): Not on file  . Lack of Transportation (Non-Medical): Not on file  Physical Activity:   . Days of Exercise per Week: Not on file  . Minutes of Exercise per Session: Not on file  Stress:   . Feeling of Stress : Not on file  Social Connections:   . Frequency of Communication with Friends and Family: Not on file  . Frequency of Social Gatherings with Friends and Family: Not on file  . Attends Religious Services: Not on file  . Active Member of Clubs or Organizations: Not on file  . Attends Banker Meetings: Not on file  . Marital Status: Not on file  Intimate Partner Violence:   . Fear of Current or Ex-Partner: Not on file  . Emotionally Abused: Not on file  . Physically Abused: Not on file  . Sexually Abused: Not on file    Review of Systems: Gen: see HPI CV: Denies chest pain, heart palpitations, syncope, edema  Resp: Denies shortness of breath with rest, cough, wheezing GI: see HPI GU : Denies urinary burning, urinary frequency, urinary incontinence.  MS: Denies joint pain,swelling, cramping Derm: Denies rash, itching, dry skin Psych: Denies depression, anxiety,confusion, or memory loss Heme: see HPI  Physical Exam: Vital signs in last 24 hours: Temp:  [97.5 F (36.4 C)-98.7 F (37.1 C)] 98.2 F (36.8 C) (09/13 0611) Pulse Rate:  [96-99] 97 (09/13 0611) Resp:  [14-17] 17 (09/13 0611) BP: (97-104)/(58-73) 97/66 (09/13 0611) SpO2:  [98 %-100 %] 98 % (09/13 8309) Weight:  [84.7 kg] 84.7 kg (09/13 0501) Last BM Date: 11/15/19 General:   Alert, pleasant and cooperative, thin upper extremities, chronically ill-appearing, sallow complexion Head:  Normocephalic and atraumatic. Eyes:  Sclera clear, no icterus.  Conjunctiva pink. Ears:  Normal auditory acuity. Nose:  No deformity, discharge,  or lesions. Mouth:  edentulous Lungs:  Clear throughout to  auscultation.    Heart:  S1 S2 present without murmurs  Abdomen:  Tense ascites, protruding umbilical hernia with thin translucent skin, no TTP  Rectal:  Deferred  Msk:  Symmetrical without gross deformities. Normal posture. Extremities:  With pedal edema, 1-2+ pitting lower extremity edema, 1+ edema upper thigh Neurologic:  Alert and  oriented x4;  Negative asterixis  Psych:  Alert and cooperative. Normal mood and affect.  Intake/Output from previous day: 09/12 0701 - 09/13 0700 In: -  Out: 375 [Urine:375] Intake/Output this shift: No intake/output data recorded.  Lab Results: Recent Labs    11/14/19 1303 11/15/19 0835 11/16/19 0905  WBC 7.9 5.8 5.0  HGB 9.7* 8.6* 8.0*  HCT 27.8* 24.1* 22.8*  PLT 162 142* 127*   BMET Recent Labs    11/15/19 0835 11/16/19 0905 11/17/19 0907  NA 126* 124* 128*  K 3.6 3.1* 3.8  CL 91* 91* 93*  CO2 26 23 23   GLUCOSE 129* 123* 95  BUN 38* 35* 30*  CREATININE 2.07* 1.94* 1.78*  CALCIUM 8.7* 8.7* 9.2   LFT Recent Labs    11/14/19 1303 11/15/19 0835  PROT 6.5 5.9*  ALBUMIN 3.4* 3.3*  AST 62* 50*  ALT 25 22  ALKPHOS 84 68  BILITOT 2.5* 3.0*   PT/INR Recent Labs    11/14/19 1303 11/15/19 0835  LABPROT 15.9* 17.2*  INR 1.3* 1.5*     Impression: 54 year old male with history of end-stage cirrhosis due to alcohol, history of hepatic encephalopathy well-managed on lactulose and Xifaxan, frequent LVAPs due to ascites and inability to tolerate diuretic therapy, SBP in May 2021, presenting to the ED at [facility] request due to worsening renal function.   Admitted with acute on chronic kidney disease likely secondary to hepatorenal syndrome Type 2, with improvement in renal function since admission. Completing last round of Albumin IV today per Nephrology; octreotide/midorine was not started but with low threshold. Nephrology managing.   Tense ascites: with para planned for today of no more than 4 liters. Will need to continue this at  discharge with no more than 4 liters removed at a time and 25 grams IV albumin at time of para. History of SBP and on Bactrim for prophylaxis; discussed with Nephrology, who is requesting change in regimen due to renal disease. Would change to Cipro, but he has history or prolonged QT interval on EKG during prior hospitalization. Discussed with hospitalist, and I have ordered a repeat EKG for today. Has already received Bactrim this morning.   Will need to have close follow-up with [facility] as already planned; he is in the process of obtaining Medicaid. Continue ETOH avoidance; last intake end of Jan 2021.      Plan: INR, BMP, HFP today. Will calculate MELD No more than 4 liters at time of para and will need 25 grams Albumin IV at start: discussed with Feb 2021 tech Check EKG today Will need to discontinue Bactrim and provide alternative agent (?Cipro): awaiting EKG Continue Xifaxan and Lactulose dosing Keep follow-up with [facility] mid October 2021 Will continue to follow with you   November 2021, PhD, ANP-BC [facility]     LOS: 3 days    11/17/2019, 9:55 AM

## 2019-11-17 NOTE — Progress Notes (Signed)
Paracentesis complete no signs of distress.  

## 2019-11-17 NOTE — Progress Notes (Signed)
  [facility] Progress Note    Assessment/ Plan:   1.  AKI on CKD 3: likely d/t hepatorenal syndrome Type 2. Albumin completign today.  No AML yet His BP is not low enough for octreotide/ midodrine but if albumin fails to improve things/ if BP becomes lower, mainly in the 90s systolic will consider.  UA is bland and urine Na is < 10.    2.  HRS #2: as above  3.  ESLD 2/2 EtOH cirrhosis: recently seen [facility].  He usually gets LVPs. Agree with decreasing the amount of fluid removed to no more than 4 L on Monday.  Would give 25 g IV albumin beforehand too.  On Bactrim for SBP ppx but I think this is unwise given renal disease, d/w GI who will consider using FQ or other ABX  4.  Hepatic encephalopathy: on Lactulose and rifaximin  5.  Muscle spasms: would try to avoid baclofen as this is totally renally excreted.    6.  Hyponatremia: hypervolemic hyponatremia 2/2 #3, cont fluid restriction.  Subjective:    Seen in room.   No AML yet For LVP today 0.4L UOP recorded yesterday Not sure why on TMP/SMX for SBP prophylaxis     Objective:   BP 97/66 (BP Location: Left Arm)   Pulse 97   Temp 98.2 F (36.8 C)   Resp 17   Ht 5\' 11"  (1.803 m)   Wt 84.7 kg   SpO2 98%   BMI 26.04 kg/m   Intake/Output Summary (Last 24 hours) at 11/17/2019 0916 Last data filed at 11/17/2019 11/19/2019 Gross per 24 hour  Intake --  Output 375 ml  Net -375 ml   Weight change: 0.013 kg  Physical Exam: GEN NAD, lying in bed HEENT + temporal wasting NECK no JVD PULM clear CV RRR ABD protuberant, + ascites, + umbilical hernia  EXT 2+ LE edema NEURO AAO x3, some mild cogitive slowing with occasional asterixis SKIN + Jaundice MSK: sarcopenia  Imaging: No results found.  Labs: BMET Recent Labs  Lab 11/14/19 1303 11/15/19 0835 11/16/19 0905  NA 123* 126* 124*  K 3.3* 3.6 3.1*  CL 90* 91* 91*  CO2 21* 26 23  GLUCOSE 114* 129* 123*  BUN 40* 38* 35*  CREATININE 2.24* 2.07* 1.94*   CALCIUM 8.5* 8.7* 8.7*   CBC Recent Labs  Lab 11/14/19 1303 11/15/19 0835 11/16/19 0905  WBC 7.9 5.8 5.0  NEUTROABS 5.2  --  2.8  HGB 9.7* 8.6* 8.0*  HCT 27.8* 24.1* 22.8*  MCV 98.2 97.6 99.1  PLT 162 142* 127*    Medications:    . heparin  5,000 Units Subcutaneous Q8H  . lactulose  30 g Oral BID  . pantoprazole  40 mg Oral BID AC  . rifaximin  550 mg Oral BID  . sodium chloride flush  3 mL Intravenous Q12H  . sulfamethoxazole-trimethoprim  1 tablet Oral Daily      01/16/20  MD 11/17/2019, 9:16 AM

## 2019-11-17 NOTE — Progress Notes (Signed)
PROGRESS NOTE  Alan Phillips  DOB: [birth year]  PCP: Patient, No Pcp Per IWP:[id number]  DOA: 11/14/2019  LOS: 3 days   Chief Complaint  Patient presents with  . Abnormal Lab    Brief narrative: Alan Phillips is a 54 y.o. male with PMH of prior alcohol abuse, alcoholic liver cirrhosis, esophageal varices, portal gastropathy, recurrent ascites requiring paracentesis. Patient was sent to the ED on 11/14/2019 for elevated creatinine 9/8.  In the ED, patient was slightly tachycardic.  Lab work showed hemoglobin of 9.7, potassium 3.3, sodium 123, BUN/creatinine 40/2.24, ammonia level 69. He was given a dose of IV albumin. Patient was admitted to hospitalist service for further evaluation management.  Subjective: Patient was seen and examined this morning. He had 3.5 L of acetic fluid tapped this morning.  Feels better after that.  Renal function improving.  Assessment/Plan: AKI on CKD 3  -Likely due to hepatorenal syndrome.   -Given IV albumin 25 mg every 6hrs for 48 hours -Currently medically stable. 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  Patient is trying to get in their transplant list. -Given 25 g of IV albumin infusion every 6 hours.   -Continue lactulose and rifaximin. Aldactone on hold. -Continue to monitor blood pressure. -On Bactrim prophylaxis for SBP.  Noted a plan to switch to a different agent because of renal dysfunction. -GI and nephrology consult appreciated.  Acute on chronic hyponatremia -Likely hypervolemic hyponatremia asymptomatic -Continue fluid restriction 1200 mL daily -Continue to monitor Recent Labs  Lab  11/14/19 1303 11/15/19 0835 11/16/19 0905 11/17/19 0907  NA 123* 126* 124* 128*   Hypokalemia -Potassium level improved after replacement. Recent Labs  Lab 11/14/19 1303 11/15/19 0835 11/16/19 0905 11/17/19 0907  K 3.3* 3.6 3.1* 3.8  MG  --  2.3 2.4  --    Chronic blood loss anemia -Currently not bleeding.  Continue to monitor hemoglobin level. Recent Labs    05/15/19 1516 06/18/19 1619 06/19/19 0520 06/19/19 0815 06/20/19 0531 06/21/19 0729 07/25/19 1608 07/27/19 0550 07/28/19 0553 08/14/19 1440 08/15/19 0628 10/02/19 1450 11/14/19 1303 11/15/19 0835 11/16/19 0905 11/17/19 0907  HGB  --    < > 9.4*  --    < > 10.4* 8.9* 7.9* 7.6* 9.4* 8.2*  --  9.7* 8.6* 8.0* 7.6*  VITAMINB12  --   --  917*  --   --   --   --   --   --   --   --   --   --   --   --   --   FOLATE  --   --  7.3  --   --   --   --   --   --   --   --   --   --   --   --   --   FERRITIN 541*  --   --  601*  --   --   --   --   --   --   --  415*  --   --   --   --   TIBC 199*  --   --  124*  --   --   --   --   --   --   --  171*  --   --   --   --   IRON 85  --   --  96  --   --   --   --   --   --   --  79  --   --   --   --    < > = values in this interval not displayed.    History of esophageal varices and portal gastric hypertension related to above -Continue Protonix twice daily  Mobility: Encourage ambulation Code Status:   Code Status: Full Code  Nutritional status: Body mass index is 26.04 kg/m.     Diet Order            Diet Heart Room service appropriate? Yes; Fluid consistency: Thin; Fluid restriction: 1200 mL Fluid  Diet effective now                 DVT prophylaxis: heparin injection 5,000 Units Start: 11/14/19 1600   Antimicrobials:  Bactrim Fluid: None Consultants: Nephrology Family Communication:  Significant other at bedside  Status is: Inpatient  Remains inpatient appropriate because pending paracentesis tomorrow  Dispo: The patient is from: Home               Anticipated d/c is to: Home              Anticipated d/c date is: Tomorrow if renal function stays good              Patient currently is not medically stable to d/c.       Infusions:  . sodium chloride 250 mL (11/17/19 0756)    Scheduled Meds: . heparin  5,000 Units Subcutaneous Q8H  . lactulose  30 g Oral BID  . pantoprazole  40 mg Oral BID AC  . rifaximin  550 mg Oral BID  . sodium chloride flush  3 mL Intravenous Q12H    Antimicrobials: Anti-infectives (From admission, onward)   Start     Dose/Rate Route Frequency Ordered Stop   11/14/19 1600  rifaximin (XIFAXAN) tablet 550 mg        550 mg Oral 2 times daily 11/14/19 1524     11/14/19 1600  sulfamethoxazole-trimethoprim (BACTRIM DS) 800-160 MG per tablet 1 tablet  Status:  Discontinued       Note to Pharmacy: To prevent infection in your abdominal fluid.     1 tablet Oral Daily 11/14/19 1524 11/17/19 1018      PRN meds: sodium chloride, acetaminophen **OR** acetaminophen, baclofen, ondansetron **OR** ondansetron (ZOFRAN) IV, sodium chloride flush   Objective: Vitals:   11/17/19 1101 11/17/19 1330  BP: 104/72 99/67  Pulse: 100 98  Resp: 18 17  Temp:  98 F (36.7 C)  SpO2: 98% 100%    Intake/Output Summary (Last 24 hours) at 11/17/2019 1448 Last data filed at 11/17/2019 0900 Gross per 24 hour  Intake 120 ml  Output 575 ml  Net -455 ml   Filed Weights   11/14/19 1239 11/16/19 0500 11/17/19 0501  Weight: 86.6 kg 84.7 kg 84.7 kg   Weight change: 0.013 kg Body mass index is 26.04 kg/m.   Physical Exam: General exam: Appears calm and comfortable.  Feels better after surgery staffing. Skin: No rashes, lesions or ulcers. HEENT: Atraumatic, normocephalic, supple neck, no obvious bleeding Lungs: Clear to auscultation bilaterally CVS: Regular rate and rhythm, no murmur GI/Abd soft, nontender, still remains distended.  Adequate fluid could not be tapped.   CNS: Alert, awake, oriented  x3 Psychiatry:  Depressed look Extremities: No pedal edema, no calf tenderness  Data Review: I have personally reviewed the laboratory data and studies available.  Recent Labs  Lab 11/14/19 1303 11/15/19 0835 11/16/19 0905 11/17/19 0907  WBC 7.9 5.8 5.0 4.6  NEUTROABS 5.2  --  2.8 2.8  HGB 9.7* 8.6* 8.0* 7.6*  HCT 27.8* 24.1* 22.8* 22.4*  MCV 98.2 97.6 99.1 99.6  PLT 162 142* 127* 111*   Recent Labs  Lab 11/14/19 1303 11/15/19 0835 11/16/19 0905 11/17/19 0907  NA 123* 126* 124* 128*  K 3.3* 3.6 3.1* 3.8  CL 90* 91* 91* 93*  CO2 21* 26 23 23   GLUCOSE 114* 129* 123* 95  BUN 40* 38* 35* 30*  CREATININE 2.24* 2.07* 1.94* 1.78*  CALCIUM 8.5* 8.7* 8.7* 9.2  MG  --  2.3 2.4  --     Signed, , MD [facility] 11/17/2019

## 2019-11-18 ENCOUNTER — Other Ambulatory Visit: Payer: Self-pay

## 2019-11-18 ENCOUNTER — Telehealth: Payer: Self-pay | Admitting: Gastroenterology

## 2019-11-18 DIAGNOSIS — K7460 Unspecified cirrhosis of liver: Secondary | ICD-10-CM

## 2019-11-18 DIAGNOSIS — K7031 Alcoholic cirrhosis of liver with ascites: Secondary | ICD-10-CM

## 2019-11-18 DIAGNOSIS — Z79899 Other long term (current) drug therapy: Secondary | ICD-10-CM

## 2019-11-18 DIAGNOSIS — D649 Anemia, unspecified: Secondary | ICD-10-CM

## 2019-11-18 LAB — BASIC METABOLIC PANEL
Anion gap: 10 (ref 5–15)
BUN: 25 mg/dL — ABNORMAL HIGH (ref 6–20)
CO2: 25 mmol/L (ref 22–32)
Calcium: 9.1 mg/dL (ref 8.9–10.3)
Chloride: 96 mmol/L — ABNORMAL LOW (ref 98–111)
Creatinine, Ser: 1.64 mg/dL — ABNORMAL HIGH (ref 0.61–1.24)
GFR calc Af Amer: 54 mL/min — ABNORMAL LOW (ref >60)
GFR calc non Af Amer: 46 mL/min — ABNORMAL LOW (ref >60)
Glucose, Bld: 99 mg/dL (ref 70–99)
Potassium: 3.7 mmol/L (ref 3.5–5.1)
Sodium: 131 mmol/L — ABNORMAL LOW (ref 135–145)

## 2019-11-18 LAB — CBC WITH DIFFERENTIAL/PLATELET
Abs Immature Granulocytes: 0.01 10*3/uL (ref 0.00–0.07)
Basophils Absolute: 0 10*3/uL (ref 0.0–0.1)
Basophils Relative: 0 %
Eosinophils Absolute: 0.1 10*3/uL (ref 0.0–0.5)
Eosinophils Relative: 2 %
HCT: 23.3 % — ABNORMAL LOW (ref 39.0–52.0)
Hemoglobin: 7.9 g/dL — ABNORMAL LOW (ref 13.0–17.0)
Immature Granulocytes: 0 %
Lymphocytes Relative: 16 %
Lymphs Abs: 0.9 10*3/uL (ref 0.7–4.0)
MCH: 33.8 pg (ref 26.0–34.0)
MCHC: 33.9 g/dL (ref 30.0–36.0)
MCV: 99.6 fL (ref 80.0–100.0)
Monocytes Absolute: 0.8 10*3/uL (ref 0.1–1.0)
Monocytes Relative: 14 %
Neutro Abs: 3.6 10*3/uL (ref 1.7–7.7)
Neutrophils Relative %: 68 %
Platelets: 105 10*3/uL — ABNORMAL LOW (ref 150–400)
RBC: 2.34 MIL/uL — ABNORMAL LOW (ref 4.22–5.81)
RDW: 13.8 % (ref 11.5–15.5)
WBC: 5.4 10*3/uL (ref 4.0–10.5)
nRBC: 0 % (ref 0.0–0.2)

## 2019-11-18 NOTE — Telephone Encounter (Signed)
Noted. Lab order placed and faxed to [facility].

## 2019-11-18 NOTE — Addendum Note (Signed)
Addended by: Armstead Peaks on: 11/18/2019 01:04 PM   Modules accepted: Orders

## 2019-11-18 NOTE — Progress Notes (Signed)
D/c instructions given. Pt and SO verbalized understanding of instructions given. Condition sable at d/c No changes in initial AM assessment at this time

## 2019-11-18 NOTE — Telephone Encounter (Signed)
Standing orders updated.

## 2019-11-18 NOTE — Telephone Encounter (Signed)
Alan Phillips: Patient is being discharged from the hospital today.  He will need repeat CBC and CMP in 1 week.  Please arrange.  [facility] Clinical Pool: Patient needs updated standing para orders.  Max volume removal will now be 4L per nephrology.  He will also need to receive 25 g of IV albumin. Please discontinue prior orders.

## 2019-11-18 NOTE — Progress Notes (Signed)
Subjective: Feeling well.  Looking forward to going home today.  Abdomen is still distended but does not feel quite as tense as it normally does.  States he was looking to have another pair on Thursday or Friday this week.  He has had 1 bowel movement this morning with no BRBPR or melena.  1 episode of vomiting yesterday after lactulose without hematemesis.  No current abdominal pain.  No current nausea.  Objective: Vital signs in last 24 hours: Temp:  [98 F (36.7 C)-99.3 F (37.4 C)] 98 F (36.7 C) (09/14 0504) Pulse Rate:  [98-105] 98 (09/14 0504) Resp:  [17-20] 20 (09/14 0504) BP: (99-103)/(64-72) 103/72 (09/14 0504) SpO2:  [96 %-100 %] 100 % (09/14 0504) Weight:  [85.1 kg] 85.1 kg (09/14 0504) Last BM Date: 11/17/19 General: Alert and oriented, pleasant, thin/cachectic, chronically ill-appearing. Head:  Normocephalic and atraumatic. Eyes:  No icterus, sclera clear. Conjuctiva pink.  Abdomen:  Bowel sounds present.  Abdomen continues to be quite distended mildly tense with ascites.  He has protruding umbilical hernia with thin overlying skin.  No tenderness palpation. No rebound or guarding. No masses appreciated.  Extremities: 2+ bilateral lower extremity pitting edema.  Per patient, this is improved since admission. Neurologic:  Alert and  oriented x4;  grossly normal neurologically. Skin:  Warm and dry, intact without significant lesions.  Psych:  Normal mood and affect.  Intake/Output from previous day: 09/13 0701 - 09/14 0700 In: 361.8 [P.O.:360; I.V.:1.8] Out: 650 [Urine:650] Intake/Output this shift: No intake/output data recorded.  Lab Results: Recent Labs    11/16/19 0905 11/17/19 0907 11/18/19 0601  WBC 5.0 4.6 5.4  HGB 8.0* 7.6* 7.9*  HCT 22.8* 22.4* 23.3*  PLT 127* 111* 105*   BMET Recent Labs    11/16/19 0905 11/17/19 0907 11/18/19 0601  NA 124* 128* 131*  K 3.1* 3.8 3.7  CL 91* 93* 96*  CO2 23 23 25   GLUCOSE 123* 95 99  BUN 35* 30* 25*   CREATININE 1.94* 1.78* 1.64*  CALCIUM 8.7* 9.2 9.1   LFT Recent Labs    11/17/19 1009  PROT 5.9*  ALBUMIN 4.2  AST 32  ALT 16  ALKPHOS 53  BILITOT 3.4*  BILIDIR 0.6*  IBILI 2.8*   PT/INR Recent Labs    11/17/19 1009  LABPROT 20.2*  INR 1.8*    Studies/Results: 11/19/19 Paracentesis  Result Date: 11/17/2019 INDICATION: Ascites EXAM: ULTRASOUND GUIDED left PARACENTESIS MEDICATIONS: None. COMPLICATIONS: None immediate. PROCEDURE: Informed written consent was obtained from the patient after a discussion of the risks (including hemorrhage and infection, among others), benefits and alternatives to treatment. A timeout was performed prior to the initiation of the procedure. Initial ultrasound scanning demonstrates a large amount of ascites within the left lower abdominal quadrant. The left lower abdomen was prepped and draped in the usual sterile fashion. 1% lidocaine was used for local anesthesia. Following this, a Yueh catheter was introduced. An ultrasound image was saved for documentation purposes. The paracentesis was performed. The catheter was removed and a dressing was applied. The patient tolerated the procedure well without immediate post procedural complication. FINDINGS: A total of approximately 3.5 L of straw-colored fluid was removed. IMPRESSION: Successful ultrasound-guided paracentesis yielding 3.5 liters of peritoneal fluid. Electronically Signed   By: 11/19/2019 M.D.   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   Admitted with acute on chronic kidney disease likely secondary to hepatorenal syndrome Type 2, with improvement in renal function since admission. Completing  last round of Albumin IV 11/17/2019 per nephrology.  Nephrology is continuing to manage and will have close follow-up with patient in the outpatient setting.  Tense ascites: Historically had been undergoing large-volume paracentesis.  Per nephrology, recommended removing no more than 4 L at a time and to receive 25 g of IV albumin at the time of para.  He had para yesterday removing 3.5 L.  I will plan to contact our clinical pool to adjust his standing orders to ensure max volume removal is 4 L and that there are orders for albumin as well.  SBP prophylaxis: Patient has been on Bactrim for prophylaxis.  Nephrology has recommended changing this regimen due to renal disease.  Considered ciprofloxacin, but he has history of QT prolongation.  Repeat EKG today and QT was not prolonged; however, with his fragile disease state, I am concerned about electrolyte derangements in the future with potential QT prolongation in the outpatient setting.  I spoke with Dr. Daiva Eves with ID who states rifaximin 550 mg twice daily is a reasonable option.  Patient is currently on this medication for hepatic encephalopathy and we will continue this medication for SBP prophylaxis as well.  Anemia: History of upper GI bleed in April 2021 likely secondary to [facility] erosions and portal gastropathy s/p EGD with placement of 2 clips.  He is maintained on Protonix 40 mg twice daily and has not had any overt GI bleeding.  Hemoglobin has declined during his hospitalization.  Hemoglobin 9.7> 8.6> 8.0> 7.6> 7.9.  Patient denies any overt GI bleeding.  Suspect this may be secondary to volume expansion in setting of albumin runs for HRS. Recommend continuing to monitor.   He will need to have close follow-up with [facility] as already planned.  He will also continue to follow closely in our office.  He already has an appointment scheduled for 9/27.  Plan: 1.  Patient is being discharged home today. 2.  Continue to follow closely with nephrology  as an outpatient. 3.  Stop Bactrim.   4.  Continue Xifaxan 550 mg twice daily which will also be used for SBP prophylaxis. 5.  Continue lactulose with goal of 3-4 BMs daily. 6.  Will contact office to update standing para orders for maximum of 4 L to be removed and for patient to receive 25 g of albumin. 7.  Continue PPI BID 8 monitor for bright red blood per rectum or melena. 9.  Keep upcoming office visit with [facility] in October 2021. 10 keep upcoming office visit at our office on 9/27. 11.  Update CBC and CMP in 1 week. Our office will arrange.    LOS: 4 days    11/18/2019, 11:55 AM   Ermalinda Memos, PA-C [facility]

## 2019-11-18 NOTE — Progress Notes (Signed)
[facility] Progress Note    Assessment/ Plan:   1.  AKI on CKD 3: likely d/t hepatorenal syndrome Type 2.  Improved with intravenous albumin.  Back towards baseline.  Will need to resume diuretics, would let him follow-up as an outpatient for another blood test first.  No further inpatient ingestions, [place] in [place].  2.  HRS #2: as above, resolved  3.  ESLD 2/2 EtOH cirrhosis: recently seen at [facility].  He usually gets LVPs. Agree with decreasing the amount of fluid removed to no more than 4 L on Monday.  Would give 25 g IV albumin beforehand too.    GI to transition from Bactrim to fluoroquinolone for SBP prophylaxis  4.  Hepatic encephalopathy: on Lactulose and rifaximin  5.  Muscle spasms: would try to avoid baclofen as this is totally renally excreted.    6.  Hyponatremia: hypervolemic hyponatremia 2/2 #3, cont fluid restriction.  Improved  Subjective:    Seen in room.   3.5 L paracentesis yesterday Creatinine improved to 1.6, near previous baseline, K3.7 Off TMP/SMX 0.7L UOP yesterday documented   Objective:   BP 103/72 (BP Location: Left Arm)   Pulse 98   Temp 98 F (36.7 C) (Oral)   Resp 20   Ht 5\' 11"  (1.803 m)   Wt 85.1 kg   SpO2 100%   BMI 26.17 kg/m   Intake/Output Summary (Last 24 hours) at 11/18/2019 0950 Last data filed at 11/18/2019 0327 Gross per 24 hour  Intake 241.75 ml  Output 450 ml  Net -208.25 ml   Weight change: 0.4 kg  Physical Exam: GEN NAD, lying in bed HEENT + temporal wasting NECK no JVD PULM clear CV RRR ABD protuberant, + ascites, + umbilical hernia  EXT 2+ LE edema NEURO AAO x3, some mild cogitive slowing with occasional asterixis SKIN + Jaundice MSK: sarcopenia  Imaging: 11/20/2019 Paracentesis  Result Date: 11/17/2019 INDICATION: Ascites EXAM: ULTRASOUND GUIDED left PARACENTESIS MEDICATIONS: None. COMPLICATIONS: None immediate. PROCEDURE: Informed written  consent was obtained from the patient after a discussion of the risks (including hemorrhage and infection, among others), benefits and alternatives to treatment. A timeout was performed prior to the initiation of the procedure. Initial ultrasound scanning demonstrates a large amount of ascites within the left lower abdominal quadrant. The left lower abdomen was prepped and draped in the usual sterile fashion. 1% lidocaine was used for local anesthesia. Following this, a Yueh catheter was introduced. An ultrasound image was saved for documentation purposes. The paracentesis was performed. The catheter was removed and a dressing was applied. The patient tolerated the procedure well without immediate post procedural complication. FINDINGS: A total of approximately 3.5 L of straw-colored fluid was removed. IMPRESSION: Successful ultrasound-guided paracentesis yielding 3.5 liters of peritoneal fluid. Electronically Signed   By: 11/19/2019 M.D.   On: 11/17/2019 12:12    Labs: BMET Recent Labs  Lab 11/14/19 1303 11/15/19 0835 11/16/19 0905 11/17/19 0907 11/18/19 0601  NA 123* 126* 124* 128* 131*  K 3.3* 3.6 3.1* 3.8 3.7  CL 90* 91* 91* 93* 96*  CO2 21* 26 23 23 25   GLUCOSE 114* 129* 123* 95 99  BUN 40* 38* 35* 30* 25*  CREATININE 2.24* 2.07* 1.94* 1.78* 1.64*  CALCIUM 8.5* 8.7* 8.7* 9.2 9.1   CBC Recent Labs  Lab 11/14/19 1303 11/14/19 1303 11/15/19 0835 11/16/19 0905 11/17/19 0907 11/18/19 0601  WBC 7.9   < > 5.8 5.0  4.6 5.4  NEUTROABS 5.2  --   --  2.8 2.8 3.6  HGB 9.7*   < > 8.6* 8.0* 7.6* 7.9*  HCT 27.8*   < > 24.1* 22.8* 22.4* 23.3*  MCV 98.2   < > 97.6 99.1 99.6 99.6  PLT 162   < > 142* 127* 111* 105*   < > = values in this interval not displayed.    Medications:    . heparin  5,000 Units Subcutaneous Q8H  . lactulose  30 g Oral BID  . pantoprazole  40 mg Oral BID AC  . rifaximin  550 mg Oral BID  . sodium chloride flush  3 mL Intravenous Q12H      Arita Miss  MD 11/18/2019, 9:50 AM

## 2019-11-18 NOTE — Discharge Summary (Signed)
Physician Discharge Summary  Alan Phillips ZOX:[id number] DOB: 1965/12/01 DOA: 11/14/2019  PCP: Patient, No Pcp Per  Admit date: 11/14/2019 Discharge date: 11/18/2019  Admitted From: Home Discharge disposition: Home   Code Status: Full Code  Diet Recommendation: Low-salt diet  Discharge Diagnosis:   Active Problems:   AKI (acute kidney injury) (HCC)  History of Present Illness / Brief narrative:  Alan Phillips is a 54 y.o. male with PMH of prior alcohol abuse, alcoholic liver cirrhosis, esophageal varices, portal gastropathy, recurrent ascites requiring paracentesis. Patient was sent to the ED on 11/14/2019 for elevated creatinine 9/8.  In the ED, patient was slightly tachycardic.  Lab work showed hemoglobin of 9.7, potassium 3.3, sodium 123, BUN/creatinine 40/2.24, ammonia level 69. He was given a dose of IV albumin. Patient was admitted to hospitalist service for further evaluation management.  Subjective:  Seen and examined this morning.  Walking around the room.  Feels better.  Renal function improving.  Hospital Course:  AKI on CKD 3  -Likely due to hepatorenal syndrome.   -Patient was seen by nephrology, GI.  He was given IV albumin 25 mg every 6hrs for 48 hours prior to paracentesis. -9/13, patient underwent paracentesis with removal of 3.5 L of ascites fluid. -Feels better.  Renal function back to his baseline. -Per nephrology this morning, patient will follow up with nephrology as an outpatient later this week.  His renal function will be checked at that time and he will be reinitiated on diuretics if needed.  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  Patient is trying to get in their transplant list. -Given 25 g of IV albumin infusion every 6 hours.   -Continue lactulose and rifaximin. Aldactone will remain on hold. -Continue to monitor blood pressure at home. -Prior to admission, patient was on Bactrim prophylaxis for SBP. Because of risk of renal impairment, Bactrim has been stopped.  Per GI, patient will continue Xifaxan which will also work as SBP prophylaxis.  I confirmed with patient that he is taking Xifaxan with assistance from the company. -GI and nephrology consult appreciated.  Acute on chronic hyponatremia -Likely hypervolemic hyponatremia asymptomatic -Continue fluid restriction 1200 mL daily -Continue to monitor as an outpatient.  Recent Labs  Lab 11/14/19 1303 11/15/19 0835 11/16/19 0905 11/17/19 0907 11/18/19 0601  NA 123* 126* 124* 128* 131*   Hypokalemia -Potassium level improved after replacement. Recent Labs  Lab 11/14/19 1303 11/15/19 0835 11/16/19 0905 11/17/19 0907 11/18/19 0601  K 3.3* 3.6 3.1* 3.8 3.7  MG  --  2.3 2.4  --   --    Chronic blood loss anemia -Currently not bleeding.  Continue to monitor hemoglobin level. Recent Labs    05/15/19 1516 06/18/19 1619 06/19/19 0520 06/19/19 0815 06/20/19 0531 08/15/19 0628 10/02/19 1450 11/14/19 1303 11/14/19 1303 11/15/19 0835 11/15/19 0835 11/16/19 0905 11/16/19 0905 11/17/19 0907 11/18/19 0601  HGB  --    < > 9.4*  --    < >   < >  --  9.7*  --  8.6*  --  8.0*  --  7.6* 7.9*  MCV  --    < > 103.1*  --    < >   < >  --  98.2   < > 97.6   < > 99.1   < > 99.6 99.6  VITAMINB12  --   --  917*  --   --   --   --   --   --   --   --   --   --   --   --   FOLATE  --   --  7.3  --   --   --   --   --   --   --   --   --   --   --   --   FERRITIN 541*  --   --  601*  --   --  415*  --   --   --   --   --   --   --   --   TIBC 199*  --   --  124*  --   --  171*  --   --   --   --   --   --   --   --   IRON 85   --   --  96  --   --  79  --   --   --   --   --   --   --   --    < > = values in this interval not displayed.   History of esophageal varices and portal gastric hypertension related to above -Continue Protonix twice daily  Stable for discharge home today.  Wound care:    Discharge Exam:   Vitals:   11/17/19 1101 11/17/19 1330 11/17/19 2038 11/18/19 0504  BP: 104/72 99/67 102/64 103/72  Pulse: 100 98 (!) 105 98  Resp: 18 17 20 20   Temp:  98 F (36.7 C) 99.3 F (37.4 C) 98 F (36.7 C)  TempSrc:  Oral Oral Oral  SpO2: 98% 100% 96% 100%  Weight:    85.1 kg  Height:        Body mass index is 26.17 kg/m.  General exam: Appears calm and comfortable.  Not in physical distress Skin: No rashes, lesions or ulcers. HEENT: Atraumatic, normocephalic, supple neck, no obvious bleeding Lungs: Clear to auscultation bilaterally CVS: Regular rate and rhythm, no murmur GI/Abd distended from ascites, nontender CNS: Alert, awake, oriented x3 Psychiatry: Mood appropriate Extremities: No pedal edema, no calf tenderness  Follow ups:   Discharge Instructions    Diet - low sodium heart healthy   Complete by: As directed    Increase activity slowly   Complete by: As directed       Follow-up Information    [facility] Follow up.   Contact information: [address] [place] [id number] [phone]       Arita MissSanford, Ryan B, MD Follow up.   Specialty: Nephrology Contact information: [facility] [phone]        Lanelle Balarver, Charles K, DO Follow up.   Specialty: Internal Medicine Contact information: [address] [place] [id number] [phone]               Recommendations for Outpatient Follow-Up:   1. Follow-up with nephrology as an outpatient 2. Follow-up with GI as an outpatient  Discharge Instructions:  Follow with Primary MD Patient, No Pcp Per in 7 days   Get CBC/CMP checked in next visit  within 1 week by PCP or SNF MD ( we routinely change or add medications  that can affect your baseline labs and fluid status, therefore we recommend that you get the mentioned basic workup next visit with your PCP, your PCP may decide not to get them or add new tests based on their clinical decision)  On your next visit with your PCP, please Get Medicines reviewed and adjusted.  Please request your PCP  to go over all Hospital Tests and Procedure/Radiological results at the follow up, please get all Hospital records sent to your Prim MD by signing hospital release before you go home.  Activity: As tolerated with Full fall precautions use walker/cane & assistance as needed  For Heart failure patients - Check your Weight same time everyday, if you gain over 2 pounds, or you develop in leg swelling, experience more shortness of breath or chest pain, call your Primary MD immediately. Follow Cardiac Low Salt Diet and 1.5 lit/day fluid restriction.  If you have smoked or chewed Tobacco in the last 2 yrs please stop smoking, stop any regular Alcohol  and or any Recreational drug use.  If you experience worsening of your admission symptoms, develop shortness of breath, life threatening emergency, suicidal or homicidal thoughts you must seek medical attention immediately by calling 911 or calling your MD immediately  if symptoms less severe.  You Must read complete instructions/literature along with all the possible adverse reactions/side effects for all the Medicines you take and that have been prescribed to you. Take any new Medicines after you have completely understood and accpet all the possible adverse reactions/side effects.   Do not drive, operate heavy machinery, perform activities at heights, swimming or participation in water activities or provide baby sitting services if your were admitted for syncope or siezures until you have seen by Primary MD or a Neurologist and advised to do so again.  Do  not drive when taking Pain medications.  Do not take more than prescribed Pain, Sleep and Anxiety Medications  Wear Seat belts while driving.   Please note You were cared for by a hospitalist during your hospital stay. If you have any questions about your discharge medications or the care you received while you were in the hospital after you are discharged, you can call the unit and asked to speak with the hospitalist on call if the hospitalist that took care of you is not available. Once you are discharged, your primary care physician will handle any further medical issues. Please note that NO REFILLS for any discharge medications will be authorized once you are discharged, as it is imperative that you return to your primary care physician (or establish a relationship with a primary care physician if you do not have one) for your aftercare needs so that they can reassess your need for medications and monitor your lab values.    Allergies as of 11/18/2019      Reactions   Codeine Hives, Itching, Swelling   Other reaction(s): Dizziness      Medication List    STOP taking these medications   furosemide 20 MG tablet Commonly known as: Lasix   spironolactone 100 MG tablet Commonly known as: Aldactone   sulfamethoxazole-trimethoprim 800-160 MG tablet Commonly known as: Bactrim DS     TAKE these medications   baclofen 10 MG tablet Commonly known as: LIORESAL TAKE 1 TABLET BY MOUTH THREE TIMES DAILY What changed:   how much to take  how to take this  when to take this  reasons to take this   Constulose 10 GM/15ML solution Generic  drug: lactulose take 30 mls BY MOUTH in THE morning AND AT BEDTIME What changed: See the new instructions.   ondansetron 4 MG tablet Commonly known as: ZOFRAN TAKE 1 TABLET BY MOUTH EVERY 8 HOURS AS NEEDED FOR NAUSEA AND VOMITING What changed: See the new instructions.   pantoprazole 40 MG tablet Commonly known as: Protonix Take 1 tablet (40 mg  total) by mouth 2 (two) times daily before a meal.   rifaximin 550 MG Tabs tablet Commonly known as: XIFAXAN Take 1 tablet (550 mg total) by mouth 2 (two) times daily.       Time coordinating discharge: 35 minutes  The results of significant diagnostics from this hospitalization (including imaging, microbiology, ancillary and laboratory) are listed below for reference.    Procedures and Diagnostic Studies:   US RENAL  Result Date: 11/14/2019 CLINICAL DATA:  Acute kidney injury. EXAM: RENAL / URINARY TRACT ULTRASOUND COMPLETE COMPARISON:  None. FINDINGS: Right Kidney: Renal measurements: 10.8 x 3.8 x 4.9 cm = volume: 166 mL. Echogenicity within normal limits. No mass or hydronephrosis visualized. Left Kidney: Renal measurements: 11.8 x 4.5 x 3.8 cm = volume: 188 mL. Echogenicity within normal limits. No mass or hydronephrosis visualized. Bladder: Only minimally distended. No obvious focal abnormality. Neither ureteral jet is seen. Other: Ascites is seen on prior exams. IMPRESSION: 1. Unremarkable sonographic appearance of the kidneys. 2. Minimally distended urinary bladder, not well assessed on the current exam. Neither ureteral jet is seen. Electronically Signed   By: Narda Rutherford M.D.   On: 11/14/2019 16:19     Labs:   Basic Metabolic Panel: Recent Labs  Lab 11/14/19 1303 11/14/19 1303 11/15/19 0835 11/15/19 0835 11/16/19 0905 11/16/19 0905 11/17/19 0907 11/18/19 0601  NA 123*  --  126*  --  124*  --  128* 131*  K 3.3*   < > 3.6   < > 3.1*   < > 3.8 3.7  CL 90*  --  91*  --  91*  --  93* 96*  CO2 21*  --  26  --  23  --  23 25  GLUCOSE 114*  --  129*  --  123*  --  95 99  BUN 40*  --  38*  --  35*  --  30* 25*  CREATININE 2.24*  --  2.07*  --  1.94*  --  1.78* 1.64*  CALCIUM 8.5*  --  8.7*  --  8.7*  --  9.2 9.1  MG  --   --  2.3  --  2.4  --   --   --    < > = values in this interval not displayed.   GFR Estimated Creatinine Clearance: 54.2 mL/min (A) (by C-G  formula based on SCr of 1.64 mg/dL (H)). Liver Function Tests: Recent Labs  Lab 11/14/19 1303 11/15/19 0835 11/17/19 1009  AST 62* 50* 32  ALT 25 22 16   ALKPHOS 84 68 53  BILITOT 2.5* 3.0* 3.4*  PROT 6.5 5.9* 5.9*  ALBUMIN 3.4* 3.3* 4.2   No results for input(s): LIPASE, AMYLASE in the last 168 hours. Recent Labs  Lab 11/14/19 1305  AMMONIA 69*   Coagulation profile Recent Labs  Lab 11/14/19 1303 11/15/19 0835 11/17/19 1009  INR 1.3* 1.5* 1.8*    CBC: Recent Labs  Lab 11/14/19 1303 11/15/19 0835 11/16/19 0905 11/17/19 0907 11/18/19 0601  WBC 7.9 5.8 5.0 4.6 5.4  NEUTROABS 5.2  --  2.8 2.8 3.6  HGB  9.7* 8.6* 8.0* 7.6* 7.9*  HCT 27.8* 24.1* 22.8* 22.4* 23.3*  MCV 98.2 97.6 99.1 99.6 99.6  PLT 162 142* 127* 111* 105*   Cardiac Enzymes: No results for input(s): CKTOTAL, CKMB, CKMBINDEX, TROPONINI in the last 168 hours. BNP: Invalid input(s): POCBNP CBG: No results for input(s): GLUCAP in the last 168 hours. D-Dimer No results for input(s): DDIMER in the last 72 hours. Hgb A1c No results for input(s): HGBA1C in the last 72 hours. Lipid Profile No results for input(s): CHOL, HDL, LDLCALC, TRIG, CHOLHDL, LDLDIRECT in the last 72 hours. Thyroid function studies No results for input(s): TSH, T4TOTAL, T3FREE, THYROIDAB in the last 72 hours.  Invalid input(s): FREET3 Anemia work up No results for input(s): VITAMINB12, FOLATE, FERRITIN, TIBC, IRON, RETICCTPCT in the last 72 hours. Microbiology Recent Results (from the past 240 hour(s))  SARS Coronavirus 2 by RT PCR (hospital order, performed in [facility] hospital lab) Nasopharyngeal Nasopharyngeal Swab     Status: None   Collection Time: 11/14/19  2:18 PM   Specimen: Nasopharyngeal Swab  Result Value Ref Range Status   SARS Coronavirus 2 NEGATIVE NEGATIVE Final    Comment: (NOTE) SARS-CoV-2 target nucleic acids are NOT DETECTED.  The SARS-CoV-2 RNA is generally detectable in upper and lower respiratory  specimens during the acute phase of infection. The lowest concentration of SARS-CoV-2 viral copies this assay can detect is 250 copies / mL. A negative result does not preclude SARS-CoV-2 infection and should not be used as the sole basis for treatment or other patient management decisions.  A negative result may occur with improper specimen collection / handling, submission of specimen other than nasopharyngeal swab, presence of viral mutation(s) within the areas targeted by this assay, and inadequate number of viral copies (<250 copies / mL). A negative result must be combined with clinical observations, patient history, and epidemiological information.  Fact Sheet for Patients:   BoilerBrush.com.cy  Fact Sheet for Healthcare Providers: https://pope.com/  This test is not yet approved or  cleared by the [place] FDA and has been authorized for detection and/or diagnosis of SARS-CoV-2 by FDA under an Emergency Use Authorization (EUA).  This EUA will remain in effect (meaning this test can be used) for the duration of the COVID-19 declaration under Section 564(b)(1) of the Act, 21 U.S.C. section 360bbb-3(b)(1), unless the authorization is terminated or revoked sooner.  Performed at [facility], [address], [place], [place] [address]      Signed: Lorin Glass  [facility] 11/18/2019, 12:27 PM

## 2019-11-19 ENCOUNTER — Other Ambulatory Visit: Payer: Self-pay

## 2019-11-19 ENCOUNTER — Other Ambulatory Visit: Payer: Self-pay | Admitting: Gastroenterology

## 2019-11-19 ENCOUNTER — Telehealth: Payer: Self-pay

## 2019-11-19 DIAGNOSIS — K7460 Unspecified cirrhosis of liver: Secondary | ICD-10-CM

## 2019-11-19 DIAGNOSIS — R112 Nausea with vomiting, unspecified: Secondary | ICD-10-CM

## 2019-11-19 MED ORDER — ONDANSETRON 4 MG PO TBDP: 4.0000 mg | ORAL_TABLET | Freq: Three times a day (TID) | ORAL | 3 refills | Status: DC | PRN

## 2019-11-19 NOTE — Telephone Encounter (Signed)
Pts spouse wants to know if pt can has the sublingual nausea tab. Pt has some shaking in his hands x 1 week and pt would like to know is that from cirrhosis or the nausea? Pts spouse tried to set up pts Para's twice weekly. Pt was only able to schedule 1 appointment. Per spouse, she was told that our office needed to call and update the order to have para's twice weekly.

## 2019-11-19 NOTE — Telephone Encounter (Signed)
Spoke with pts spouse. She is aware that RX was sent into pts pharmacy. Pt will complete labs.

## 2019-11-19 NOTE — Telephone Encounter (Signed)
Standing order updated and faxed to central scheduling.

## 2019-11-19 NOTE — Telephone Encounter (Signed)
He was not encephalopathic when I saw him yesterday. We can check an ammonia level. Please arrange. I will send in sublingual zofran. We will see what we can do about the paras. He should also discuss this with his kidney doctor as well.   [facility] Clinical Pool: Can we arrange for paras to be completed up to twice weekly?

## 2019-11-21 ENCOUNTER — Other Ambulatory Visit: Payer: Self-pay

## 2019-11-21 ENCOUNTER — Ambulatory Visit (HOSPITAL_COMMUNITY)
Admission: RE | Admit: 2019-11-21 | Discharge: 2019-11-21 | Disposition: A | Discharge status: Home / Self Care | Payer: Self-pay | Source: Ambulatory Visit | Attending: Gastroenterology | Admitting: Gastroenterology

## 2019-11-21 DIAGNOSIS — R188 Other ascites: Secondary | ICD-10-CM | POA: Insufficient documentation

## 2019-11-21 LAB — BODY FLUID CELL COUNT WITH DIFFERENTIAL
Eos, Fluid: 0 %
Lymphs, Fluid: 67 %
Monocyte-Macrophage-Serous Fluid: 32 % — ABNORMAL LOW (ref 50–90)
Neutrophil Count, Fluid: 1 % (ref 0–25)
Total Nucleated Cell Count, Fluid: 43 cu mm (ref 0–1000)

## 2019-11-21 LAB — GRAM STAIN

## 2019-11-21 MED ORDER — ALBUMIN HUMAN 25 % IV SOLN: 50.0000 g | Freq: Once | INTRAVENOUS | Status: DC

## 2019-11-21 MED ORDER — ALBUMIN HUMAN 25 % IV SOLN: 25.0000 g | Freq: Once | INTRAVENOUS | Status: AC

## 2019-11-21 MED ORDER — ALBUMIN HUMAN 25 % IV SOLN
INTRAVENOUS | Status: AC
  Administered 2019-11-21: 25 g via INTRAVENOUS
  Filled 2019-11-21: qty 100

## 2019-11-21 NOTE — Procedures (Signed)
PreOperative Dx: Cirrhosis, ascites Postoperative Dx: Cirrhosis, ascites Procedure:   US guided paracentesis Radiologist:  Mariabella Nilsen Anesthesia:  10 ml of1% lidocaine Specimen:  4 L of cloudy yellow ascitic fluid EBL:   < 1 ml Complications:  None  

## 2019-11-21 NOTE — Progress Notes (Signed)
Paracentesis complete no signs of distress.  

## 2019-11-24 ENCOUNTER — Encounter (HOSPITAL_COMMUNITY): Payer: Self-pay

## 2019-11-24 ENCOUNTER — Ambulatory Visit (HOSPITAL_COMMUNITY)
Admission: RE | Admit: 2019-11-24 | Discharge: 2019-11-24 | Disposition: A | Discharge status: Home / Self Care | Payer: Self-pay | Source: Ambulatory Visit | Attending: Gastroenterology | Admitting: Gastroenterology

## 2019-11-24 ENCOUNTER — Other Ambulatory Visit: Payer: Self-pay

## 2019-11-24 DIAGNOSIS — R188 Other ascites: Secondary | ICD-10-CM | POA: Insufficient documentation

## 2019-11-24 LAB — BODY FLUID CELL COUNT WITH DIFFERENTIAL
Eos, Fluid: 1 %
Lymphs, Fluid: 71 %
Monocyte-Macrophage-Serous Fluid: 28 % — ABNORMAL LOW (ref 50–90)
Neutrophil Count, Fluid: 0 % (ref 0–25)
Total Nucleated Cell Count, Fluid: 67 cu mm (ref 0–1000)

## 2019-11-24 LAB — GRAM STAIN: Gram Stain: NONE SEEN

## 2019-11-24 MED ORDER — ALBUMIN HUMAN 25 % IV SOLN: 25.0000 g | Freq: Once | INTRAVENOUS | Status: AC

## 2019-11-24 MED ORDER — ALBUMIN HUMAN 25 % IV SOLN
INTRAVENOUS | Status: AC
  Administered 2019-11-24: 25 g via INTRAVENOUS
  Filled 2019-11-24: qty 100

## 2019-11-24 NOTE — Progress Notes (Signed)
Paracentesis complete no signs of distress.  

## 2019-11-25 LAB — PATHOLOGIST SMEAR REVIEW

## 2019-11-26 ENCOUNTER — Other Ambulatory Visit: Payer: Self-pay | Admitting: *Deleted

## 2019-11-26 ENCOUNTER — Telehealth: Payer: Self-pay

## 2019-11-26 ENCOUNTER — Telehealth: Payer: Self-pay | Admitting: Gastroenterology

## 2019-11-26 DIAGNOSIS — R188 Other ascites: Secondary | ICD-10-CM

## 2019-11-26 LAB — CULTURE, BODY FLUID W GRAM STAIN -BOTTLE: Culture: NO GROWTH

## 2019-11-26 NOTE — Telephone Encounter (Signed)
Noted  

## 2019-11-26 NOTE — Telephone Encounter (Signed)
Called central scheduling and PARA has been scheduled for 9/24 at 10:00am.  Called spouse and made aware of appt details.

## 2019-11-26 NOTE — Telephone Encounter (Signed)
VM received, pt is suppose to have two paras per week(Mondays, Fridays). Pt's spouse was told that pt is only able to call to schedule one and our office would have to schedule the other in order for pt to have two paras weekly. Pt would like to schedule the next para for this Friday 11/28/19 around 1-1:30 pm.

## 2019-11-26 NOTE — Telephone Encounter (Signed)
I spoke with pts spouse this morning and pt was having blood drawn today or tomorrow.

## 2019-11-26 NOTE — Telephone Encounter (Signed)
Alan Phillips, please remind patient and or his wife that he needs to have repeat labs this week. He has been getting paras twice weekly since hospital discharge and we really need to have his labs updated ASAP.

## 2019-11-28 ENCOUNTER — Ambulatory Visit (HOSPITAL_COMMUNITY): Admission: RE | Admit: 2019-11-28 | Payer: Self-pay | Source: Ambulatory Visit

## 2019-11-29 LAB — CULTURE, BODY FLUID W GRAM STAIN -BOTTLE: Culture: NO GROWTH

## 2019-11-30 NOTE — Progress Notes (Signed)
Referring Provider: Jacquelin Hawking, PA-C Primary Care Physician:  Patient, No Pcp Per Primary GI Physician: Dr. Jena Gauss  Chief Complaint  Patient presents with  . Cirrhosis    HPI:   Alan Phillips is a 54 y.o. male presenting today for follow-up.  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 No alcohol since February 2021. Found to have negative viral markers with no immunity to Hep A or B. History of hepatic encephalopathy well-managed on lactulose and Xifaxan, frequent paras due to ascites and inability to tolerate diuretic therapy, prior evaluation for TIPS 10/23/2019 with meld 25 which was too high for TIPS, SBP in May 2021. Upper GI bleed in April 2021 with EGD at that time revealing grade 1 esophageal varices, hiatal hernia with cameron erosions, and portal hypertensive gastropathy. No prior colonoscopy.  Imaging is up-to-date with no focal liver lesions on CT in August 2021.  Recently admitted 11/14/2019-11/18/2019 due to acute kidney injury likely secondary to hepatorenal syndrome type II.  Nephrology was consulted.  He received runs of IV albumin with improvement in renal function.  Creatinine was 2.24 on admission, improved to 1.64 on discharge.  Regarding ascites, nephrology recommended removing no more than 4 L at a time during paracentesis and patient should receive 25 g of IV albumin with H paracentesis.  Nephrology also recommended discontinuing Bactrim and choosing a different antibiotic regimen for SBP prophylaxis.  Due to QT prolongation, ciprofloxacin was not pursued.  Discussed with infectious disease who stated Xifaxan was an option for which he was already on for hepatic encephalopathy.    He was to have repeat CBC and CMP 1 week after discharge but this has not been completed. He has had paracentesis x2 since hospital discharge (9/17 and 9/20) removing 4 L.  No SBP.  Today: Missed para appt on Friday due to labs with  nephrology. Had labs completed at [facility]. Has follow up with nephrology this afternoon at 3 pm.   Primary concern is pressure in his abdomen with need of para. Zofran is working well for nausea. Taking once in the morning and before bedtime which is allowing him to sleep. He usually able to eat fairly well until the fluid builds up in his abdomen. Trying to drink 3 protein shakes daily.  Usually gets at least 2.  Swelling in his legs continue. Worsen as the day goes on. Compression stocking came in the mail today. Not taking any diuretics at this time.   Mental status is doing well. Taking Xifaxan and lactulose BID. Occasionally may forget what he is talking about. BMs 1-4 times daily.  Significant pressure on his abdomen secondary to fluid accumulation has decreased BMs.   No black stools. Reports hemorrhoids on the outside that he has had to 30+ years with associated toilet tissue hematochezia maybe twice a week.   No GERD symptoms.  When his abdomen is distended, he feels his foods get backed up and he will feel full into his throat. Has eat smaller portions and go back to finish his meals when this occurs. When fluids is drawn off, he does not have any trouble with foods going down normally.  We discussed the fact that he has never had a colonoscopy and is currently overdue for colon cancer screening.  Also with rectal bleeding, this would indicate need for colonoscopy.  Also with consideration of liver transplant, he likely needs colonoscopy.  Patient is not interested in scheduling colonoscopy at this time.  Past Medical History:  Diagnosis Date  . Cirrhosis of liver (HCC)   . Depression   . Esophageal varices (HCC)    grade 1 (EGD April 2021)  . Hepatic encephalopathy (HCC)    managed with Xifaxan and lactulose  . Psoriasis   . SBP (spontaneous bacterial peritonitis) (HCC) 07/2019  . Upper GI bleed    April 2021; secondary to careron lesions and portal gastropathy s/p placement of 2  clips    Past Surgical History:  Procedure Laterality Date  . BIOPSY  06/20/2019   Procedure: BIOPSY;  Surgeon: West Bali, MD;  Location: [facility];  Service: Endoscopy;;  gastric  . ESOPHAGEAL BANDING N/A 06/20/2019   Procedure: ESOPHAGEAL BANDING;  Surgeon: West Bali, MD;  Location: [facility];  Service: Endoscopy;  Laterality: N/A;  . ESOPHAGOGASTRODUODENOSCOPY (EGD) WITH PROPOFOL N/A 06/20/2019   Procedure: ESOPHAGOGASTRODUODENOSCOPY (EGD) WITH PROPOFOL;  Surgeon: West Bali, MD;  grade 1 esophageal varices, medium sized hiatal hernia, Cameron erosions, and portal gastropathy with some bleeding which was controlled with application of 2 clips. Bx: Neg H. pylori  . none      Current Outpatient Medications  Medication Sig Dispense Refill  . baclofen (LIORESAL) 10 MG tablet TAKE 1 TABLET BY MOUTH THREE TIMES DAILY (Patient taking differently: as needed. ) 90 tablet 2  . CONSTULOSE 10 GM/15ML solution take 30 mls BY MOUTH in THE morning AND AT BEDTIME (Patient taking differently: Take 20 g by mouth 2 (two) times daily. ) 1892 mL 3  . ondansetron (ZOFRAN) 4 MG tablet Take 4 mg by mouth every 8 (eight) hours as needed for nausea or vomiting.    . pantoprazole (PROTONIX) 40 MG tablet Take 1 tablet (40 mg total) by mouth 2 (two) times daily before a meal. 60 tablet 5  . rifaximin (XIFAXAN) 550 MG TABS tablet Take 1 tablet (550 mg total) by mouth 2 (two) times daily. 60 tablet 5   No current facility-administered medications for this visit.    Allergies as of 12/01/2019 - Review Complete 12/01/2019  Allergen Reaction Noted  . Codeine Hives, Itching, and Swelling 05/14/2019    Family History  Problem Relation Age of Onset  . Cirrhosis Paternal Uncle        etoh  . Cancer Paternal Uncle   . Alcohol abuse Paternal Uncle   . Alzheimer's disease Father   . Heart failure Maternal Uncle     Social History   Socioeconomic History  . Marital status: Married     Spouse name: Not on file  . Number of children: Not on file  . Years of education: Not on file  . Highest education level: Not on file  Occupational History  . Not on file  Tobacco Use  . Smoking status: Former Smoker    Packs/day: 0.50    Years: 42.00    Pack years: 21.00    Types: Cigarettes    Quit date: 12/05/2018    Years since quitting: 0.9  . Smokeless tobacco: Former Neurosurgeon    Quit date: 05/14/1986  . Tobacco comment: quit last year  Vaping Use  . Vaping Use: Never used  Substance and Sexual Activity  . Alcohol use: Not Currently    Comment: h/o longtime etoh abuse but quit 04/2019 when he found out he has cirrhosis (05/14/19)  . Drug use: Yes    Types: Marijuana    Comment: few times a week   . Sexual activity: Not on file  Other Topics Concern  . Not on file  Social History Narrative  . Not on file   Social Determinants of Health   Financial Resource Strain:   . Difficulty of Paying Living Expenses: Not on file  Food Insecurity:   . Worried About Programme researcher, broadcasting/film/video in the Last Year: Not on file  . Ran Out of Food in the Last Year: Not on file  Transportation Needs:   . Lack of Transportation (Medical): Not on file  . Lack of Transportation (Non-Medical): Not on file  Physical Activity:   . Days of Exercise per Week: Not on file  . Minutes of Exercise per Session: Not on file  Stress:   . Feeling of Stress : Not on file  Social Connections:   . Frequency of Communication with Friends and Family: Not on file  . Frequency of Social Gatherings with Friends and Family: Not on file  . Attends Religious Services: Not on file  . Active Member of Clubs or Organizations: Not on file  . Attends Banker Meetings: Not on file  . Marital Status: Not on file    Review of Systems: Gen: Denies fever, chills, cold or flulike symptoms, presyncope, syncope. CV: Denies chest pain or palpitations.  Resp: Has shortness of breath with exertion. No shortness of  breath at rest.  No cough. GI: See HPI Heme: See HPI  Physical Exam: BP 110/72   Pulse (!) 112   Temp 98.2 F (36.8 C) (Oral)   Ht 5\' 11"  (1.803 m)   Wt 183 lb (83 kg)   BMI 25.52 kg/m  General:   Alert and oriented. No distress noted. Pleasant and cooperative.  Head:  Normocephalic and atraumatic. Eyes: Slight scleral icterus. Heart:  S1, S2 present without murmurs appreciated. Lungs:  Clear to auscultation bilaterally. No wheezes, rales, or rhonchi. No distress.  Abdomen: Bowel sounds are distant and decreased likely secondary to significant ascites.  Abdomen is significantly distended and tight but nontender.  Protruding umbilical hernia with thin overlying skin. Msk:  Symmetrical without gross deformities. Normal posture. Extremities:  Without edema. Neurologic:  Alert and  oriented x4 Psych:  Normal mood and affect.

## 2019-12-01 ENCOUNTER — Encounter: Payer: Self-pay | Admitting: Gastroenterology

## 2019-12-01 ENCOUNTER — Telehealth: Payer: Self-pay | Admitting: Gastroenterology

## 2019-12-01 ENCOUNTER — Other Ambulatory Visit: Payer: Self-pay

## 2019-12-01 ENCOUNTER — Ambulatory Visit (INDEPENDENT_AMBULATORY_CARE_PROVIDER_SITE_OTHER): Payer: Self-pay | Admitting: Gastroenterology

## 2019-12-01 VITALS — BP 110/72 | HR 112 | Temp 98.2°F | Ht 71.0 in | Wt 183.0 lb

## 2019-12-01 DIAGNOSIS — R112 Nausea with vomiting, unspecified: Secondary | ICD-10-CM

## 2019-12-01 DIAGNOSIS — K7031 Alcoholic cirrhosis of liver with ascites: Secondary | ICD-10-CM

## 2019-12-01 DIAGNOSIS — D649 Anemia, unspecified: Secondary | ICD-10-CM

## 2019-12-01 DIAGNOSIS — K625 Hemorrhage of anus and rectum: Secondary | ICD-10-CM

## 2019-12-01 NOTE — Telephone Encounter (Signed)
Received staff message from Dr. Verna Czech, patient's nephrologist today regarding management of diuretics and paracentesis.  See message below for details.  Plans to resume diuretics.  Dr. Marisue Humble has also okayed resuming large-volume paracentesis with 6-8 L removal per session.  From: Arita Miss, MD Sent: 12/01/2019   3:24 PM EDT To: Letta Median, PA-C Subject: renal funciton, diuretics, LVPs                Good afternoon, Just finished seeing Alan Phillips  Creatinine has improved to 1.07, which is fantastic news  I think we can increase volume of LVP, I am okay with 6 to 8 L per session but he will need to receive albumin at least 25 g each time; he will hold diuretics the day of and after LVP.  I am resuming diuretics, Lasix/spironolactone 100/40 daily  He is hypokalemic and I am starting 20 mEq potassium chloride daily  I will repeat labs in about 2 weeks time  Follow-up with me in 2 months  Strong recommendation for him to receive COVID-19 vaccine   Mindy: We will need to adjust paracentesis orders again.  Per Dr. Marisue Humble, patient may resume having large volume paracentesis with 6-8 L removal each time. Recommended at least 25g of albumin with each para. Spoke with Erin Hearing in ultrasound who states patient will get 50g of albumin after 5L when ordering albumin per protocol.   Regarding albumin, can you do albumin per protocol, then add a comment to give 25g of albumin if less than 5L is removed.   He will still need the same labs as before with each para. Cell count, culture, and gram stain.   Patient is scheduled for a para tomorrow at 1pm. He will need updated orders for that appointment to ensure he gets adequate fluid removal.

## 2019-12-01 NOTE — Patient Instructions (Addendum)
I am requesting recent lab work.  We will call to let you know if we need any additional labs.  Keep follow-up with nephrology this afternoon.   Please discuss with nephrology whether or not you can have more than 4L of fluid drawn off you abdomen.  They can call our office to give an update regarding this matter.  You definitely need a paracentesis today or tomorrow.  Continue Xifaxan 550 mg twice daily.  Continue lactulose 30 mL twice daily.  The goal is to titrate this to have 3-4 BMs daily.  Continue Zofran as needed.  Continue Protonix 40 mg twice daily 30 minutes before breakfast and dinner.  Continue following a low-sodium diet.  No more than 2000 mg/day.  Continue drinking 3 protein shakes daily.  Be sure to use compression stockings to help with swelling in your lower extremities.  Monitor for black stools or increased bright red blood per rectum and let us know if this occurs.  Monitor for confusion or change in mental status and let us know if this occurs.  Please obtain hepatitis A/B vaccine when you are able.  We will plan to follow-up with you in 2 months.  Do not hesitate to call with questions or concerns prior.  Ermalinda Memos, PA-C [facility]

## 2019-12-02 ENCOUNTER — Encounter (HOSPITAL_COMMUNITY): Payer: Self-pay

## 2019-12-02 ENCOUNTER — Telehealth: Payer: Self-pay | Admitting: Gastroenterology

## 2019-12-02 ENCOUNTER — Telehealth: Payer: Self-pay | Admitting: Internal Medicine

## 2019-12-02 ENCOUNTER — Ambulatory Visit (HOSPITAL_COMMUNITY)
Admission: RE | Admit: 2019-12-02 | Discharge: 2019-12-02 | Disposition: A | Discharge status: Home / Self Care | Payer: Self-pay | Source: Ambulatory Visit | Attending: Gastroenterology | Admitting: Gastroenterology

## 2019-12-02 ENCOUNTER — Encounter: Payer: Self-pay | Admitting: Gastroenterology

## 2019-12-02 DIAGNOSIS — R188 Other ascites: Secondary | ICD-10-CM | POA: Insufficient documentation

## 2019-12-02 MED ORDER — ALBUMIN HUMAN 25 % IV SOLN: 50.0000 g | Freq: Once | INTRAVENOUS | Status: AC

## 2019-12-02 MED ORDER — ALBUMIN HUMAN 25 % IV SOLN
INTRAVENOUS | Status: AC
  Administered 2019-12-02: 50 g via INTRAVENOUS
  Filled 2019-12-02: qty 200

## 2019-12-02 MED ORDER — SODIUM CHLORIDE FLUSH 0.9 % IV SOLN
INTRAVENOUS | Status: AC
  Administered 2019-12-02: 10 mL
  Filled 2019-12-02: qty 50

## 2019-12-02 MED ORDER — LIDOCAINE HCL (PF) 2 % IJ SOLN
INTRAMUSCULAR | Status: AC
  Administered 2019-12-02: 10 mL
  Filled 2019-12-02: qty 10

## 2019-12-02 NOTE — Telephone Encounter (Signed)
I do not see that patient has a follow-up scheduled as of yet. I think he had to leave in a hurry to get to his next appointment after seeing me on Monday. Please arrange follow-up in 6-8 weeks with Dr. Jena Gauss. I would like for patient to see Dr. Jena Gauss as he has never had an OV with him.

## 2019-12-02 NOTE — Sedation Documentation (Signed)
Patient tolerated paracentesis procedure today well and 8L of cloudy yellow fluid removed. PT verbalized d/c instructions and will follow up as scheduled. PT received 50g Albumin IV during visit today.

## 2019-12-02 NOTE — Assessment & Plan Note (Addendum)
Patient reports chronic history of intermittent dilatation hematochezia in the setting of known prolapsing hemorrhoids.  Although patient reports known hemorrhoids, cannot rule out other colonic etiology such as colon polyps or malignancy.  Notably, patient is anemic.  History of upper GI bleed in April 2021.  During recent hospitalization, hemoglobin drifted from 9.7 on admission down to 7.9.  He denied overt GI bleeding and this was likely secondary to hemodilution as he was receiving runs of albumin.  No repeat CBC on file although patient reports having labs completed 3 days ago with nephrology.  We will request these for review. Discussed colonoscopy today, but patient declined as his focus is on his decompensated liver disease.  This can be rediscussed at his next office visit.

## 2019-12-02 NOTE — Telephone Encounter (Signed)
Called pt- no answer, VM not set up

## 2019-12-02 NOTE — Telephone Encounter (Signed)
See prior note. [facility] has adjusted orders

## 2019-12-02 NOTE — Addendum Note (Signed)
Addended by: Armstead Peaks on: 12/02/2019 08:07 AM   Modules accepted: Orders, SmartSet

## 2019-12-02 NOTE — Telephone Encounter (Signed)
Pt's wife called asking for Mindy. I told her she wasn't available. She asked for me to let her know that the kidney doctor had increased his O2 from 4 liters to 6-8 liters.

## 2019-12-02 NOTE — Assessment & Plan Note (Addendum)
History of anemia in the setting of alcoholic cirrhosis, history of upper GI bleed in April 2021.  EGD at that time revealed grade 1 esophageal varices, medium size hiatal hernia, Cameron erosions with portal gastropathy with some bleeding which was controlled with application of 2 clips.  He has maintained on Protonix 40 mg twice daily. During recent hospitalization, hemoglobin drifted from 9.7 down to 7.9 with no overt GI bleeding.  Overall, suspect this was likely secondary to hemodilution as he was receiving runs of IV albumin.  Patient reports chronic history of low volume toilet tissue hematochezia occurring about twice a week in the setting of prolapsing hemorrhoids.  No prior colonoscopy.  No repeat labs on file although patient reports having labs completed with nephrology 3 days ago.    As stated above, as patient's hemoglobin had been improving but then declined during hospitalization with no overt GI bleeding, I suspect this was likely secondary to increased intravascular volume and hemodilution.  Cannot rule out colonic source as he has never had colonoscopy.  Additionally, in the setting of cirrhosis with thrombocytopenia and elevated INR, he could easily be oozing from portal gastropathy, Cameron erosions, or AVMs throughout small bowel.    Discussed the possibility of a colonoscopy with patient today but he declined.  His primary focus is his decompensated liver disease.  Advised that he continue to monitor for any worsening rectal bleeding or black stools and continue Protonix 40 mg twice daily.  We will request labs from nephrology and will order additional labs if needed.  Plan to follow-up in 2 months.

## 2019-12-02 NOTE — Assessment & Plan Note (Signed)
Likely secondary to decompensated cirrhosis with significant ascites as well as lactulose.  Symptoms seem to improve after paracentesis.  Symptoms are currently fairly well managed with Zofran twice daily.  Advised to continue Zofran as needed.

## 2019-12-02 NOTE — Assessment & Plan Note (Addendum)
54 year old male with decompensated alcoholic cirrhosis (abstinent since February 2021), meld 26, child Pugh C, now following with [facility].  He is awaiting insurance approval before he can be considered for transplant.  EGD up-to-date in April 2021 with grade 1 esophageal varices.  Imaging up-to-date with CT in August 2021 with no focal liver lesions.  He needs hepatitis A/B vaccine.  Cirrhosis has been complicated by significant ascites/anasarca that has not responded to diuretics well.  Kidney function began to worsen, diuretics were discontinued, and he was receiving weekly paracentesis.  Prior evaluation for TIPS 10/23/2019 and deemed not a good candidate as meld was 25.  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  Nephrology recommended limiting para to 4 L and continuing to hold diuretics.  He has follow-up with nephrology later today.  Also with history of SBP in May.  Had been on Bactrim for SBP prophylaxis but nephrology recommended change in antibiotics due to kidney function.  Due to concerns of prior QT prolongation, discussed antibiotics with ID, and it was ultimately decided Xifaxan would be used for SBP prophylaxis. Notably, he was already on Xifaxan and lactulose for hepatic encephalopathy which is well controlled at this time.  Clinically, patient is fairly stable.  However, as he has been off of diuretics and missed his paracentesis on Friday, his abdomen is significantly distended and tight today.  Also with 2+ bilateral lower extremity pitting edema up to his knees (stable).  Notably, patient reports he did have labs completed on Friday with his nephrologist, but these are not available for my review.    Plan: 1.  We will request recent labs completed with nephrology.  If any additional labs are needed, will place orders. 2.  Keep follow-up with nephrology this afternoon and discuss possibly increasing volume of paracentesis depending on what his  repeat kidney function once.  He will need paracentesis today or tomorrow at the latest.  He has standing orders for paras and we may need to update these pending nephrology recommendations. 3.  Defer further diuretic medication to nephrology. 4.  Continue Xifaxan 550 mg twice daily for SBP prophylaxis and hepatic encephalopathy. 5.  Continue lactulose 30 mL twice daily with goal of 3-4 BMs daily. 6.  Continue low-sodium diet.  No more than 2000 mg per 24 hours. 7.  Continue protein shakes with goal of 3 daily. 8.  Use compression stockings to help with lower extremity edema. 9.  Complete hepatitis A/B vaccine. 10.  Keep upcoming appointment in October with [facility]. 11.  Follow-up in 2 months.

## 2019-12-02 NOTE — Telephone Encounter (Signed)
Updated standing order and it has been faxed

## 2019-12-02 NOTE — Procedures (Signed)
PreOperative Dx: Alcoholic cirrhosis, ascites Postoperative Dx: Alcoholic cirrhosis, ascites Procedure:   US guided paracentesis Radiologist:  Tyron Russell Anesthesia:  10 ml of1% lidocaine Specimen:  8.2 L of cloudy yellow ascitic fluid EBL:   < 1 ml Complications: None

## 2019-12-03 ENCOUNTER — Encounter: Payer: Self-pay | Admitting: Internal Medicine

## 2019-12-03 NOTE — Telephone Encounter (Signed)
Noted  

## 2019-12-03 NOTE — Progress Notes (Signed)
No pcp per patient 

## 2019-12-03 NOTE — Telephone Encounter (Signed)
Patient wife stated they would call for appointment, but I made one with Dr. Jena Gauss and sent appointment letter to him

## 2019-12-04 ENCOUNTER — Telehealth: Payer: Self-pay

## 2019-12-04 NOTE — Telephone Encounter (Signed)
Spoke with pts spouse. Pt saw his kidney doctor on Monday. Pt was told he could have 8 liters drawn off and pt had that done. Pts spouse would like to know should pt go back to once weekly paras rather than 2 since pts kidney doctor allowed pt to have 8 litter at a time drawn off?

## 2019-12-04 NOTE — Telephone Encounter (Signed)
Spoke with pts spouse. She is aware of Ermalinda Memos, PA's recommendations concerning paras.

## 2019-12-04 NOTE — Telephone Encounter (Signed)
It is really up to how he is feeling clinically. Having 2 a week is not a must. We put those orders in place in case he felt he really needed the second para. We do not want to do it too frequently as this can be hard on his kidneys; however, I do not want his abdomen being as tight as it was when I saw him on Monday. He doesn't have to get a full 8 L drawn off each time, 8L is the max. If he feels he needs another para before the weekend, he can call to get that arranged. Otherwise, waiting until Monday is ok. As he has resumed diuretics, he may not end up needing them quite as frequent. They will need to remember to hold diuretics the day of and the day after he has the para.

## 2019-12-05 ENCOUNTER — Ambulatory Visit (HOSPITAL_COMMUNITY): Payer: Self-pay

## 2019-12-05 ENCOUNTER — Ambulatory Visit (HOSPITAL_COMMUNITY)
Admission: RE | Admit: 2019-12-05 | Discharge: 2019-12-05 | Disposition: A | Discharge status: Home / Self Care | Payer: Self-pay | Source: Ambulatory Visit | Attending: Gastroenterology | Admitting: Gastroenterology

## 2019-12-05 ENCOUNTER — Encounter (HOSPITAL_COMMUNITY): Payer: Self-pay

## 2019-12-05 ENCOUNTER — Other Ambulatory Visit: Payer: Self-pay

## 2019-12-05 DIAGNOSIS — R188 Other ascites: Secondary | ICD-10-CM | POA: Insufficient documentation

## 2019-12-05 LAB — GRAM STAIN

## 2019-12-05 LAB — BODY FLUID CELL COUNT WITH DIFFERENTIAL
Eos, Fluid: 0 %
Lymphs, Fluid: 86 %
Monocyte-Macrophage-Serous Fluid: 12 % — ABNORMAL LOW (ref 50–90)
Neutrophil Count, Fluid: 2 % (ref 0–25)
Total Nucleated Cell Count, Fluid: 80 cu mm (ref 0–1000)

## 2019-12-05 MED ORDER — ALBUMIN HUMAN 25 % IV SOLN
INTRAVENOUS | Status: AC
  Administered 2019-12-05: 50 g via INTRAVENOUS
  Filled 2019-12-05: qty 200

## 2019-12-05 MED ORDER — ALBUMIN HUMAN 25 % IV SOLN
0.0000 g | Freq: Once | INTRAVENOUS | Status: AC
  Filled 2019-12-05: qty 400

## 2019-12-05 NOTE — Procedures (Signed)
PreOperative Dx: Alcoholic cirrhosis, ascites Postoperative Dx: Alcoholic cirrhosis, ascites Procedure:   US guided paracentesis Radiologist:  Tyron Russell Anesthesia:  10 ml of1% lidocaine Specimen:  8 L of cloudy yellow ascitic fluid EBL:   < 1 ml Complications: None

## 2019-12-05 NOTE — Sedation Documentation (Signed)
Patient had 8 liters removed from abdomen during paracentesis. Tolerated well. VSS. 50 Grams Albumin administered per order parameters. Patient left department in good condition

## 2019-12-07 ENCOUNTER — Telehealth: Payer: Self-pay | Admitting: Gastroenterology

## 2019-12-07 NOTE — Telephone Encounter (Signed)
Received labs from patient's nephrologist with collection date 11/28/2019.  CMP: Glucose 129 (H), BUN 19, creatinine 1.07, sodium 132 (L), potassium 2.9 (L), calcium 8.9, albumin 4.0, total bilirubin 2.6 (H), alk phos 107, AST 39, ALT 17 Hemoglobin 9.5 Phosphorus 2.8  Compared to labs on hospital discharge (11/18/2019), creatinine had improved from 1.64, hemoglobin improved from 7.9, total bilirubin improved from 3.4.  Per Dr. Joelyn Oms (nephrologist), he was restarting Lasix/spironolactone 100/40 daily and starting patient on 20 mEq of potassium chloride daily with plans to repeat labs in 2 weeks.  See telephone note 12/01/2019 for details.  Alicia: Please let patient know I have received labs that he had completed with his nephrologist. As he is aware, his kidney function and hemoglobin had improved since hospital discharge. His potassium was low and Dr. Joelyn Oms started him on oral potassium. He should have plans to repeat labs with Dr. Joelyn Oms in 1-2 weeks for monitoring. I would like to update an INR in order to recalculate meld and ensure stability since hospital discharge.  Please arrange.

## 2019-12-08 ENCOUNTER — Other Ambulatory Visit: Payer: Self-pay

## 2019-12-08 DIAGNOSIS — K7460 Unspecified cirrhosis of liver: Secondary | ICD-10-CM

## 2019-12-08 NOTE — Telephone Encounter (Signed)
Pts spouse returned call. Pt was notified that labs were received and INR level needs to be updated. Lab orders placed and pt will have when he has para completed this week.

## 2019-12-08 NOTE — Telephone Encounter (Signed)
Tried calling pt. No VM available. Will call pt back.  

## 2019-12-10 LAB — CULTURE, BODY FLUID W GRAM STAIN -BOTTLE: Culture: NO GROWTH

## 2019-12-12 ENCOUNTER — Other Ambulatory Visit: Payer: Self-pay

## 2019-12-12 ENCOUNTER — Other Ambulatory Visit (HOSPITAL_COMMUNITY)
Admission: RE | Admit: 2019-12-12 | Discharge: 2019-12-12 | Disposition: A | Discharge status: Home / Self Care | Payer: Self-pay | Source: Ambulatory Visit | Attending: Gastroenterology | Admitting: Gastroenterology

## 2019-12-12 ENCOUNTER — Ambulatory Visit (HOSPITAL_COMMUNITY)
Admission: RE | Admit: 2019-12-12 | Discharge: 2019-12-12 | Disposition: A | Discharge status: Home / Self Care | Payer: Self-pay | Source: Ambulatory Visit | Attending: Gastroenterology | Admitting: Gastroenterology

## 2019-12-12 DIAGNOSIS — K7460 Unspecified cirrhosis of liver: Secondary | ICD-10-CM | POA: Insufficient documentation

## 2019-12-12 DIAGNOSIS — R188 Other ascites: Secondary | ICD-10-CM

## 2019-12-12 LAB — BODY FLUID CELL COUNT WITH DIFFERENTIAL
Eos, Fluid: 1 %
Lymphs, Fluid: 91 %
Monocyte-Macrophage-Serous Fluid: 2 % — ABNORMAL LOW (ref 50–90)
Neutrophil Count, Fluid: 6 % (ref 0–25)
Total Nucleated Cell Count, Fluid: 79 cu mm (ref 0–1000)

## 2019-12-12 LAB — GRAM STAIN: Gram Stain: NONE SEEN

## 2019-12-12 LAB — PROTIME-INR
INR: 1.5 — ABNORMAL HIGH (ref 0.8–1.2)
Prothrombin Time: 17.4 seconds — ABNORMAL HIGH (ref 11.4–15.2)

## 2019-12-12 MED ORDER — ALBUMIN HUMAN 25 % IV SOLN
INTRAVENOUS | Status: AC
  Administered 2019-12-12: 50 g
  Filled 2019-12-12: qty 200

## 2019-12-12 NOTE — Procedures (Signed)
PreOperative Dx: Alcoholic cirrhosis, recurrent ascites Postoperative Dx: Alcoholic cirrhosis, recurrent ascites Procedure:   US guided paracentesis Radiologist:  Tyron Russell Anesthesia:  10 ml of1% lidocaine Specimen:  8 L of cloudy yellow ascitic fluid EBL:   < 1 ml Complications: None

## 2019-12-12 NOTE — Progress Notes (Signed)
Patient here for paracentesis.  20 guage  IV started in right forearm for albumin administration. Eight liters of cloudy yellow fluid removed.  Patient tolerated procedure well with no complaints.  IV removed and bandage applied.  Site has no redness or swelling.   Abran Cantor, RN

## 2019-12-16 LAB — PATHOLOGIST SMEAR REVIEW: Path Review: 10112021

## 2019-12-17 ENCOUNTER — Other Ambulatory Visit (HOSPITAL_COMMUNITY): Payer: Self-pay | Admitting: Gastroenterology

## 2019-12-17 DIAGNOSIS — R188 Other ascites: Secondary | ICD-10-CM

## 2019-12-17 LAB — CULTURE, BODY FLUID W GRAM STAIN -BOTTLE: Culture: NO GROWTH

## 2019-12-19 ENCOUNTER — Other Ambulatory Visit: Payer: Self-pay

## 2019-12-19 ENCOUNTER — Ambulatory Visit (HOSPITAL_COMMUNITY)
Admission: RE | Admit: 2019-12-19 | Discharge: 2019-12-19 | Disposition: A | Discharge status: Home / Self Care | Payer: Self-pay | Source: Ambulatory Visit | Attending: Gastroenterology | Admitting: Gastroenterology

## 2019-12-19 ENCOUNTER — Encounter (HOSPITAL_COMMUNITY): Payer: Self-pay

## 2019-12-19 DIAGNOSIS — R188 Other ascites: Secondary | ICD-10-CM | POA: Insufficient documentation

## 2019-12-19 LAB — BODY FLUID CELL COUNT WITH DIFFERENTIAL
Eos, Fluid: 0 %
Lymphs, Fluid: 84 %
Monocyte-Macrophage-Serous Fluid: 16 % — ABNORMAL LOW (ref 50–90)
Neutrophil Count, Fluid: 0 % (ref 0–25)
Total Nucleated Cell Count, Fluid: 88 cu mm (ref 0–1000)

## 2019-12-19 LAB — GRAM STAIN

## 2019-12-19 MED ORDER — ALBUMIN HUMAN 25 % IV SOLN
INTRAVENOUS | Status: AC
  Administered 2019-12-19: 50 g
  Filled 2019-12-19: qty 200

## 2019-12-22 LAB — PATHOLOGIST SMEAR REVIEW

## 2019-12-23 ENCOUNTER — Telehealth: Payer: Self-pay | Admitting: Internal Medicine

## 2019-12-23 ENCOUNTER — Other Ambulatory Visit: Payer: Self-pay | Admitting: *Deleted

## 2019-12-23 DIAGNOSIS — R188 Other ascites: Secondary | ICD-10-CM

## 2019-12-23 MED ORDER — ONDANSETRON HCL 4 MG PO TABS: 4.0000 mg | ORAL_TABLET | Freq: Three times a day (TID) | ORAL | 3 refills | Status: AC | PRN

## 2019-12-23 NOTE — Telephone Encounter (Signed)
[facility] Spoke with pts spouse. Pts spouse called to arrange para and was told our office has to call and schedule. She notified them that pt has a standing order and was told a second time that they can't accept this information from pt, our office needs to arrange.    [facility] refill- Zofran 4 mg, Take 4 mg by mouth every 8 (eight) hours as needed for nausea or vomiting.

## 2019-12-23 NOTE — Telephone Encounter (Signed)
Pt has standing orders and they can call central scheduling to schedule

## 2019-12-23 NOTE — Telephone Encounter (Signed)
Pt's wife called asking for patient to have a para and asked for it to be on Friday around 1pm. [phone]

## 2019-12-23 NOTE — Telephone Encounter (Signed)
I refilled Zofran. [facility] clinical pool, can we figure out what is going on with standing orders? See below.

## 2019-12-23 NOTE — Telephone Encounter (Signed)
Called central scheduling. PARA's have been scheduled. Spouse aware of appt details for PARA's

## 2019-12-24 LAB — CULTURE, BODY FLUID W GRAM STAIN -BOTTLE: Culture: NO GROWTH

## 2019-12-26 ENCOUNTER — Encounter (HOSPITAL_COMMUNITY): Payer: Self-pay

## 2019-12-26 ENCOUNTER — Ambulatory Visit (HOSPITAL_COMMUNITY)
Admission: RE | Admit: 2019-12-26 | Discharge: 2019-12-26 | Disposition: A | Discharge status: Home / Self Care | Payer: Self-pay | Source: Ambulatory Visit | Attending: Gastroenterology | Admitting: Gastroenterology

## 2019-12-26 ENCOUNTER — Other Ambulatory Visit: Payer: Self-pay

## 2019-12-26 DIAGNOSIS — R188 Other ascites: Secondary | ICD-10-CM | POA: Insufficient documentation

## 2019-12-26 LAB — BODY FLUID CELL COUNT WITH DIFFERENTIAL
Eos, Fluid: 1 %
Lymphs, Fluid: 94 %
Monocyte-Macrophage-Serous Fluid: 5 % — ABNORMAL LOW (ref 50–90)
Neutrophil Count, Fluid: 0 % (ref 0–25)
Total Nucleated Cell Count, Fluid: 132 cu mm (ref 0–1000)

## 2019-12-26 LAB — GRAM STAIN

## 2019-12-26 MED ORDER — ALBUMIN HUMAN 25 % IV SOLN
0.0000 g | Freq: Once | INTRAVENOUS | Status: AC
  Filled 2019-12-26: qty 400

## 2019-12-26 MED ORDER — ALBUMIN HUMAN 25 % IV SOLN
INTRAVENOUS | Status: AC
  Administered 2019-12-26: 50 g
  Filled 2019-12-26: qty 200

## 2019-12-26 NOTE — Procedures (Signed)
PreOperative Dx: Alcoholic cirrhosis, ascites Postoperative Dx: Alcoholic cirrhosis, ascites Procedure:   US guided paracentesis Radiologist:  Milan Clare Anesthesia:  10 ml of1% lidocaine Specimen:  8 L of cloudy yellow ascitic fluid EBL:   < 1 ml Complications: None  

## 2019-12-26 NOTE — Sedation Documentation (Signed)
PT tolerated paracentesis procedure well today and 8 Liters cloudy yellow fluid removed. Labs sent for processing at this time. PT ambulatory with cane to power wheelchair and left with his significant other with NAD noted. PT verbalized understanding of discharge instruction and will follow up as scheduled or needed.

## 2019-12-26 NOTE — Discharge Instructions (Signed)
Paracentesis, Care After °This sheet gives you information about how to care for yourself after your procedure. Your health care provider may also give you more specific instructions. If you have problems or questions, contact your health care provider. °What can I expect after the procedure? °After the procedure, it is common to have a small amount of clear fluid coming from the puncture site. °Follow these instructions at home: °Puncture site care ° °· Follow instructions from your health care provider about how to take care of your puncture site. Make sure you: °? Wash your hands with soap and water before and after you change your bandage (dressing). If soap and water are not available, use hand sanitizer. °? Change your dressing as told by your health care provider. °· Check your puncture area every day for signs of infection. Check for: °? Redness, swelling, or pain. °? More fluid or blood. °? Warmth. °? Pus or a bad smell. °General instructions °· Return to your normal activities as told by your health care provider. Ask your health care provider what activities are safe for you. °· Take over-the-counter and prescription medicines only as told by your health care provider. °· Do not take baths, swim, or use a hot tub until your health care provider approves. Ask your health care provider if you may take showers. You may only be allowed to take sponge baths. °· Keep all follow-up visits as told by your health care provider. This is important. °Contact a health care provider if: °· You have redness, swelling, or pain at your puncture site. °· You have more fluid or blood coming from your puncture site. °· Your puncture site feels warm to the touch. °· You have pus or a bad smell coming from your puncture site. °· You have a fever. °Get help right away if: °· You have chest pain or shortness of breath. °· You develop increasing pain, discomfort, or swelling in your abdomen. °· You feel dizzy or light-headed or  you faint. °Summary °· After the procedure, it is common to have a small amount of clear fluid coming from the puncture site. °· Follow instructions from your health care provider about how to take care of your puncture site. °· Check your puncture area every day signs of infection. °· Keep all follow-up visits as told by your health care provider. °This information is not intended to replace advice given to you by your health care provider. Make sure you discuss any questions you have with your health care provider. °Document Revised: 09/03/2018 Document Reviewed: 12/11/2017 °Elsevier Patient Education © 2020 [employer] Inc. ° °

## 2019-12-29 LAB — PATHOLOGIST SMEAR REVIEW

## 2019-12-30 ENCOUNTER — Ambulatory Visit: Payer: Self-pay | Admitting: Gastroenterology

## 2019-12-31 LAB — CULTURE, BODY FLUID W GRAM STAIN -BOTTLE: Culture: NO GROWTH

## 2020-01-02 ENCOUNTER — Ambulatory Visit (HOSPITAL_COMMUNITY): Admission: RE | Admit: 2020-01-02 | Payer: Self-pay | Source: Ambulatory Visit

## 2020-01-05 ENCOUNTER — Other Ambulatory Visit: Payer: Self-pay

## 2020-01-05 ENCOUNTER — Inpatient Hospital Stay (HOSPITAL_COMMUNITY): Payer: Self-pay

## 2020-01-05 ENCOUNTER — Inpatient Hospital Stay (HOSPITAL_COMMUNITY)
Admission: EM | Admit: 2020-01-05 | Discharge: 2020-01-10 | DRG: 682 | Disposition: A | Discharge status: Home / Self Care | Payer: Self-pay | Source: Ambulatory Visit | Attending: Internal Medicine | Admitting: Internal Medicine

## 2020-01-05 ENCOUNTER — Encounter (HOSPITAL_COMMUNITY): Payer: Self-pay | Admitting: *Deleted

## 2020-01-05 DIAGNOSIS — Z885 Allergy status to narcotic agent status: Secondary | ICD-10-CM

## 2020-01-05 DIAGNOSIS — E440 Moderate protein-calorie malnutrition: Secondary | ICD-10-CM | POA: Diagnosis present

## 2020-01-05 DIAGNOSIS — F1010 Alcohol abuse, uncomplicated: Secondary | ICD-10-CM | POA: Diagnosis present

## 2020-01-05 DIAGNOSIS — K429 Umbilical hernia without obstruction or gangrene: Secondary | ICD-10-CM | POA: Diagnosis present

## 2020-01-05 DIAGNOSIS — K7031 Alcoholic cirrhosis of liver with ascites: Secondary | ICD-10-CM

## 2020-01-05 DIAGNOSIS — K623 Rectal prolapse: Secondary | ICD-10-CM | POA: Diagnosis present

## 2020-01-05 DIAGNOSIS — E7220 Disorder of urea cycle metabolism, unspecified: Secondary | ICD-10-CM

## 2020-01-05 DIAGNOSIS — D638 Anemia in other chronic diseases classified elsewhere: Secondary | ICD-10-CM | POA: Diagnosis present

## 2020-01-05 DIAGNOSIS — K7040 Alcoholic hepatic failure without coma: Secondary | ICD-10-CM | POA: Diagnosis present

## 2020-01-05 DIAGNOSIS — K921 Melena: Secondary | ICD-10-CM | POA: Diagnosis present

## 2020-01-05 DIAGNOSIS — E871 Hypo-osmolality and hyponatremia: Secondary | ICD-10-CM | POA: Diagnosis present

## 2020-01-05 DIAGNOSIS — R188 Other ascites: Secondary | ICD-10-CM

## 2020-01-05 DIAGNOSIS — F32A Depression, unspecified: Secondary | ICD-10-CM | POA: Diagnosis present

## 2020-01-05 DIAGNOSIS — E860 Dehydration: Secondary | ICD-10-CM | POA: Diagnosis present

## 2020-01-05 DIAGNOSIS — N1832 Chronic kidney disease, stage 3b: Secondary | ICD-10-CM | POA: Diagnosis present

## 2020-01-05 DIAGNOSIS — R64 Cachexia: Secondary | ICD-10-CM | POA: Diagnosis present

## 2020-01-05 DIAGNOSIS — K7460 Unspecified cirrhosis of liver: Secondary | ICD-10-CM

## 2020-01-05 DIAGNOSIS — Z20822 Contact with and (suspected) exposure to covid-19: Secondary | ICD-10-CM | POA: Diagnosis present

## 2020-01-05 DIAGNOSIS — N189 Chronic kidney disease, unspecified: Secondary | ICD-10-CM | POA: Diagnosis present

## 2020-01-05 DIAGNOSIS — Z820 Family history of epilepsy and other diseases of the nervous system: Secondary | ICD-10-CM

## 2020-01-05 DIAGNOSIS — Z8719 Personal history of other diseases of the digestive system: Secondary | ICD-10-CM

## 2020-01-05 DIAGNOSIS — Z87891 Personal history of nicotine dependence: Secondary | ICD-10-CM

## 2020-01-05 DIAGNOSIS — Z8249 Family history of ischemic heart disease and other diseases of the circulatory system: Secondary | ICD-10-CM

## 2020-01-05 DIAGNOSIS — Z79899 Other long term (current) drug therapy: Secondary | ICD-10-CM

## 2020-01-05 DIAGNOSIS — K219 Gastro-esophageal reflux disease without esophagitis: Secondary | ICD-10-CM | POA: Diagnosis present

## 2020-01-05 DIAGNOSIS — K767 Hepatorenal syndrome: Secondary | ICD-10-CM | POA: Diagnosis present

## 2020-01-05 DIAGNOSIS — E872 Acidosis: Secondary | ICD-10-CM | POA: Diagnosis present

## 2020-01-05 DIAGNOSIS — K649 Unspecified hemorrhoids: Secondary | ICD-10-CM | POA: Diagnosis present

## 2020-01-05 DIAGNOSIS — N179 Acute kidney failure, unspecified: Principal | ICD-10-CM

## 2020-01-05 DIAGNOSIS — L409 Psoriasis, unspecified: Secondary | ICD-10-CM | POA: Diagnosis present

## 2020-01-05 DIAGNOSIS — D6959 Other secondary thrombocytopenia: Secondary | ICD-10-CM | POA: Diagnosis present

## 2020-01-05 DIAGNOSIS — Z6821 Body mass index (BMI) 21.0-21.9, adult: Secondary | ICD-10-CM

## 2020-01-05 LAB — URINALYSIS, ROUTINE W REFLEX MICROSCOPIC
Bacteria, UA: NONE SEEN
Bilirubin Urine: NEGATIVE
Glucose, UA: NEGATIVE mg/dL
Ketones, ur: NEGATIVE mg/dL
Leukocytes,Ua: NEGATIVE
Nitrite: NEGATIVE
Protein, ur: NEGATIVE mg/dL
Specific Gravity, Urine: 1.017 (ref 1.005–1.030)
pH: 5 (ref 5.0–8.0)

## 2020-01-05 LAB — CBC WITH DIFFERENTIAL/PLATELET
Abs Immature Granulocytes: 0.04 10*3/uL (ref 0.00–0.07)
Basophils Absolute: 0 10*3/uL (ref 0.0–0.1)
Basophils Relative: 1 %
Eosinophils Absolute: 0.1 10*3/uL (ref 0.0–0.5)
Eosinophils Relative: 2 %
HCT: 26.9 % — ABNORMAL LOW (ref 39.0–52.0)
Hemoglobin: 9.5 g/dL — ABNORMAL LOW (ref 13.0–17.0)
Immature Granulocytes: 1 %
Lymphocytes Relative: 15 %
Lymphs Abs: 1.1 10*3/uL (ref 0.7–4.0)
MCH: 34.9 pg — ABNORMAL HIGH (ref 26.0–34.0)
MCHC: 35.3 g/dL (ref 30.0–36.0)
MCV: 98.9 fL (ref 80.0–100.0)
Monocytes Absolute: 0.9 10*3/uL (ref 0.1–1.0)
Monocytes Relative: 13 %
Neutro Abs: 4.9 10*3/uL (ref 1.7–7.7)
Neutrophils Relative %: 68 %
Platelets: 180 10*3/uL (ref 150–400)
RBC: 2.72 MIL/uL — ABNORMAL LOW (ref 4.22–5.81)
RDW: 13.4 % (ref 11.5–15.5)
WBC: 7 10*3/uL (ref 4.0–10.5)
nRBC: 0 % (ref 0.0–0.2)

## 2020-01-05 LAB — COMPREHENSIVE METABOLIC PANEL
ALT: 24 U/L (ref 0–44)
AST: 38 U/L (ref 15–41)
Albumin: 3.5 g/dL (ref 3.5–5.0)
Alkaline Phosphatase: 89 U/L (ref 38–126)
Anion gap: 12 (ref 5–15)
BUN: 66 mg/dL — ABNORMAL HIGH (ref 6–20)
CO2: 17 mmol/L — ABNORMAL LOW (ref 22–32)
Calcium: 8.9 mg/dL (ref 8.9–10.3)
Chloride: 92 mmol/L — ABNORMAL LOW (ref 98–111)
Creatinine, Ser: 3.67 mg/dL — ABNORMAL HIGH (ref 0.61–1.24)
GFR, Estimated: 19 mL/min — ABNORMAL LOW (ref >60)
Glucose, Bld: 163 mg/dL — ABNORMAL HIGH (ref 70–99)
Potassium: 4.2 mmol/L (ref 3.5–5.1)
Sodium: 121 mmol/L — ABNORMAL LOW (ref 135–145)
Total Bilirubin: 2.9 mg/dL — ABNORMAL HIGH (ref 0.3–1.2)
Total Protein: 6.4 g/dL — ABNORMAL LOW (ref 6.5–8.1)

## 2020-01-05 LAB — PROTIME-INR
INR: 1.3 — ABNORMAL HIGH (ref 0.8–1.2)
Prothrombin Time: 15.4 seconds — ABNORMAL HIGH (ref 11.4–15.2)

## 2020-01-05 LAB — CREATININE, URINE, RANDOM: Creatinine, Urine: 185.48 mg/dL

## 2020-01-05 LAB — NA AND K (SODIUM & POTASSIUM), RAND UR
Potassium Urine: 48 mmol/L
Sodium, Ur: <10 mmol/L

## 2020-01-05 LAB — RESPIRATORY PANEL BY RT PCR (FLU A&B, COVID)
Influenza A by PCR: NEGATIVE
Influenza B by PCR: NEGATIVE
SARS Coronavirus 2 by RT PCR: NEGATIVE

## 2020-01-05 LAB — AMMONIA: Ammonia: 140 umol/L — ABNORMAL HIGH (ref 9–35)

## 2020-01-05 LAB — SODIUM, URINE, RANDOM: Sodium, Ur: <10 mmol/L

## 2020-01-05 MED ORDER — PANTOPRAZOLE SODIUM 40 MG PO TBEC
40.0000 mg | DELAYED_RELEASE_TABLET | Freq: Two times a day (BID) | ORAL | Status: DC
  Administered 2020-01-05 – 2020-01-10 (×11): 40 mg via ORAL
  Filled 2020-01-05 (×11): qty 1

## 2020-01-05 MED ORDER — ALBUMIN HUMAN 25 % IV SOLN
25.0000 g | Freq: Four times a day (QID) | INTRAVENOUS | Status: AC
  Administered 2020-01-05 – 2020-01-06 (×4): 25 g via INTRAVENOUS
  Filled 2020-01-05 (×6): qty 100

## 2020-01-05 MED ORDER — PROCHLORPERAZINE EDISYLATE 10 MG/2ML IJ SOLN
10.0000 mg | Freq: Four times a day (QID) | INTRAMUSCULAR | Status: DC | PRN
  Administered 2020-01-07 – 2020-01-08 (×2): 10 mg via INTRAVENOUS
  Filled 2020-01-05 (×2): qty 2

## 2020-01-05 MED ORDER — SODIUM CHLORIDE 0.9 % IV SOLN
50.0000 ug/h | INTRAVENOUS | Status: DC
  Administered 2020-01-05 – 2020-01-07 (×5): 50 ug/h via INTRAVENOUS
  Filled 2020-01-05 (×10): qty 1

## 2020-01-05 MED ORDER — MIDODRINE HCL 5 MG PO TABS
2.5000 mg | ORAL_TABLET | Freq: Two times a day (BID) | ORAL | Status: DC
  Administered 2020-01-05: 2.5 mg via ORAL
  Filled 2020-01-05 (×5): qty 1

## 2020-01-05 MED ORDER — LACTULOSE 10 GM/15ML PO SOLN
20.0000 g | Freq: Two times a day (BID) | ORAL | Status: DC
  Administered 2020-01-05 – 2020-01-10 (×4): 20 g via ORAL
  Filled 2020-01-05 (×10): qty 30

## 2020-01-05 MED ORDER — RIFAXIMIN 550 MG PO TABS
550.0000 mg | ORAL_TABLET | Freq: Two times a day (BID) | ORAL | Status: DC
  Administered 2020-01-05 – 2020-01-10 (×10): 550 mg via ORAL
  Filled 2020-01-05 (×11): qty 1

## 2020-01-05 NOTE — ED Provider Notes (Signed)
[facility] EMERGENCY DEPARTMENT Provider Note   CSN: [id number] Arrival date & time: 01/05/20  1115     History Chief Complaint  Patient presents with  . Abnormal Lab    Alan Phillips is a 54 y.o. male with a past medical history of end-stage liver disease, chronic kidney disease who presents to the emergency department for worsening kidney function.  Patient was sent in by his kidney specialist.  He states that recently he was taken off of his diuretics because of worsening kidney function.  When he was reevaluated today his kidney specialist told him that his kidney function was "too far gone for [place] to do anything about it outside of the hospital."  And he was advised to come be admitted to the hospital.  He states that other than that his abdominal fluid accumulation has increased because he has been unable to diurese at home.  He has no other complaints at this time.  HPI     Past Medical History:  Diagnosis Date  . Cirrhosis of liver (HCC)   . Depression   . Esophageal varices (HCC)    grade 1 (EGD April 2021)  . Hepatic encephalopathy (HCC)    managed with Xifaxan and lactulose  . Psoriasis   . SBP (spontaneous bacterial peritonitis) (HCC) 07/2019  . Upper GI bleed    April 2021; secondary to careron lesions and portal gastropathy s/p placement of 2 clips    Patient Active Problem List   Diagnosis Date Noted  . AKI (acute kidney injury) (HCC) 11/14/2019  . H/O: upper GI bleed 10/05/2019  . Nausea with vomiting 10/01/2019  . Rectal bleeding 08/14/2019  . Low blood pressure 08/14/2019  . Ascites due to alcoholic cirrhosis (HCC) 08/14/2019  . SBP (spontaneous bacterial peritonitis) (HCC) 07/25/2019  . Acute GI bleeding 06/19/2019  . Hematemesis 06/18/2019  . Hyponatremia   . Hypokalemia   . Anemia   . Coagulopathy (HCC)   . Prolonged QT interval   . Hepatic cirrhosis (HCC) 05/14/2019  . Anasarca 05/14/2019    Past Surgical History:  Procedure Laterality  Date  . BIOPSY  06/20/2019   Procedure: BIOPSY;  Surgeon: West Bali, MD;  [facility];  Service: Endoscopy;;  gastric  . ESOPHAGEAL BANDING N/A 06/20/2019   Procedure: ESOPHAGEAL BANDING;  Surgeon: West Bali, MD;  [facility];  Service: Endoscopy;  Laterality: N/A;  . ESOPHAGOGASTRODUODENOSCOPY (EGD) WITH PROPOFOL N/A 06/20/2019   Procedure: ESOPHAGOGASTRODUODENOSCOPY (EGD) WITH PROPOFOL;  Surgeon: West Bali, MD;  grade 1 esophageal varices, medium sized hiatal hernia, Cameron erosions, and portal gastropathy with some bleeding which was controlled with application of 2 clips. Bx: Neg H. pylori  . none         Family History  Problem Relation Age of Onset  . Cirrhosis Paternal Uncle        etoh  . Cancer Paternal Uncle   . Alcohol abuse Paternal Uncle   . Alzheimer's disease Father   . Heart failure Maternal Uncle     Social History   Tobacco Use  . Smoking status: Former Smoker    Packs/day: 0.50    Years: 42.00    Pack years: 21.00    Types: Cigarettes    Quit date: 12/05/2018    Years since quitting: 1.0  . Smokeless tobacco: Former Neurosurgeon    Quit date: 05/14/1986  . Tobacco comment: quit last year  Vaping Use  . Vaping Use: Never used  Substance Use Topics  . Alcohol use: Not Currently    Comment: h/o longtime etoh abuse but quit 04/2019 when he found out he has cirrhosis (05/14/19)  . Drug use: Yes    Types: Marijuana    Comment: few times a week     Home Medications Prior to Admission medications   Medication Sig Start Date End Date Taking? Authorizing Provider  baclofen (LIORESAL) 10 MG tablet TAKE 1 TABLET BY MOUTH THREE TIMES DAILY Patient taking differently: as needed.  09/09/19   Tiffany Kocher, PA-C  CONSTULOSE 10 GM/15ML solution take 30 mls BY MOUTH in THE morning AND AT BEDTIME Patient taking differently: Take 20 g by mouth 2 (two) times daily.  10/16/19   Anice Paganini, NP  ondansetron (ZOFRAN) 4 MG tablet Take 1 tablet  (4 mg total) by mouth every 8 (eight) hours as needed for nausea or vomiting. 12/23/19   Gelene Mink, NP  pantoprazole (PROTONIX) 40 MG tablet Take 1 tablet (40 mg total) by mouth 2 (two) times daily before a meal. 09/02/19 09/01/20  Gelene Mink, NP  rifaximin (XIFAXAN) 550 MG TABS tablet Take 1 tablet (550 mg total) by mouth 2 (two) times daily. 08/07/19   Tiffany Kocher, PA-C    Allergies    Codeine  Review of Systems   Review of Systems Ten systems reviewed and are negative for acute change, except as noted in the HPI.   Physical Exam Updated Vital Signs BP (!) 97/52 (BP Location: Right Arm)   Pulse (!) 104   Temp 97.8 F (36.6 C) (Oral)   Resp 16   Ht 5\' 9"  (1.753 m)   Wt 71.6 kg   SpO2 100%   BMI 23.31 kg/m   Physical Exam Vitals and nursing note reviewed.  Constitutional:      General: He is not in acute distress.    Appearance: He is well-developed. He is ill-appearing. He is not diaphoretic.     Comments:   Gaunt and cachectic in appearance  HENT:     Head: Normocephalic and atraumatic.     Comments: Petechial hemorrhages over the face Eyes:     General: No scleral icterus.    Conjunctiva/sclera: Conjunctivae normal.  Cardiovascular:     Rate and Rhythm: Normal rate and regular rhythm.     Heart sounds: Normal heart sounds.  Pulmonary:     Effort: Pulmonary effort is normal. No respiratory distress.     Breath sounds: Normal breath sounds.  Abdominal:     General: There is distension.     Palpations: Abdomen is soft.     Tenderness: There is no abdominal tenderness.  Musculoskeletal:     Cervical back: Normal range of motion and neck supple.  Skin:    General: Skin is warm and dry.  Neurological:     Mental Status: He is alert.  Psychiatric:        Behavior: Behavior normal.     ED Results / Procedures / Treatments   Labs (all labs ordered are listed, but only abnormal results are displayed) Labs Reviewed  COMPREHENSIVE METABOLIC PANEL  CBC WITH  DIFFERENTIAL/PLATELET    EKG None  Radiology No results found.  Procedures Procedures (including critical care time)  Medications Ordered in ED Medications - No data to display  ED Course  I have reviewed the triage vital signs and the nursing notes.  Pertinent labs & imaging results that were available during my care of the patient  were reviewed by me and considered in my medical decision making (see chart for details).    MDM Rules/Calculators/A&P                          CC: Abnormal labs and worsening renal function VS:  Today's Vitals   01/05/20 1321 01/05/20 1322 01/05/20 1330 01/05/20 1400  BP: 98/71  97/69 (!) 94/49  Pulse:  89 85 84  Resp:      Temp:      TempSrc:      SpO2:  100% 100% 97%  Weight:      Height:       Body mass index is 23.31 kg/m.sm  PY:KDXIPJA is gathered by patient and EMR. Previous records obtained and reviewed. DDX:The patient's complaint of worsening renal function involves an extensive number of diagnostic and treatment options, and is a complaint that carries with it a high risk of complications, morbidity, and potential mortality. Given the large differential diagnosis, medical decision making is of high complexity.  Dehydration, hepatorenal syndrome, hypoalbuminemia and third spacing Labs: I ordered reviewed and interpreted labs which include CBC without elevated white blood cell count, hemoglobin shows normocytic anemia just above his normal level of 7.9 today at 9.5.  This may represent some volume contraction Ammonia elevated at 140 however patient is mentating normally. CMP shows BUN of 66, creatinine of 3.67 albumin is normal at 3.5, no anion gap. Imaging: EKG: Consults: Dr. Kathrene Bongo of [facility] with recommendations for medical management MDM: Patient here with worsening kidney function.  This is likely resultant from hepatorenal failure.  He is followed by Kateri Mc I am unsure if he is on the transplant list.   Patient will be admitted to Dr. Gwenlyn Perking.  He is stable throughout his visit here in the emergency department. Patient disposition:The patient appears reasonably stabilized for admission considering the current resources, flow, and capabilities available in the ED at this time, and I doubt any other [facility] requiring further screening and/or treatment in the ED prior to admission.     Alan Phillips was evaluated in Emergency Department on 01/05/2020 for the symptoms described in the history of present illness. He was evaluated in the context of the global COVID-19 pandemic, which necessitated consideration that the patient might be at risk for infection with the SARS-CoV-2 virus that causes COVID-19. Institutional protocols and algorithms that pertain to the evaluation of patients at risk for COVID-19 are in a state of rapid change based on information released by regulatory bodies including the CDC and federal and state organizations. These policies and algorithms were followed during the patient's care in the ED.  Final Clinical Impression(s) / ED Diagnoses Final diagnoses:  None    Rx / DC Orders ED Discharge Orders    None       Arthor Captain, PA-C 01/05/20 1527    Long, Arlyss Repress, MD 01/09/20 [phone]

## 2020-01-05 NOTE — ED Notes (Signed)
ED TO INPATIENT HANDOFF REPORT  ED Nurse Name and Phone #:  [phone]  S Name/Age/Gender Alan Phillips 54 y.o. male Room/Bed: APA18/APA18  Code Status   Code Status: Full Code  Home/SNF/Other Home Patient oriented to: self, place, time and situation Is this baseline? Yes   Triage Complete: Triage complete  Chief Complaint Acute on chronic renal failure (HCC) [N17.9, N18.9]  Triage Note States he was advised to come in for evaluation of kidney function    Allergies Allergies  Allergen Reactions  . Codeine Hives, Itching and Swelling    Other reaction(s): Dizziness    Level of Care/Admitting Diagnosis ED Disposition    ED Disposition Condition Comment   Admit  Hospital Area: [facility] [[id number]]  Level of Care: Telemetry [5]  Covid Evaluation: Confirmed COVID Negative  Diagnosis: Acute on chronic renal failure [facility]) [[id number]]  Admitting Physician: Vassie Loll [[id number]]  Attending Physician: Vassie Loll [[id number]]  Estimated length of stay: past midnight tomorrow  Certification:: I certify this patient will need inpatient services for at least 2 midnights       B Medical/Surgery History Past Medical History:  Diagnosis Date  . Cirrhosis of liver (HCC)   . Depression   . Esophageal varices (HCC)    grade 1 (EGD April 2021)  . Hepatic encephalopathy (HCC)    managed with Xifaxan and lactulose  . Psoriasis   . SBP (spontaneous bacterial peritonitis) (HCC) 07/2019  . Upper GI bleed    April 2021; secondary to careron lesions and portal gastropathy s/p placement of 2 clips   Past Surgical History:  Procedure Laterality Date  . BIOPSY  06/20/2019   Procedure: BIOPSY;  Surgeon: West Bali, MD;  [facility];  Service: Endoscopy;;  gastric  . ESOPHAGEAL BANDING N/A 06/20/2019   Procedure: ESOPHAGEAL BANDING;  Surgeon: West Bali, MD;  [facility];  Service: Endoscopy;  Laterality: N/A;  . ESOPHAGOGASTRODUODENOSCOPY (EGD)  WITH PROPOFOL N/A 06/20/2019   Procedure: ESOPHAGOGASTRODUODENOSCOPY (EGD) WITH PROPOFOL;  Surgeon: West Bali, MD;  grade 1 esophageal varices, medium sized hiatal hernia, Cameron erosions, and portal gastropathy with some bleeding which was controlled with application of 2 clips. Bx: Neg H. pylori  . none       A IV Location/Drains/Wounds Patient Lines/Drains/Airways Status    Active Line/Drains/Airways    Name Placement date Placement time Site Days   Peripheral IV 01/05/20 Left Arm 01/05/20  1430  Arm  less than 1          Intake/Output Last 24 hours  Intake/Output Summary (Last 24 hours) at 01/05/2020 1931 Last data filed at 01/05/2020 5170 Gross per 24 hour  Intake 100 ml  Output --  Net 100 ml    Labs/Imaging Results for orders placed or performed during the hospital encounter of 01/05/20 (from the past 48 hour(s))  Respiratory Panel by RT PCR (Flu A&B, Covid) - Nasopharyngeal Swab     Status: None   Collection Time: 01/05/20 12:31 PM   Specimen: Nasopharyngeal Swab  Result Value Ref Range   SARS Coronavirus 2 by RT PCR NEGATIVE NEGATIVE    Comment: (NOTE) SARS-CoV-2 target nucleic acids are NOT DETECTED.  The SARS-CoV-2 RNA is generally detectable in upper respiratoy specimens during the acute phase of infection. The lowest concentration of SARS-CoV-2 viral copies this assay can detect is 131 copies/mL. A negative result does not preclude SARS-Cov-2 infection and should not be used as the sole basis for treatment or  other patient management decisions. A negative result may occur with  improper specimen collection/handling, submission of specimen other than nasopharyngeal swab, presence of viral mutation(s) within the areas targeted by this assay, and inadequate number of viral copies (<131 copies/mL). A negative result must be combined with clinical observations, patient history, and epidemiological information. The expected result is Negative.  Fact Sheet  for Patients:  https://www.moore.com/  Fact Sheet for Healthcare Providers:  https://www.young.biz/  This test is no t yet approved or cleared by the [place] FDA and  has been authorized for detection and/or diagnosis of SARS-CoV-2 by FDA under an Emergency Use Authorization (EUA). This EUA will remain  in effect (meaning this test can be used) for the duration of the COVID-19 declaration under Section 564(b)(1) of the Act, 21 U.S.C. section 360bbb-3(b)(1), unless the authorization is terminated or revoked sooner.     Influenza A by PCR NEGATIVE NEGATIVE   Influenza B by PCR NEGATIVE NEGATIVE    Comment: (NOTE) The Xpert Xpress SARS-CoV-2/FLU/RSV assay is intended as an aid in  the diagnosis of influenza from Nasopharyngeal swab specimens and  should not be used as a sole basis for treatment. Nasal washings and  aspirates are unacceptable for Xpert Xpress SARS-CoV-2/FLU/RSV  testing.  Fact Sheet for Patients: https://www.moore.com/  Fact Sheet for Healthcare Providers: https://www.young.biz/  This test is not yet approved or cleared by the [place] FDA and  has been authorized for detection and/or diagnosis of SARS-CoV-2 by  FDA under an Emergency Use Authorization (EUA). This EUA will remain  in effect (meaning this test can be used) for the duration of the  Covid-19 declaration under Section 564(b)(1) of the Act, 21  U.S.C. section 360bbb-3(b)(1), unless the authorization is  terminated or revoked. Performed at [facility], [address] [place], [place] [address]   Comprehensive metabolic panel     Status: Abnormal   Collection Time: 01/05/20  1:08 PM  Result Value Ref Range   Sodium 121 (L) 135 - 145 mmol/L   Potassium 4.2 3.5 - 5.1 mmol/L   Chloride 92 (L) 98 - 111 mmol/L   CO2 17 (L) 22 - 32 mmol/L   Glucose, Bld 163 (H) 70 - 99 mg/dL    Comment: Glucose reference range  applies only to samples taken after fasting for at least 8 hours.   BUN 66 (H) 6 - 20 mg/dL   Creatinine, Ser 9.48 (H) 0.61 - 1.24 mg/dL   Calcium 8.9 8.9 - 54.6 mg/dL   Total Protein 6.4 (L) 6.5 - 8.1 g/dL   Albumin 3.5 3.5 - 5.0 g/dL   AST 38 15 - 41 U/L   ALT 24 0 - 44 U/L   Alkaline Phosphatase 89 38 - 126 U/L   Total Bilirubin 2.9 (H) 0.3 - 1.2 mg/dL   GFR, Estimated 19 (L) >60 mL/min    Comment: (NOTE) Calculated using the CKD-EPI Creatinine Equation (2021)    Anion gap 12 5 - 15    Comment: Performed at [facility], [address] [place], [place] [address]  CBC with Differential     Status: Abnormal   Collection Time: 01/05/20  1:08 PM  Result Value Ref Range   WBC 7.0 4.0 - 10.5 K/uL   RBC 2.72 (L) 4.22 - 5.81 MIL/uL   Hemoglobin 9.5 (L) 13.0 - 17.0 g/dL   HCT 00.9 (L) 39 - 52 %   MCV 98.9 80.0 - 100.0 fL   MCH 34.9 (H) 26.0 - 34.0  pg   MCHC 35.3 30.0 - 36.0 g/dL   RDW 29.5 62.1 - 30.8 %   Platelets 180 150 - 400 K/uL   nRBC 0.0 0.0 - 0.2 %   Neutrophils Relative % 68 %   Neutro Abs 4.9 1.7 - 7.7 K/uL   Lymphocytes Relative 15 %   Lymphs Abs 1.1 0.7 - 4.0 K/uL   Monocytes Relative 13 %   Monocytes Absolute 0.9 0.1 - 1.0 K/uL   Eosinophils Relative 2 %   Eosinophils Absolute 0.1 0.0 - 0.5 K/uL   Basophils Relative 1 %   Basophils Absolute 0.0 0.0 - 0.1 K/uL   Immature Granulocytes 1 %   Abs Immature Granulocytes 0.04 0.00 - 0.07 K/uL    Comment: Performed at [facility], [address], [place], [place] [address]  Ammonia     Status: Abnormal   Collection Time: 01/05/20  1:08 PM  Result Value Ref Range   Ammonia 140 (H) 9 - 35 umol/L    Comment: Performed at [facility], [address] [place], [place] [address]  Protime-INR     Status: Abnormal   Collection Time: 01/05/20  1:09 PM  Result Value Ref Range   Prothrombin Time 15.4 (H) 11.4 - 15.2 seconds   INR 1.3 (H) 0.8 - 1.2    Comment: (NOTE) INR goal varies based on device and disease  states. Performed at [facility], [address], [place], [place] [address]    US RENAL  Result Date: 01/05/2020 CLINICAL DATA:  54 year old male with acute on chronic renal failure. EXAM: RENAL / URINARY TRACT ULTRASOUND COMPLETE COMPARISON:  Renal ultrasound 11/14/2019. CT Abdomen and Pelvis 10/16/2019. FINDINGS: Right Kidney: Renal measurements: 10.8 x 3.4 x 4.9 cm (unchanged) = volume: 94 mL. Renal cortical echogenicity is stable since September, at the upper limits of normal to mildly increased. No right hydronephrosis or renal lesion. Left Kidney: Less well visualized today. Estimated renal measurements: 10.2 x 3.5 x 3.4 cm = volume: 63 mL. Left renal echogenicity stable and at the upper limits of normal (image 30). No left hydronephrosis or renal lesion. Bladder: Diminutive, unremarkable. Other: Ascites throughout the abdomen. Nodular, cirrhotic liver (image 14). IMPRESSION: 1. No acute renal finding. Borderline to mildly echogenic kidneys compatible suspicious for medical renal disease. 2. Ascites.  Cirrhosis. Electronically Signed   By: Odessa Fleming M.D.   On: 01/05/2020 16:51    Pending Labs Unresulted Labs (From admission, onward)          Start     Ordered   01/06/20 0500  Basic metabolic panel  Tomorrow morning,   R        01/05/20 1521   01/05/20 1508  Urinalysis, Routine w reflex microscopic  Once,   STAT        01/05/20 1517   01/05/20 1508  Sodium, urine, random  Once,   STAT        01/05/20 1517   01/05/20 1508  Protein Electro, Random Urine  Once,   STAT        01/05/20 1517          Vitals/Pain Today's Vitals   01/05/20 1322 01/05/20 1330 01/05/20 1400 01/05/20 1853  BP:  97/69 (!) 94/49   Pulse: 89 85 84   Resp:      Temp:      TempSrc:      SpO2: 100% 100% 97%   Weight:      Height:  PainSc:    0-No pain    Isolation Precautions No active isolations  Medications Medications  albumin human 25 % solution 25 g (0 g Intravenous Stopped 01/05/20 1852)   octreotide (SANDOSTATIN) 500 mcg in sodium chloride 0.9 % 250 mL (2 mcg/mL) infusion (50 mcg/hr Intravenous New Bag/Given 01/05/20 1857)  midodrine (PROAMATINE) tablet 2.5 mg (2.5 mg Oral Given 01/05/20 1707)  lactulose (CHRONULAC) 10 GM/15ML solution 20 g (20 g Oral Given 01/05/20 1552)  rifaximin (XIFAXAN) tablet 550 mg (550 mg Oral Given 01/05/20 1552)  pantoprazole (PROTONIX) EC tablet 40 mg (40 mg Oral Given 01/05/20 1552)  prochlorperazine (COMPAZINE) injection 10 mg (has no administration in time range)    Mobility walks with person assist Low fall risk   Focused Assessments    R Recommendations: See Admitting Provider Note  Report given to:   Additional Notes:

## 2020-01-05 NOTE — ED Triage Notes (Signed)
States he was advised to come in for evaluation of kidney function

## 2020-01-05 NOTE — ED Notes (Signed)
Pt had BM.

## 2020-01-05 NOTE — H&P (Signed)
History and Physical    Arye Weyenberg KGY:[id number] DOB: [birth year] DOA: 01/05/2020  PCP: Patient, No Pcp Per   Patient coming from: home  I have personally briefly reviewed patient's old medical records in [facility]  Chief Complaint: Worsening renal function; increased ascites and general malaise.  HPI: Alan Phillips is a 54 y.o. 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  Patient reports requiring likely increase in the amount of paracentesis secondary to worsening his ascites and initially increased use of diuretics to try to help control involvement; since Tuesday (12/29/19) diuretics were discontinued in the setting of worsening creatinine and electrolytes.  Patient reports that on evaluation and nephrology's office his repeat blood work were worse and he was advised to come to the hospital for further evaluation and management.  Patient reports no chest pain, no nausea, no vomiting, no hematuria, no dysuria, no abdominal pain, no hematemesis, some intermittent scant bright red blood in his stools after having multiple bowel movements due to the use of lactulose.  No fever, no shortness of breath, no focal weakness.  Appetite is decreased and he had general malaise.  Covid test negative.  ED Course: Hyponatremia, creatinine 3.67, BUN in the 70s range, hyperammonemia (ammonia 140) and positive metabolic acidosis.  Blood pressure systolic in the 90s on presentation.  Gastroenterologist and nephrology were consulted; TRH has been called to place patient in the hospital for further evaluation and management of what appears to be hepatorenal syndrome.  Review of Systems: As per HPI otherwise all other systems reviewed and are negative.   Past Medical History:   Diagnosis Date  . Cirrhosis of liver (HCC)   . Depression   . Esophageal varices (HCC)    grade 1 (EGD April 2021)  . Hepatic encephalopathy (HCC)    managed with Xifaxan and lactulose  . Psoriasis   . SBP (spontaneous bacterial peritonitis) (HCC) 07/2019  . Upper GI bleed    April 2021; secondary to careron lesions and portal gastropathy s/p placement of 2 clips    Past Surgical History:  Procedure Laterality Date  . BIOPSY  06/20/2019   Procedure: BIOPSY;  Surgeon: West Bali, MD;  [facility]: [facility];  Service: Endoscopy;;  gastric  . ESOPHAGEAL BANDING N/A 06/20/2019   Procedure: ESOPHAGEAL BANDING;  Surgeon: West Bali, MD;  Location: [facility];  Service: Endoscopy;  Laterality: N/A;  . ESOPHAGOGASTRODUODENOSCOPY (EGD) WITH PROPOFOL N/A 06/20/2019   Procedure: ESOPHAGOGASTRODUODENOSCOPY (EGD) WITH PROPOFOL;  Surgeon: West Bali, MD;  grade 1 esophageal varices, medium sized hiatal hernia, Cameron erosions, and portal gastropathy with some bleeding which was controlled with application of 2 clips. Bx: Neg H. pylori  . none      Social History  reports that he quit smoking about 13 months ago. His smoking use included cigarettes. He has a 21.00 pack-year smoking history. He quit smokeless tobacco use about 33 years ago. He reports previous alcohol use. He reports current drug use. Drug: Marijuana.  Allergies  Allergen Reactions  . Codeine Hives, Itching and Swelling    Other reaction(s): Dizziness    Family History  Problem Relation Age of Onset  . Cirrhosis Paternal Uncle        etoh  . Cancer Paternal Uncle   . Alcohol abuse Paternal Uncle   .  Alzheimer's disease Father   . Heart failure Maternal Uncle     Prior to Admission medications   Medication Sig Start Date End Date Taking? Authorizing Provider  baclofen (LIORESAL) 10 MG tablet TAKE 1 TABLET BY MOUTH THREE TIMES DAILY Patient taking differently: as needed.  09/09/19  Yes Tiffany Kocher,  PA-C  CONSTULOSE 10 GM/15ML solution take 30 mls BY MOUTH in THE morning AND AT BEDTIME Patient taking differently: Take 20 g by mouth 2 (two) times daily.  10/16/19  Yes Anice Paganini, NP  ondansetron (ZOFRAN) 4 MG tablet Take 1 tablet (4 mg total) by mouth every 8 (eight) hours as needed for nausea or vomiting. 12/23/19  Yes Gelene Mink, NP  pantoprazole (PROTONIX) 40 MG tablet Take 1 tablet (40 mg total) by mouth 2 (two) times daily before a meal. 09/02/19 09/01/20 Yes Gelene Mink, NP  potassium chloride SA (KLOR-CON) 20 MEQ tablet Take 1 tablet by mouth daily. 12/01/19  Yes [provider]  rifaximin (XIFAXAN) 550 MG TABS tablet Take 1 tablet (550 mg total) by mouth 2 (two) times daily. 08/07/19  Yes Tiffany Kocher, PA-C  Zinc 50 MG TABS Take 1 tablet by mouth daily.   Yes [provider]    Physical Exam: Vitals:   01/05/20 1321 01/05/20 1322 01/05/20 1330 01/05/20 1400  BP: 98/71  97/69 (!) 94/49  Pulse:  89 85 84  Resp:      Temp:      TempSrc:      SpO2:  100% 100% 97%  Weight:      Height:        Constitutional: Mildly confused and is slow to response at times; in no major distress.  Reports no chest pain, abdominal pain or shortness of breath.  Chronically ill in appearance. Vitals:   01/05/20 1321 01/05/20 1322 01/05/20 1330 01/05/20 1400  BP: 98/71  97/69 (!) 94/49  Pulse:  89 85 84  Resp:      Temp:      TempSrc:      SpO2:  100% 100% 97%  Weight:      Height:       Eyes: PERRL, lids and conjunctivae normal; no icterus. ENMT: Mucous membranes are moist. Posterior pharynx clear of any exudate or lesions. Neck: normal, supple, no masses, no thyromegaly, no JVD. Respiratory: clear to auscultation bilaterally, no wheezing, no crackles. Normal respiratory effort. No accessory muscle use.  Cardiovascular: Regular rate and rhythm, no murmurs / rubs / gallops. No extremity edema.  No lower extremity edema. Abdomen: Significantly distended abdomen, positive  fluid wave, umbilical hernia, spider veins appreciated.  Positive bowel sounds, no tenderness to palpation.  Patient expressed feeling tight.   Musculoskeletal: no clubbing / cyanosis. No joint deformity upper and lower extremities. Good ROM, no contractures. Normal muscle tone.  Skin: no rashes or open wound. Neurologic: CN 2-12 grossly intact. Sensation intact, DTR normal. Strength 3-4/5 in all 4 limbs in the setting of poor effort.Marland Kitchen  Psychiatric: No agitation, oriented x3; sometimes is slow to response and may become forgetful of the conversation topics.  Labs on Admission: I have personally reviewed following labs and imaging studies  CBC: Recent Labs  Lab 01/05/20 1308  WBC 7.0  NEUTROABS 4.9  HGB 9.5*  HCT 26.9*  MCV 98.9  PLT 180    Basic Metabolic Panel: Recent Labs  Lab 01/05/20 1308  NA 121*  K 4.2  CL 92*  CO2 17*  GLUCOSE  163*  BUN 66*  CREATININE 3.67*  CALCIUM 8.9    GFR: Estimated Creatinine Clearance: 22.7 mL/min (A) (by C-G formula based on SCr of 3.67 mg/dL (H)).  Liver Function Tests: Recent Labs  Lab 01/05/20 1308  AST 38  ALT 24  ALKPHOS 89  BILITOT 2.9*  PROT 6.4*  ALBUMIN 3.5    Urine analysis:    Component Value Date/Time   COLORURINE YELLOW 11/14/2019 1421   APPEARANCEUR CLEAR 11/14/2019 1421   LABSPEC 1.015 11/14/2019 1421   PHURINE 5.0 11/14/2019 1421   GLUCOSEU NEGATIVE 11/14/2019 1421   HGBUR MODERATE (A) 11/14/2019 1421   BILIRUBINUR NEGATIVE 11/14/2019 1421   KETONESUR NEGATIVE 11/14/2019 1421   PROTEINUR NEGATIVE 11/14/2019 1421   NITRITE NEGATIVE 11/14/2019 1421   LEUKOCYTESUR NEGATIVE 11/14/2019 1421    Radiological Exams on Admission: US RENAL  Result Date: 01/05/2020 CLINICAL DATA:  54 year old male with acute on chronic renal failure. EXAM: RENAL / URINARY TRACT ULTRASOUND COMPLETE COMPARISON:  Renal ultrasound 11/14/2019. CT Abdomen and Pelvis 10/16/2019. FINDINGS: Right Kidney: Renal measurements: 10.8 x 3.4 x  4.9 cm (unchanged) = volume: 94 mL. Renal cortical echogenicity is stable since September, at the upper limits of normal to mildly increased. No right hydronephrosis or renal lesion. Left Kidney: Less well visualized today. Estimated renal measurements: 10.2 x 3.5 x 3.4 cm = volume: 63 mL. Left renal echogenicity stable and at the upper limits of normal (image 30). No left hydronephrosis or renal lesion. Bladder: Diminutive, unremarkable. Other: Ascites throughout the abdomen. Nodular, cirrhotic liver (image 14). IMPRESSION: 1. No acute renal finding. Borderline to mildly echogenic kidneys compatible suspicious for medical renal disease. 2. Ascites.  Cirrhosis. Electronically Signed   By: Odessa Fleming M.D.   On: 01/05/2020 16:51   Assessment/Plan 1-acute on chronic renal failure -stage IIIb at baseline -With concern for hepatorenal syndrome -After discussing with Dr. Marisue Humble will initiate midodrine, octreotide and albumin -Checking renal ultrasound, urine sodium, urine protein and urinalysis -Avoid contrast, hypertension, large volume paracentesis and IV fluids at this moment. -Follow-up renal function and electrolytes trend.  2-end-stage liver cirrhosis -Continue to follow low-sodium diet -Continue the use of Xifaxan and lactulose -Follow GI service recommendations. -Continue PPI.  3-ascites -Avoiding paracentesis currently -Unable to use diuretics with worsening renal function -Continue albumin and once blood pressure and renal function is dialyzes provide relieving paracentesis without large volume removal.  4-hyponatremia -In the setting of renal failure and chronic cirrhosis -Follow electrolytes trend -Follow nephrology recommendations.  5-metabolic acidosis -Bicarb 17 -Will follow trend -In the setting of renal failure  6-gastroesophageal reflux disease and history of esophageal varices -continue PPI  7-moderate protein calorie malnutrition -In the setting of chronic  cirrhosis -Dietitian will be consulted.  DVT prophylaxis: SCDs Code Status:   Full code Family Communication:  Wife at bedside. Disposition Plan:   Patient is from:  Home  Anticipated DC to:  To be determined  Anticipated DC date:  To be determined  Anticipated DC barriers: Renal function stabilization and improvement of his ascites/decompensated cirrhosis.  Consults called:  Nephrology service and gastroenterology service. Admission status:  Inpatient, telemetry, length of stay more than 2 midnights  Severity of Illness: Moderate to severe illness; presenting with hepatorenal syndrome.  Patient will require albumin infusion, midodrine initiation along with octreotide infusion.  Close monitoring by nephrology service and gastroenterology to fully stabilize his condition.  Prognosis is guarded.     Vassie Loll MD [facility]  How to contact the [facility] Attending  or Consulting provider 7A - 7P or covering provider during after hours 7P -7A, for this patient?   1. Check the care team in [facility] and look for a) attending/consulting [facility] provider listed and b) the [facility] team listed 2. Log into [email or web address] and use [place]'s universal password to access. If you do not have the password, please contact the hospital operator. 3. Locate the [facility] provider you are looking for under [facility] and page to a number that you can be directly reached. 4. If you still have difficulty reaching the provider, please page the [facility] (Director on Call) for the Hospitalists listed on amion for assistance.  01/05/2020, 6:18 PM

## 2020-01-05 NOTE — Consult Note (Signed)
Nephrology Consult  [facility]  Requesting provider: Vassie Loll, MD  Assessment/Recommendations:  AKI: -baseline Cr around 1 -suspecting HRS, has been off diuretics for >48 hours with no improvement in labs. Agree with albumin, octreotide, midodrine to start with -renal ultrasound without obstruction -UA w/ sediment examination -GI on board, appreciate reccs -Continue to monitor daily Cr, Dose meds for GFR<15 -Monitor Daily I/Os, Daily weight  -Maintain MAP>65 for optimal renal perfusion.  -Agree with holding ACE-I/ARB/MRA, avoid further nephrotoxins including NSAIDS, Morphine.  Unless absolutely necessary, avoid CT with contrast and/or MRI with gadolinium.     Acute on chronic hyponatremia -suspecting HRS physiology, nonetheless would fluid restrict. Check serum+urine osm, urine lytes. Liberalize solute intake -recheck after bolus to ensure stability  Non-anion gap metabolic acidosis -likely from AKI, monitor for now, if not any better would recommend starting nahco3 1300mg  TID  Decompensated EtOH cirrhosis/end stage liver disease -lack of insurance is the biggest barrier for transplant. GI on board, appreciate assistance  Ascites -monitoring for now, no plan for LVP as of now, limit paracentesis to 5L per GI  Protein calorie malnutrition -dietician to see the patient    Recommendations conveyed to primary service.    [facility] 01/05/2020 7:23 PM   _____________________________________________________________________________________   History of Present Illness: Alan Phillips is a 54 y.o. male with a past medical history of alcohol cirrhosis, ascites requiring frequent LVPs, esophageal varices, depression, CKD, chronic hyponatremia who presents to [facility] with worsening kidney function on labs.  Of note, has a history of HRS type II.  Has been having increased fluid removed on paracentesis lately (at 8 L), last paracentesis  on 10/22.  Was following with Dr. 11/22 and was noted to have worsening creatinine (1 then 1.9 then 2.5 then 3.2 on 10/29) along with worsening hyponatremia (down to 122) which prompted discontinuation of his diuretics on 10/25. Currently following with transplant hepatology at [facility]. Currently, lack of health insurance has been a barrier to transplant. Otherwise denies any fevers, chills, chest pain, shortness of breath, orthopnea, dizziness, changes in urinary frequency, dysuria.  He does report diarrhea secondary to lactulose.    Medications:  Current Facility-Administered Medications  Medication Dose Route Frequency Provider Last Rate Last Admin  . albumin human 25 % solution 25 g  25 g Intravenous Q6H [facility], MD   Stopped at 01/05/20 1852  . lactulose (CHRONULAC) 10 GM/15ML solution 20 g  20 g Oral BID 13/01/21, MD   20 g at 01/05/20 1552  . midodrine (PROAMATINE) tablet 2.5 mg  2.5 mg Oral BID WC 13/01/21, MD   2.5 mg at 01/05/20 1707  . octreotide (SANDOSTATIN) 500 mcg in sodium chloride 0.9 % 250 mL (2 mcg/mL) infusion  50 mcg/hr Intravenous Continuous 13/01/21, MD 25 mL/hr at 01/05/20 1857 50 mcg/hr at 01/05/20 1857  . pantoprazole (PROTONIX) EC tablet 40 mg  40 mg Oral BID 13/01/21, MD   40 mg at 01/05/20 1552  . prochlorperazine (COMPAZINE) injection 10 mg  10 mg Intravenous Q6H PRN 13/01/21, MD      . rifaximin Vassie Loll) tablet 550 mg  550 mg Oral BID Burman Blacksmith, MD   550 mg at 01/05/20 1552   Current Outpatient Medications  Medication Sig Dispense Refill  . baclofen (LIORESAL) 10 MG tablet TAKE 1 TABLET BY MOUTH THREE TIMES DAILY (Patient taking differently: as needed. ) 90 tablet 2  . CONSTULOSE 10 GM/15ML solution take 30 mls BY MOUTH  in THE morning AND AT BEDTIME (Patient taking differently: Take 20 g by mouth 2 (two) times daily. ) 1892 mL 3  . ondansetron (ZOFRAN) 4 MG tablet Take 1 tablet (4 mg total) by mouth every 8 (eight) hours as  needed for nausea or vomiting. 60 tablet 3  . pantoprazole (PROTONIX) 40 MG tablet Take 1 tablet (40 mg total) by mouth 2 (two) times daily before a meal. 60 tablet 5  . potassium chloride SA (KLOR-CON) 20 MEQ tablet Take 1 tablet by mouth daily.    . rifaximin (XIFAXAN) 550 MG TABS tablet Take 1 tablet (550 mg total) by mouth 2 (two) times daily. 60 tablet 5  . Zinc 50 MG TABS Take 1 tablet by mouth daily.       ALLERGIES Codeine  MEDICAL HISTORY Past Medical History:  Diagnosis Date  . Cirrhosis of liver (HCC)   . Depression   . Esophageal varices (HCC)    grade 1 (EGD April 2021)  . Hepatic encephalopathy (HCC)    managed with Xifaxan and lactulose  . Psoriasis   . SBP (spontaneous bacterial peritonitis) (HCC) 07/2019  . Upper GI bleed    April 2021; secondary to careron lesions and portal gastropathy s/p placement of 2 clips     SOCIAL HISTORY Social History   Socioeconomic History  . Marital status: Married    Spouse name: Not on file  . Number of children: Not on file  . Years of education: Not on file  . Highest education level: Not on file  Occupational History  . Not on file  Tobacco Use  . Smoking status: Former Smoker    Packs/day: 0.50    Years: 42.00    Pack years: 21.00    Types: Cigarettes    Quit date: 12/05/2018    Years since quitting: 1.0  . Smokeless tobacco: Former Neurosurgeon    Quit date: 05/14/1986  . Tobacco comment: quit last year  Vaping Use  . Vaping Use: Never used  Substance and Sexual Activity  . Alcohol use: Not Currently    Comment: h/o longtime etoh abuse but quit 04/2019 when he found out he has cirrhosis (05/14/19)  . Drug use: Yes    Types: Marijuana    Comment: few times a week   . Sexual activity: Yes  Other Topics Concern  . Not on file  Social History Narrative  . Not on file   Social Determinants of Health   Financial Resource Strain:   . Difficulty of Paying Living Expenses: Not on file  Food Insecurity:   . Worried  About Programme researcher, broadcasting/film/video in the Last Year: Not on file  . Ran Out of Food in the Last Year: Not on file  Transportation Needs:   . Lack of Transportation (Medical): Not on file  . Lack of Transportation (Non-Medical): Not on file  Physical Activity:   . Days of Exercise per Week: Not on file  . Minutes of Exercise per Session: Not on file  Stress:   . Feeling of Stress : Not on file  Social Connections:   . Frequency of Communication with Friends and Family: Not on file  . Frequency of Social Gatherings with Friends and Family: Not on file  . Attends Religious Services: Not on file  . Active Member of Clubs or Organizations: Not on file  . Attends Banker Meetings: Not on file  . Marital Status: Not on file  Intimate Partner Violence:   .  Fear of Current or Ex-Partner: Not on file  . Emotionally Abused: Not on file  . Physically Abused: Not on file  . Sexually Abused: Not on file     FAMILY HISTORY Family History  Problem Relation Age of Onset  . Cirrhosis Paternal Uncle        etoh  . Cancer Paternal Uncle   . Alcohol abuse Paternal Uncle   . Alzheimer's disease Father   . Heart failure Maternal Uncle      Review of Systems: 12 systems reviewed Otherwise as per HPI, all other systems reviewed and negative  Physical Exam: Vitals:   01/05/20 1330 01/05/20 1400  BP: 97/69 (!) 94/49  Pulse: 85 84  Resp:    Temp:    SpO2: 100% 97%   No intake/output data recorded.  Intake/Output Summary (Last 24 hours) at 01/05/2020 1923 Last data filed at 01/05/2020 6237 Gross per 24 hour  Intake 100 ml  Output --  Net 100 ml   General: Chronically ill-appearing HEENT: anicteric sclera, oropharynx clear without lesions, temporal wasting CV: regular rate, normal rhythm, no murmurs, no gallops, no rubs, no peripheral edema Lungs: clear to auscultation bilaterally, normal work of breathing Abd: Distended and tight, nontender Skin: no visible lesions or  rashes Psych: alert, engaged, appropriate mood and affect Musculoskeletal: no obvious deformities Neuro: normal speech, no gross focal deficits   Test Results Reviewed Lab Results  Component Value Date   NA 121 (L) 01/05/2020   K 4.2 01/05/2020   CL 92 (L) 01/05/2020   CO2 17 (L) 01/05/2020   BUN 66 (H) 01/05/2020   CREATININE 3.67 (H) 01/05/2020   CALCIUM 8.9 01/05/2020   ALBUMIN 3.5 01/05/2020   PHOS 3.3 06/18/2019     I have reviewed all relevant outside healthcare records related to the patient's kidney injury.

## 2020-01-05 NOTE — Consult Note (Signed)
Referring Provider: Dr. Gwenlyn Perking  Primary Care Physician:  Patient, No Pcp Per Primary Gastroenterologist:  Dr. Jena Gauss   Date of Admission:  Date of Consultation:   Reason for Consultation: Decompensated cirrhosis with development of hepatorenal syndrome   HPI:  Alan Phillips is a 54 y.o. 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 Pollyann Samples. He has not been able to obtain insurance, which has been the main hurdle in transplant evaluation. Pertinent history includes hepatic encephalopathy and on Xifaxan and lactulose, need for frequent LVAPs and diuretic therapy challenging in setting of hyponatremia and hypotension, SBP in May 2021 and on Xifaxan for prophylaxis after discussion with ID (Nephrology recommending avoiding Bactrim and not candidate for Cipro), GI bleed in April 2021 felt multifactorial in setting of Cameron erosions, portal gastropathy, gastritis; he did have Grade 1 varices on exam. He has been unable to tolerate non-selective beta blocker therapy. High MELD precludes TIPS placement. Admitted Sept 2021 with acute renal failure and felt to have hepatorenal syndrome Type 2. [name] had been recommended to be limited to 4 liters; however, he has had recent paras with 8 liters removed.   Creatinine 1.7 at [facility] on 10/13. On admission acute on chronic renal failure with creatinine 3.67. Sodium 121. Patient states he was restarted on diuretic therapy by Nephrology as an outpatient and increase para amount per patient. Labs completed last week and was told to stop diuretic therapy as of last Tuesday. Repeat labs with bump in creatinine and Nephrology recommending ED evaluation.   Denies abdominal pain, only feeling tight. Feels like he needs another para. Mild confusion is fleeting at times. Having approximately 3 BMs daily. Scant blood per rectum at times if frequent stools.   Past Medical History:   Diagnosis Date  . Cirrhosis of liver (HCC)   . Depression   . Esophageal varices (HCC)    grade 1 (EGD April 2021)  . Hepatic encephalopathy (HCC)    managed with Xifaxan and lactulose  . Psoriasis   . SBP (spontaneous bacterial peritonitis) (HCC) 07/2019  . Upper GI bleed    April 2021; secondary to careron lesions and portal gastropathy s/p placement of 2 clips    Past Surgical History:  Procedure Laterality Date  . BIOPSY  06/20/2019   Procedure: BIOPSY;  Surgeon: West Bali, MD;  [facility]: [facility];  Service: Endoscopy;;  gastric  . ESOPHAGEAL BANDING N/A 06/20/2019   Procedure: ESOPHAGEAL BANDING;  Surgeon: West Bali, MD;  Location: [facility];  Service: Endoscopy;  Laterality: N/A;  . ESOPHAGOGASTRODUODENOSCOPY (EGD) WITH PROPOFOL N/A 06/20/2019   Procedure: ESOPHAGOGASTRODUODENOSCOPY (EGD) WITH PROPOFOL;  Surgeon: West Bali, MD;  grade 1 esophageal varices, medium sized hiatal hernia, Cameron erosions, and portal gastropathy with some bleeding which was controlled with application of 2 clips. Bx: Neg H. pylori  . none      Prior to Admission medications   Medication Sig Start Date End Date Taking? Authorizing Provider  baclofen (LIORESAL) 10 MG tablet TAKE 1 TABLET BY MOUTH THREE TIMES DAILY Patient taking differently: as needed.  09/09/19   Tiffany Kocher, PA-C  CONSTULOSE 10 GM/15ML solution take 30 mls BY MOUTH in THE morning AND AT BEDTIME Patient taking differently: Take 20 g by mouth 2 (two) times daily.  10/16/19   Anice Paganini, NP  ondansetron (ZOFRAN) 4 MG tablet Take 1 tablet (4 mg total) by mouth  every 8 (eight) hours as needed for nausea or vomiting. 12/23/19   Gelene Mink, NP  pantoprazole (PROTONIX) 40 MG tablet Take 1 tablet (40 mg total) by mouth 2 (two) times daily before a meal. 09/02/19 09/01/20  Gelene Mink, NP  rifaximin (XIFAXAN) 550 MG TABS tablet Take 1 tablet (550 mg total) by mouth 2 (two) times daily. 08/07/19   Tiffany Kocher,  PA-C    Current Facility-Administered Medications  Medication Dose Route Frequency Provider Last Rate Last Admin  . albumin human 25 % solution 25 g  25 g Intravenous Q6H Vassie Loll, MD      . lactulose (CHRONULAC) 10 GM/15ML solution 20 g  20 g Oral BID Vassie Loll, MD   20 g at 01/05/20 1552  . midodrine (PROAMATINE) tablet 2.5 mg  2.5 mg Oral BID WC Vassie Loll, MD      . octreotide (SANDOSTATIN) 500 mcg in sodium chloride 0.9 % 250 mL (2 mcg/mL) infusion  50 mcg/hr Intravenous Continuous Vassie Loll, MD      . pantoprazole (PROTONIX) EC tablet 40 mg  40 mg Oral BID Vassie Loll, MD   40 mg at 01/05/20 1552  . prochlorperazine (COMPAZINE) injection 10 mg  10 mg Intravenous Q6H PRN Vassie Loll, MD      . rifaximin Burman Blacksmith) tablet 550 mg  550 mg Oral BID Vassie Loll, MD   550 mg at 01/05/20 1552   Current Outpatient Medications  Medication Sig Dispense Refill  . baclofen (LIORESAL) 10 MG tablet TAKE 1 TABLET BY MOUTH THREE TIMES DAILY (Patient taking differently: as needed. ) 90 tablet 2  . CONSTULOSE 10 GM/15ML solution take 30 mls BY MOUTH in THE morning AND AT BEDTIME (Patient taking differently: Take 20 g by mouth 2 (two) times daily. ) 1892 mL 3  . ondansetron (ZOFRAN) 4 MG tablet Take 1 tablet (4 mg total) by mouth every 8 (eight) hours as needed for nausea or vomiting. 60 tablet 3  . pantoprazole (PROTONIX) 40 MG tablet Take 1 tablet (40 mg total) by mouth 2 (two) times daily before a meal. 60 tablet 5  . rifaximin (XIFAXAN) 550 MG TABS tablet Take 1 tablet (550 mg total) by mouth 2 (two) times daily. 60 tablet 5    Allergies as of 01/05/2020 - Review Complete 01/05/2020  Allergen Reaction Noted  . Codeine Hives, Itching, and Swelling 05/14/2019    Family History  Problem Relation Age of Onset  . Cirrhosis Paternal Uncle        etoh  . Cancer Paternal Uncle   . Alcohol abuse Paternal Uncle   . Alzheimer's disease Father   . Heart failure Maternal Uncle      Social History   Socioeconomic History  . Marital status: Married    Spouse name: Not on file  . Number of children: Not on file  . Years of education: Not on file  . Highest education level: Not on file  Occupational History  . Not on file  Tobacco Use  . Smoking status: Former Smoker    Packs/day: 0.50    Years: 42.00    Pack years: 21.00    Types: Cigarettes    Quit date: 12/05/2018    Years since quitting: 1.0  . Smokeless tobacco: Former Neurosurgeon    Quit date: 05/14/1986  . Tobacco comment: quit last year  Vaping Use  . Vaping Use: Never used  Substance and Sexual Activity  . Alcohol use: Not Currently  Comment: h/o longtime etoh abuse but quit 04/2019 when he found out he has cirrhosis (05/14/19)  . Drug use: Yes    Types: Marijuana    Comment: few times a week   . Sexual activity: Yes  Other Topics Concern  . Not on file  Social History Narrative  . Not on file   Social Determinants of Health   Financial Resource Strain:   . Difficulty of Paying Living Expenses: Not on file  Food Insecurity:   . Worried About Programme researcher, broadcasting/film/video in the Last Year: Not on file  . Ran Out of Food in the Last Year: Not on file  Transportation Needs:   . Lack of Transportation (Medical): Not on file  . Lack of Transportation (Non-Medical): Not on file  Physical Activity:   . Days of Exercise per Week: Not on file  . Minutes of Exercise per Session: Not on file  Stress:   . Feeling of Stress : Not on file  Social Connections:   . Frequency of Communication with Friends and Family: Not on file  . Frequency of Social Gatherings with Friends and Family: Not on file  . Attends Religious Services: Not on file  . Active Member of Clubs or Organizations: Not on file  . Attends Banker Meetings: Not on file  . Marital Status: Not on file  Intimate Partner Violence:   . Fear of Current or Ex-Partner: Not on file  . Emotionally Abused: Not on file  . Physically Abused:  Not on file  . Sexually Abused: Not on file    Review of Systems: Gen: Denies fever, chills, loss of appetite, change in weight or weight loss CV: Denies chest pain, heart palpitations, syncope, edema  Resp: Denies shortness of breath with rest, cough, wheezing GI: see hPI GU : Denies urinary burning, urinary frequency, urinary incontinence.  MS: Denies joint pain,swelling, cramping Derm: Denies rash, itching, dry skin Psych: Denies depression, anxiety,confusion, or memory loss Heme: see HPI  Physical Exam: Vital signs in last 24 hours: Temp:  [97.8 F (36.6 C)] 97.8 F (36.6 C) (11/01 1142) Pulse Rate:  [84-104] 84 (11/01 1400) Resp:  [16] 16 (11/01 1142) BP: (94-98)/(49-71) 94/49 (11/01 1400) SpO2:  [97 %-100 %] 97 % (11/01 1400) Weight:  [71.6 kg] 71.6 kg (11/01 1143)   General:   Alert, chronically ill-appearing, cachectic and sallow complexion Head:  Normocephalic and atraumatic. Eyes:  Sclera clear, no icterus.   Ears:  Normal auditory acuity. Nose:  No deformity, discharge,  or lesions. Lungs:  Clear throughout to auscultation.    Heart: S1 S2 present without murmurs Abdomen:  Distended, protruding umbilical hernia, tense ascites, no ttp Rectal:  Deferred  Msk:  Symmetrical without gross deformities. Normal posture. Pulses:  Normal pulses noted. Extremities:  Without  edema. Neurologic:  Alert and  oriented x4; mild asterixis Psych:  Alert and cooperative. Normal mood and affect.  Intake/Output from previous day: No intake/output data recorded. Intake/Output this shift: No intake/output data recorded.  Lab Results: Recent Labs    01/05/20 1308  WBC 7.0  HGB 9.5*  HCT 26.9*  PLT 180   BMET Recent Labs    01/05/20 1308  NA 121*  K 4.2  CL 92*  CO2 17*  GLUCOSE 163*  BUN 66*  CREATININE 3.67*  CALCIUM 8.9   LFT Recent Labs    01/05/20 1308  PROT 6.4*  ALBUMIN 3.5  AST 38  ALT 24  ALKPHOS 89  BILITOT 2.9*   Lab Results  Component  Value Date   INR 1.3 (H) 01/05/2020   INR 1.5 (H) 12/12/2019   INR 1.8 (H) 11/17/2019      Impression: 54 year old male with history of end-stage liver disease due to ETOH cirrhosis, MELD 32 on admission, presenting with acute on chronic renal failure. Upon review with patient, appears he had resumed diuretic therapy per Nephrology but was unable to tolerate this with worsening renal function. Large volume paras (8 liters) weekly also contributing to overall decline. Hopefully, with gentle hydration and supportive measures already started (octreotide, midodrine, and albumin), he will have improvement in renal function. [facility] to inform of patient's admission. Currently, lack of insurance has impeded transplant evaluation, but he remains [facility]   Hepatic encephalopathy: ammonia elevated but clinically he is alert and oriented. Mild asterixis on exam. Continue with Xifaxan and lactulose dosing.  Ascites: will need a para at some point this admission once stabilized; recommend no more than 4-5 liters at one time, along with IV albumin.  History of SBP: continue on SBP prophylaxis in the way of Xifaxan due to limited antibiotic choices.     Plan: Agree with Nephrology consultation Any future paras limit to 5 liters and will need IV albumin at time of para Continue Xifaxan and Lactulose dosing [facility] with update on patient Para in future once stabilized Will continue to follow with you  Gelene MinkAnna W. Libero Puthoff, PhD, ANP-BC [facility]       LOS: 0 days    01/05/2020, 4:43 PM

## 2020-01-06 ENCOUNTER — Encounter (HOSPITAL_COMMUNITY): Payer: Self-pay

## 2020-01-06 DIAGNOSIS — R188 Other ascites: Secondary | ICD-10-CM

## 2020-01-06 DIAGNOSIS — E7220 Disorder of urea cycle metabolism, unspecified: Secondary | ICD-10-CM

## 2020-01-06 DIAGNOSIS — K7460 Unspecified cirrhosis of liver: Secondary | ICD-10-CM

## 2020-01-06 LAB — BASIC METABOLIC PANEL
Anion gap: 12 (ref 5–15)
BUN: 63 mg/dL — ABNORMAL HIGH (ref 6–20)
CO2: 17 mmol/L — ABNORMAL LOW (ref 22–32)
Calcium: 9.3 mg/dL (ref 8.9–10.3)
Chloride: 94 mmol/L — ABNORMAL LOW (ref 98–111)
Creatinine, Ser: 3.57 mg/dL — ABNORMAL HIGH (ref 0.61–1.24)
GFR, Estimated: 19 mL/min — ABNORMAL LOW (ref >60)
Glucose, Bld: 120 mg/dL — ABNORMAL HIGH (ref 70–99)
Potassium: 4.3 mmol/L (ref 3.5–5.1)
Sodium: 123 mmol/L — ABNORMAL LOW (ref 135–145)

## 2020-01-06 LAB — CBC
HCT: 21.8 % — ABNORMAL LOW (ref 39.0–52.0)
Hemoglobin: 7.5 g/dL — ABNORMAL LOW (ref 13.0–17.0)
MCH: 34.6 pg — ABNORMAL HIGH (ref 26.0–34.0)
MCHC: 34.4 g/dL (ref 30.0–36.0)
MCV: 100.5 fL — ABNORMAL HIGH (ref 80.0–100.0)
Platelets: 130 10*3/uL — ABNORMAL LOW (ref 150–400)
RBC: 2.17 MIL/uL — ABNORMAL LOW (ref 4.22–5.81)
RDW: 13.2 % (ref 11.5–15.5)
WBC: 5.4 10*3/uL (ref 4.0–10.5)
nRBC: 0 % (ref 0.0–0.2)

## 2020-01-06 LAB — OSMOLALITY, URINE: Osmolality, Ur: 484 mOsm/kg (ref 300–900)

## 2020-01-06 LAB — OSMOLALITY: Osmolality: 286 mOsm/kg (ref 275–295)

## 2020-01-06 MED ORDER — SODIUM BICARBONATE 650 MG PO TABS
650.0000 mg | ORAL_TABLET | Freq: Two times a day (BID) | ORAL | Status: DC
  Administered 2020-01-06 – 2020-01-10 (×9): 650 mg via ORAL
  Filled 2020-01-06 (×9): qty 1

## 2020-01-06 MED ORDER — DARBEPOETIN ALFA 60 MCG/0.3ML IJ SOSY
60.0000 ug | PREFILLED_SYRINGE | INTRAMUSCULAR | Status: DC
  Filled 2020-01-06: qty 0.3

## 2020-01-06 MED ORDER — MIDODRINE HCL 5 MG PO TABS
10.0000 mg | ORAL_TABLET | Freq: Two times a day (BID) | ORAL | Status: DC
  Administered 2020-01-06 – 2020-01-07 (×2): 10 mg via ORAL
  Filled 2020-01-06 (×2): qty 2

## 2020-01-06 NOTE — TOC Initial Note (Signed)
Transition of Care [facility]) - Initial/Assessment Note    Patient Details  Name: Alan Phillips MRN: [id number] Date of Birth: 1965-10-18  Transition of Care [facility]) CM/SW Contact:    Annice Needy, LCSW Phone Number: 01/06/2020, 11:28 AM  Clinical Narrative:                 Patient from home with spouse. Uses cane, does not drive, wife takes to appointments. Uninsured. No PCP. Only sees GI physician. Previously active with [facility]. Interested in restarting with [facility]. Referral made to [facility]. Advised that patient no longer had insurance. Lorne Skeens states that she will contact patient to reopen.  Patient has no issues obtaining medications.    Expected Discharge Plan: Home/Self Care Barriers to Discharge: Continued Medical Work up   Patient Goals and CMS Choice Patient states their goals for this hospitalization and ongoing recovery are:: return home      Expected Discharge Plan and Services Expected Discharge Plan: Home/Self Care       Living arrangements for the past 2 months: Single Family Home                                      Prior Living Arrangements/Services Living arrangements for the past 2 months: Single Family Home Lives with:: Spouse Patient language and need for interpreter reviewed:: Yes Do you feel safe going back to the place where you live?: Yes      Need for Family Participation in Patient Care: Yes (Comment) Care giver support system in place?: Yes (comment)   Criminal Activity/Legal Involvement Pertinent to Current Situation/Hospitalization: No - Comment as needed  Activities of Daily Living Home Assistive Devices/Equipment: Eyeglasses, Cane (specify quad or straight), Dentures (specify type) ADL Screening (condition at time of admission) Patient's cognitive ability adequate to safely complete daily activities?: Yes Is the patient deaf or have difficulty hearing?: No Does the patient have difficulty seeing, even  when wearing glasses/contacts?: No Does the patient have difficulty concentrating, remembering, or making decisions?: No Patient able to express need for assistance with ADLs?: Yes Does the patient have difficulty dressing or bathing?: No Independently performs ADLs?: Yes (appropriate for developmental age) Does the patient have difficulty walking or climbing stairs?: No Weakness of Legs: None Weakness of Arms/Hands: None  Permission Sought/Granted Permission sought to share information with : Family Supports, Other (comment) Permission granted to share information with : Yes, Release of Information Signed  Share Information with NAME: [name], [name]     Permission granted to share info w Relationship: wife, [facility]     Emotional Assessment Appearance:: Appears stated age   Affect (typically observed): Appropriate Orientation: : Oriented to Self, Oriented to Place, Oriented to  Time, Oriented to Situation Alcohol / Substance Use: Not Applicable Psych Involvement: No (comment)  Admission diagnosis:  Hyperammonemia (HCC) [E72.20] Acute on chronic renal failure (HCC) [N17.9, N18.9] AKI (acute kidney injury) (HCC) [N17.9] Patient Active Problem List   Diagnosis Date Noted  . Acute on chronic renal failure (HCC) 01/05/2020  . AKI (acute kidney injury) (HCC) 11/14/2019  . H/O: upper GI bleed 10/05/2019  . Nausea with vomiting 10/01/2019  . Rectal bleeding 08/14/2019  . Low blood pressure 08/14/2019  . Ascites due to alcoholic cirrhosis (HCC) 08/14/2019  . SBP (spontaneous bacterial peritonitis) (HCC) 07/25/2019  . Acute GI bleeding 06/19/2019  . Hematemesis 06/18/2019  . Hyponatremia   .  Hypokalemia   . Anemia   . Coagulopathy (HCC)   . Prolonged QT interval   . Hepatic cirrhosis (HCC) 05/14/2019  . Anasarca 05/14/2019   PCP:  Patient, No Pcp Per Pharmacy:   [facility] [place] - [address] [place] [address] Phone:  [phone] Fax: [phone]     Social Determinants of Health (SDOH) Interventions    Readmission Risk Interventions Readmission Risk Prevention Plan 11/16/2019  Transportation Screening Complete  PCP or Specialist Appt within 3-5 Days Complete  HRI or Home Care Consult Complete  Social Work Consult for Recovery Care Planning/Counseling Complete  Palliative Care Screening Not Applicable  Medication Review Oceanographer) Complete

## 2020-01-06 NOTE — Progress Notes (Signed)
Subjective:  Only 250 of UOP recorded-  Renal function stable overnight-  Sodium a little better-  Remains on albumin, octreotide and midodrine - he has no c/o's   Objective Vital signs in last 24 hours: Vitals:   01/05/20 2000 01/05/20 2044 01/06/20 0201 01/06/20 0346  BP: 106/77 107/72 110/75 (!) 114/53  Pulse: 85 93 85 98  Resp:  15 18   Temp:  97.7 F (36.5 C) 98.2 F (36.8 C) 98.3 F (36.8 C)  TempSrc:   Oral Oral  SpO2: 100% 100% 100% 99%  Weight:      Height:       Weight change:   Intake/Output Summary (Last 24 hours) at 01/06/2020 0910 Last data filed at 01/06/2020 0542 Gross per 24 hour  Intake 269.92 ml  Output 250 ml  Net 19.92 ml    Assessment/ Plan: Pt is a 55 y.o. yo male with cirrhosis and previous HRS medically managed who was admitted on 01/05/2020 with AKI  Assessment/Plan: 1. Renal - AKI from recent baseline in the setting of possible volume depletion with diuretics vs looking like another flare of HRS.  Have held diuretics, started medical therapy with octreotide, albumin and midodrine.  Stable overnight which is actually a good sign 2. HTN/vol-  Ascites but no longer with peripheral edema-  Diuretics on hold- midodrine on board as well as albumin 3. Hyponatremia-  Cirrhosis physiology and HRS-  Has improved a little overnight, cont to hold diuretics 4. Cirrhosis -  MELD score in the 30's-  Had been [facility] at [facility] to get on list for transplant-  Not sure what status is.  GI to contact them  5. Acidosis-  Start oral bicarb  6. Anemia-  Will check iron stores and add ESA   Cecille Aver    Labs: Basic Metabolic Panel: Recent Labs  Lab 01/05/20 1308 01/06/20 0603  NA 121* 123*  K 4.2 4.3  CL 92* 94*  CO2 17* 17*  GLUCOSE 163* 120*  BUN 66* 63*  CREATININE 3.67* 3.57*  CALCIUM 8.9 9.3   Liver Function Tests: Recent Labs  Lab 01/05/20 1308  AST 38  ALT 24  ALKPHOS 89  BILITOT 2.9*  PROT 6.4*  ALBUMIN 3.5   No results for  input(s): LIPASE, AMYLASE in the last 168 hours. Recent Labs  Lab 01/05/20 1308  AMMONIA 140*   CBC: Recent Labs  Lab 01/05/20 1308  WBC 7.0  NEUTROABS 4.9  HGB 9.5*  HCT 26.9*  MCV 98.9  PLT 180   Cardiac Enzymes: No results for input(s): CKTOTAL, CKMB, CKMBINDEX, TROPONINI in the last 168 hours. CBG: No results for input(s): GLUCAP in the last 168 hours.  Iron Studies: No results for input(s): IRON, TIBC, TRANSFERRIN, FERRITIN in the last 72 hours. Studies/Results: US RENAL  Result Date: 01/05/2020 CLINICAL DATA:  54 year old male with acute on chronic renal failure. EXAM: RENAL / URINARY TRACT ULTRASOUND COMPLETE COMPARISON:  Renal ultrasound 11/14/2019. CT Abdomen and Pelvis 10/16/2019. FINDINGS: Right Kidney: Renal measurements: 10.8 x 3.4 x 4.9 cm (unchanged) = volume: 94 mL. Renal cortical echogenicity is stable since September, at the upper limits of normal to mildly increased. No right hydronephrosis or renal lesion. Left Kidney: Less well visualized today. Estimated renal measurements: 10.2 x 3.5 x 3.4 cm = volume: 63 mL. Left renal echogenicity stable and at the upper limits of normal (image 30). No left hydronephrosis or renal lesion. Bladder: Diminutive, unremarkable. Other: Ascites throughout the abdomen. Nodular, cirrhotic liver (  image 14). IMPRESSION: 1. No acute renal finding. Borderline to mildly echogenic kidneys compatible suspicious for medical renal disease. 2. Ascites.  Cirrhosis. Electronically Signed   By: Odessa Fleming M.D.   On: 01/05/2020 16:51   Medications: Infusions: . albumin human 25 g (01/06/20 0530)  . octreotide  (SANDOSTATIN)    IV infusion 50 mcg/hr (01/05/20 1857)    Scheduled Medications: . lactulose  20 g Oral BID  . midodrine  2.5 mg Oral BID WC  . pantoprazole  40 mg Oral BID  . rifaximin  550 mg Oral BID    have reviewed scheduled and prn medications.  Physical Exam: General: alert, NAD Heart: RRR Lungs: dec BS at bases due to inc abd  girth Abdomen: ascites-  Umbilical hernia Extremities: really minimal periph edema    01/06/2020,9:10 AM  LOS: 1 day

## 2020-01-06 NOTE — Progress Notes (Signed)
Spoke with Dr. Lovell Sheehan regarding rectal prolapse. Recommended trying to reduce it, but I was not successful with this. As the mucosa is pink, nothing to suggest necrosis, and patient is not obstructed, Dr. Lovell Sheehan recommended monitoring for now. Will likely reduce over time on its own as it has before.    Ermalinda Memos, PA-C [facility] 01/06/20

## 2020-01-06 NOTE — Progress Notes (Addendum)
Subjective: No BM today. Had 6 BMs yesterday as he had lactulose twice yesterday rather than once daily. Hasn't taken lactulose today. Had some blood per rectum yesterday when significant diarrhea. No further rectal bleeding today. Has been taking Xifaxan BID and lactulose once daily at home with 3 BMs daily. Feels mental status is at baseline. Abdomen is distended but no abdominal pain. No rectal pain or burning. Tolerating diet well.   Objective: Vital signs in last 24 hours: Temp:  [97.7 F (36.5 C)-98.3 F (36.8 C)] 98.3 F (36.8 C) (11/02 0346) Pulse Rate:  [82-121] 98 (11/02 0346) Resp:  [12-18] 18 (11/02 0201) BP: (94-123)/(49-87) 114/53 (11/02 0346) SpO2:  [97 %-100 %] 99 % (11/02 0346) Weight:  [71.6 kg] 71.6 kg (11/01 1143) Last BM Date: 01/05/20 General:   Alert and oriented, pleasant, chronically ill-appearing, cachectic. Head:  Normocephalic and atraumatic. Abdomen:  Bowel sounds present.  Abdomen is distended with moderately tense ascites and protruding umbilical hernia.  No tenderness to palpation.  Rectal: What appears to be rectal prolapse with mild tenderness. Appears to have good blood flow. Stool is light brown. Small amount of blood tinged seepage on underwear. Extremities:  With trace lower extremity edema. Neurologic:  Alert and  oriented x4;  grossly normal neurologically. Psych: Normal mood and affect.  Intake/Output from previous day: 11/01 0701 - 11/02 0700 In: 269.9 [I.V.:94.5; IV Piggyback:175.4] Out: 250 [Urine:250] Intake/Output this shift: No intake/output data recorded.  Lab Results: Recent Labs    01/05/20 1308  WBC 7.0  HGB 9.5*  HCT 26.9*  PLT 180   BMET Recent Labs    01/05/20 1308 01/06/20 0603  NA 121* 123*  K 4.2 4.3  CL 92* 94*  CO2 17* 17*  GLUCOSE 163* 120*  BUN 66* 63*  CREATININE 3.67* 3.57*  CALCIUM 8.9 9.3   LFT Recent Labs    01/05/20 1308  PROT 6.4*  ALBUMIN 3.5  AST 38  ALT 24  ALKPHOS 89  BILITOT  2.9*   PT/INR Recent Labs    01/05/20 1309  LABPROT 15.4*  INR 1.3*   Studies/Results: US RENAL  Result Date: 01/05/2020 CLINICAL DATA:  54 year old male with acute on chronic renal failure. EXAM: RENAL / URINARY TRACT ULTRASOUND COMPLETE COMPARISON:  Renal ultrasound 11/14/2019. CT Abdomen and Pelvis 10/16/2019. FINDINGS: Right Kidney: Renal measurements: 10.8 x 3.4 x 4.9 cm (unchanged) = volume: 94 mL. Renal cortical echogenicity is stable since September, at the upper limits of normal to mildly increased. No right hydronephrosis or renal lesion. Left Kidney: Less well visualized today. Estimated renal measurements: 10.2 x 3.5 x 3.4 cm = volume: 63 mL. Left renal echogenicity stable and at the upper limits of normal (image 30). No left hydronephrosis or renal lesion. Bladder: Diminutive, unremarkable. Other: Ascites throughout the abdomen. Nodular, cirrhotic liver (image 14). IMPRESSION: 1. No acute renal finding. Borderline to mildly echogenic kidneys compatible suspicious for medical renal disease. 2. Ascites.  Cirrhosis. Electronically Signed   By: Odessa Fleming M.D.   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 Upon review with patient, appears he had resumed diuretic therapy per Nephrology but was unable to tolerate this with worsening renal function with diuretics stopped 12/30/19.  Large volume paras (8 liters) weekly also contributing to overall decline. Nephrology is on board and guiding therapy.  He has been started on octreotide, albumin, and midodrine with creatinine stable/slightly improved today at 3.57.  Diuretics remain on hold.  We will plan to reach out to [facility] today to inform of patient's admission.  Currently, lack of insurance had impeded transplant evaluation, but he remains closely followed by Dr. Sharlene Motts at  [facility].  Hepatic encephalopathy: Ammonia elevated at 140 on admission, but clinically he is alert and oriented x4.  Mild asterixis on exam although he chronically has somewhat of a tremor.  Chronically on Xifaxan and lactulose at home.  We will continue Xifaxan twice daily and titrate lactulose to 3-4 BMs daily.  Ascites: He will need paracentesis at some point during this admission once kidney function has stabilized.  Moving forward, will need to limit paracentesis to no more than 4-5 L at a time along with IV albumin.  History of SBP: Continue on SBP prophylaxis in the way of Xifaxan due to limited antibiotic choices.  Rectal bleeding: Chronic history of intermittent low volume toilet tissue hematochezia with report of hemorrhoids. Had some rectal bleeding yesterday in the setting of significant diarrhea following the second dose of lactulose. No further rectal bleeding today.  No need for IV antibiotics at this time as this is chronic rather than an acute GI bleed. Notably on rectal exam, he appears to have rectal prolapse. Per patient this has been present for years and intermittently gets larger and other times will shrink back to normal with nothing protruding from his rectum. Small amount of blood tinged seepage on underwear. Will discuss rectal prolapse with Dr. Lovell Sheehan.   Plan: Repeat CBC today.  Repeat CMP and INR tomorrow.  Monitor for overt GI bleeding.  Appreciate Nephrology consultation.  Continue Xifaxan and lactulose dosing.  Will need to titrate lactulose to 3-4 BMs daily. Will reach out to [facility] to update them.  Para in future once stabilized.Will need to limit to 5 liters and will need IV albumin at time of para. Continue supportive measures.  Will continue to follow with you   LOS: 1 day    01/06/2020, 11:26 AM   Ermalinda Memos, PA-C [facility]  Addendum: Called to update Dr. Sharlene Motts with [facility]. Updated the nurse who will send message  to Dr. Sharlene Motts. Gave nurse my cell number and our office number for Dr. Sharlene Motts to contact me with any additional questions or recommendations.   Gastroenterology attending attestation note: Agree with the below findings as written. The patient was presented to me by the APP (Advanced Practice Provider) I personally reviewed the medical chart and evaluated the patient. I personally evaluated and examined the patient by myself.  I agree with Mrs. Gwendalyn Ege Harper's H/P and assessment.  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  He has remained asymptomatic otherwise.  Patient has required recurrent paracentesis which likely have contributed to deterioration of renal function.  Patient was started yesterday on midodrine, octreotide and received albumin IV for hepatorenal syndrome, which is supported by findings of low urinary sodium.  Nephrology is currently on board and agree with current diagnosis of hepatorenal syndrome, will need to continue current medications, fortunately creatinine has plateaued and may improve with medical management. Current meld score is 32. Unfortunately, the patient has no insurance coverage which  has affected his eligibility for liver transplant, will update [facility] transplant service regarding his current condition.  Overall long-term prognosis is poor.  Regarding his other manifestations of liver decompensation, the patient needs to continue with paracentesis as needed and will hold diuretics in the setting of hepatorenal syndrome, as well as SBP prophylaxis (on Xifaxan due to limited antibiotic options), as well as continuing lactulose for management of hepatic encephalopathy.  Notably, the patient was found to have rectal  prolapse but no presence of tenderness and is partially reducible.  Evaluated by Dr. Lovell Sheehan who considered no acute intervention is warranted at this point.  Katrinka Blazing, MD Gastroenterology and Hepatology [facility]

## 2020-01-06 NOTE — Progress Notes (Signed)
PROGRESS NOTE    Tamarick Kovalcik  VFI:[id number] DOB: [birth year] DOA: 01/05/2020 PCP: Patient, No Pcp Per    Chief Complaint  Patient presents with  . Abnormal Lab    Brief Narrative:  Degan Hanser is a 54 y.o. 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  Patient reports requiring likely increase in the amount of paracentesis secondary to worsening his ascites and initially increased use of diuretics to try to help control involvement; since Tuesday (12/29/19) diuretics were discontinued in the setting of worsening creatinine and electrolytes.  Patient reports that on evaluation and nephrology's office his repeat blood work were worse and he was advised to come to the hospital for further evaluation and management.  Patient reports no chest pain, no nausea, no vomiting, no hematuria, no dysuria, no abdominal pain, no hematemesis, some intermittent scant bright red blood in his stools after having multiple bowel movements due to the use of lactulose.  No fever, no shortness of breath, no focal weakness.  Appetite is decreased and he had general malaise.  Covid test negative.  ED Course: Hyponatremia, creatinine 3.67, BUN in the 70s range, hyperammonemia (ammonia 140) and positive metabolic acidosis.  Blood pressure systolic in the 90s on presentation.  Gastroenterologist and nephrology were consulted; TR[facility] has been called to place patient in the hospital for further evaluation and management of what appears to be hepatorenal syndrome.   Assessment & Plan: 1-acute on chronic renal failure -Appears to be secondary to hepatorenal syndrome and intravascular depletion -Patient with a stage IIIb at baseline -Slight improvement in creatinine and sodium level -Continue  treatment with midodrine, octreotide and albumin -Follow nephrology service recommendations.  Continue to avoid nephrotoxic agents, hypotension and large volume paracentesis.  2-end-stage liver cirrhosis/ascites -Continue low-sodium diet -Continue the use of Xifaxan and lactulose -Continue PPI -No signs for SBP currently -Continue to follow GI service recommendations. -Holding on paracentesis until renal function stabilized.  3-hyponatremia -In the setting of renal failure on chronic cirrhosis -Continue to follow electrolytes trend -Sodium trending up.  4-metabolic acidosis -Patient has been started on sodium bicarb -Continue to follow dry. -Follow nephrology service recommendations.  5-gastroesophageal flux disease -Continue PPI  6-moderate protein calorie malnutrition -In the setting of chronic cirrhosis -Follow recommendation for feeding supplement by dietitian.    DVT prophylaxis: SCDs. Code Status: Full code Family Communication: No family at bedside. Disposition:   Status is: Inpatient  Dispo: The patient is from: Home              Anticipated d/c is to: Home              Anticipated d/c date is: To be determined              Patient currently no medically stable for discharge.  Still with elevated creatinine level and hyponatremia.  Continue to follow gastroenterology service and nephrology service.  Continue the use of IV albumin, octreotide and midodrine.       Consultants:   Nephrology  -Gastroenterology service   Procedures:  Below for x-ray reports.   Antimicrobials:  None   Subjective: In no major distress.  Complaining of abdominal distention and tight sensation.  No requiring oxygen supplementation.  Denies chest pain, no nausea vomiting.  Objective: Vitals:   01/05/20 2044 01/06/20 0201 01/06/20 0346  01/06/20 1345  BP:  110/75 (!) 114/53 112/78  Pulse:  85 98 94  Resp: 15 18  17   Temp:  98.2 F (36.8 C) 98.3 F (36.8 C) 98.7 F  (37.1 C)  TempSrc:  Oral Oral Oral  SpO2: 100% 100% 99%   Weight:      Height:        Intake/Output Summary (Last 24 hours) at 01/06/2020 1844 Last data filed at 01/06/2020 1300 Gross per 24 hour  Intake 1229.92 ml  Output 250 ml  Net 979.92 ml   Filed Weights   01/05/20 1143  Weight: 71.6 kg    Examination:  General exam: Appears calm and comfortable; no fever.  Patient denies chest pain, nausea or vomiting.  Continue to have distended abdomen and tight sensation. Respiratory system: Decreased breath sounds at the bases; no wheezing, no crackles. Cardiovascular system: Regular rate and breathing; no rubs, no gallops, no JVD. Gastrointestinal system: Abdomen is distended, soft, with positive ascites.  Positive umbilical hernia appreciated.  No abdominal pain.  Positive bowel sounds.   Central nervous system: Alert and oriented. No focal neurological deficits. Extremities: No cyanosis or clubbing.  Trace pedal edema bilaterally. Skin: No cyanosis or clubbing. Psychiatry: Mood & affect appropriate.     Data Reviewed: I have personally reviewed following labs and imaging studies  CBC: Recent Labs  Lab 01/05/20 1308 01/06/20 1628  WBC 7.0 5.4  NEUTROABS 4.9  --   HGB 9.5* 7.5*  HCT 26.9* 21.8*  MCV 98.9 100.5*  PLT 180 130*    Basic Metabolic Panel: Recent Labs  Lab 01/05/20 1308 01/06/20 0603  NA 121* 123*  K 4.2 4.3  CL 92* 94*  CO2 17* 17*  GLUCOSE 163* 120*  BUN 66* 63*  CREATININE 3.67* 3.57*  CALCIUM 8.9 9.3    GFR: Estimated Creatinine Clearance: 23.4 mL/min (A) (by C-G formula based on SCr of 3.57 mg/dL (H)).  Liver Function Tests: Recent Labs  Lab 01/05/20 1308  AST 38  ALT 24  ALKPHOS 89  BILITOT 2.9*  PROT 6.4*  ALBUMIN 3.5    CBG: No results for input(s): GLUCAP in the last 168 hours.   Recent Results (from the past 240 hour(s))  Respiratory Panel by RT PCR (Flu A&B, Covid) - Nasopharyngeal Swab     Status: None   Collection  Time: 01/05/20 12:31 PM   Specimen: Nasopharyngeal Swab  Result Value Ref Range Status   SARS Coronavirus 2 by RT PCR NEGATIVE NEGATIVE Final    Comment: (NOTE) SARS-CoV-2 target nucleic acids are NOT DETECTED.  The SARS-CoV-2 RNA is generally detectable in upper respiratoy specimens during the acute phase of infection. The lowest concentration of SARS-CoV-2 viral copies this assay can detect is 131 copies/mL. A negative result does not preclude SARS-Cov-2 infection and should not be used as the sole basis for treatment or other patient management decisions. A negative result may occur with  improper specimen collection/handling, submission of specimen other than nasopharyngeal swab, presence of viral mutation(s) within the areas targeted by this assay, and inadequate number of viral copies (<131 copies/mL). A negative result must be combined with clinical observations, patient history, and epidemiological information. The expected result is Negative.  Fact Sheet for Patients:  13/01/21  Fact Sheet for Healthcare Providers:  https://www.moore.com/  This test is no t yet approved or cleared by the https://www.young.biz/ FDA and  has been authorized for detection and/or diagnosis of SARS-CoV-2 by FDA under an Emergency Use Authorization (  EUA). This EUA will remain  in effect (meaning this test can be used) for the duration of the COVID-19 declaration under Section 564(b)(1) of the Act, 21 U.S.C. section 360bbb-3(b)(1), unless the authorization is terminated or revoked sooner.     Influenza A by PCR NEGATIVE NEGATIVE Final   Influenza B by PCR NEGATIVE NEGATIVE Final    Comment: (NOTE) The Xpert Xpress SARS-CoV-2/FLU/RSV assay is intended as an aid in  the diagnosis of influenza from Nasopharyngeal swab specimens and  should not be used as a sole basis for treatment. Nasal washings and  aspirates are unacceptable for Xpert Xpress  SARS-CoV-2/FLU/RSV  testing.  Fact Sheet for Patients: https://www.moore.com/  Fact Sheet for Healthcare Providers: https://www.young.biz/  This test is not yet approved or cleared by the [place] FDA and  has been authorized for detection and/or diagnosis of SARS-CoV-2 by  FDA under an Emergency Use Authorization (EUA). This EUA will remain  in effect (meaning this test can be used) for the duration of the  Covid-19 declaration under Section 564(b)(1) of the Act, 21  U.S.C. section 360bbb-3(b)(1), unless the authorization is  terminated or revoked. Performed at [facility], [address], [place], [place] [address]      Radiology Studies: US RENAL  Result Date: 01/05/2020 CLINICAL DATA:  54 year old male with acute on chronic renal failure. EXAM: RENAL / URINARY TRACT ULTRASOUND COMPLETE COMPARISON:  Renal ultrasound 11/14/2019. CT Abdomen and Pelvis 10/16/2019. FINDINGS: Right Kidney: Renal measurements: 10.8 x 3.4 x 4.9 cm (unchanged) = volume: 94 mL. Renal cortical echogenicity is stable since September, at the upper limits of normal to mildly increased. No right hydronephrosis or renal lesion. Left Kidney: Less well visualized today. Estimated renal measurements: 10.2 x 3.5 x 3.4 cm = volume: 63 mL. Left renal echogenicity stable and at the upper limits of normal (image 30). No left hydronephrosis or renal lesion. Bladder: Diminutive, unremarkable. Other: Ascites throughout the abdomen. Nodular, cirrhotic liver (image 14). IMPRESSION: 1. No acute renal finding. Borderline to mildly echogenic kidneys compatible suspicious for medical renal disease. 2. Ascites.  Cirrhosis. Electronically Signed   By: Odessa Fleming M.D.   On: 01/05/2020 16:51    Scheduled Meds: . darbepoetin (ARANESP) injection - NON-DIALYSIS  60 mcg Subcutaneous Q Tue-1800  . lactulose  20 g Oral BID  . midodrine  10 mg Oral BID WC  . pantoprazole  40 mg Oral BID  . rifaximin   550 mg Oral BID  . sodium bicarbonate  650 mg Oral BID   Continuous Infusions: . octreotide  (SANDOSTATIN)    IV infusion 50 mcg/hr (01/06/20 1216)     LOS: 1 day    Time spent: 35 minutes   Vassie Loll, MD [facility]   To contact the attending provider between 7A-7P or the covering provider during after hours 7P-7A, please log into the web site [email or web address] and access using universal [place] password for that web site. If you do not have the password, please call the hospital operator.  01/06/2020, 6:44 PM

## 2020-01-07 ENCOUNTER — Encounter (HOSPITAL_COMMUNITY): Payer: Self-pay | Admitting: Internal Medicine

## 2020-01-07 ENCOUNTER — Inpatient Hospital Stay (HOSPITAL_COMMUNITY): Payer: Self-pay

## 2020-01-07 DIAGNOSIS — N179 Acute kidney failure, unspecified: Secondary | ICD-10-CM

## 2020-01-07 DIAGNOSIS — E871 Hypo-osmolality and hyponatremia: Secondary | ICD-10-CM

## 2020-01-07 DIAGNOSIS — K7460 Unspecified cirrhosis of liver: Secondary | ICD-10-CM

## 2020-01-07 DIAGNOSIS — K7200 Acute and subacute hepatic failure without coma: Secondary | ICD-10-CM

## 2020-01-07 LAB — FERRITIN: Ferritin: 526 ng/mL — ABNORMAL HIGH (ref 24–336)

## 2020-01-07 LAB — COMPREHENSIVE METABOLIC PANEL
ALT: 19 U/L (ref 0–44)
AST: 30 U/L (ref 15–41)
Albumin: 3.6 g/dL (ref 3.5–5.0)
Alkaline Phosphatase: 63 U/L (ref 38–126)
Anion gap: 11 (ref 5–15)
BUN: 54 mg/dL — ABNORMAL HIGH (ref 6–20)
CO2: 18 mmol/L — ABNORMAL LOW (ref 22–32)
Calcium: 8.7 mg/dL — ABNORMAL LOW (ref 8.9–10.3)
Chloride: 96 mmol/L — ABNORMAL LOW (ref 98–111)
Creatinine, Ser: 3.02 mg/dL — ABNORMAL HIGH (ref 0.61–1.24)
GFR, Estimated: 24 mL/min — ABNORMAL LOW (ref >60)
Glucose, Bld: 111 mg/dL — ABNORMAL HIGH (ref 70–99)
Potassium: 4 mmol/L (ref 3.5–5.1)
Sodium: 125 mmol/L — ABNORMAL LOW (ref 135–145)
Total Bilirubin: 2.8 mg/dL — ABNORMAL HIGH (ref 0.3–1.2)
Total Protein: 5.7 g/dL — ABNORMAL LOW (ref 6.5–8.1)

## 2020-01-07 LAB — CBC
HCT: 22 % — ABNORMAL LOW (ref 39.0–52.0)
Hemoglobin: 7.7 g/dL — ABNORMAL LOW (ref 13.0–17.0)
MCH: 34.8 pg — ABNORMAL HIGH (ref 26.0–34.0)
MCHC: 35 g/dL (ref 30.0–36.0)
MCV: 99.5 fL (ref 80.0–100.0)
Platelets: 120 10*3/uL — ABNORMAL LOW (ref 150–400)
RBC: 2.21 MIL/uL — ABNORMAL LOW (ref 4.22–5.81)
RDW: 13 % (ref 11.5–15.5)
WBC: 4.8 10*3/uL (ref 4.0–10.5)
nRBC: 0 % (ref 0.0–0.2)

## 2020-01-07 LAB — IRON AND TIBC
Iron: 83 ug/dL (ref 45–182)
Saturation Ratios: 52 % — ABNORMAL HIGH (ref 17.9–39.5)
TIBC: 160 ug/dL — ABNORMAL LOW (ref 250–450)
UIBC: 77 ug/dL

## 2020-01-07 LAB — RENAL FUNCTION PANEL
Albumin: 3.6 g/dL (ref 3.5–5.0)
Anion gap: 11 (ref 5–15)
BUN: 53 mg/dL — ABNORMAL HIGH (ref 6–20)
CO2: 17 mmol/L — ABNORMAL LOW (ref 22–32)
Calcium: 8.8 mg/dL — ABNORMAL LOW (ref 8.9–10.3)
Chloride: 97 mmol/L — ABNORMAL LOW (ref 98–111)
Creatinine, Ser: 3.05 mg/dL — ABNORMAL HIGH (ref 0.61–1.24)
GFR, Estimated: 23 mL/min — ABNORMAL LOW (ref >60)
Glucose, Bld: 111 mg/dL — ABNORMAL HIGH (ref 70–99)
Phosphorus: 3.9 mg/dL (ref 2.5–4.6)
Potassium: 4.1 mmol/L (ref 3.5–5.1)
Sodium: 125 mmol/L — ABNORMAL LOW (ref 135–145)

## 2020-01-07 LAB — PROTIME-INR
INR: 1.6 — ABNORMAL HIGH (ref 0.8–1.2)
Prothrombin Time: 18.3 seconds — ABNORMAL HIGH (ref 11.4–15.2)

## 2020-01-07 MED ORDER — MIDODRINE HCL 5 MG PO TABS
10.0000 mg | ORAL_TABLET | Freq: Three times a day (TID) | ORAL | Status: DC
  Administered 2020-01-07 – 2020-01-10 (×9): 10 mg via ORAL
  Filled 2020-01-07 (×8): qty 2

## 2020-01-07 MED ORDER — DARBEPOETIN ALFA 60 MCG/0.3ML IJ SOSY
60.0000 ug | PREFILLED_SYRINGE | INTRAMUSCULAR | Status: DC
  Administered 2020-01-07: 60 ug via SUBCUTANEOUS
  Filled 2020-01-07: qty 0.3

## 2020-01-07 MED ORDER — ALBUMIN HUMAN 25 % IV SOLN
25.0000 g | Freq: Once | INTRAVENOUS | Status: AC
  Administered 2020-01-07: 25 g via INTRAVENOUS
  Filled 2020-01-07: qty 100

## 2020-01-07 NOTE — Progress Notes (Signed)
Subjective:  Patient has no complaints. His wife is at bedside and states he feels better since he had fluid removed. He has been resting. He is eating 50% or less of his meals. No melena or rectal bleeding or abdominal pain reported.  Current Medications:  Current Facility-Administered Medications:  Marland Kitchen  Darbepoetin Alfa (ARANESP) injection 60 mcg, 60 mcg, Subcutaneous, Q Wed-1800, Barton Dubois, MD, 60 mcg at 01/07/20 1849 .  lactulose (CHRONULAC) 10 GM/15ML solution 20 g, 20 g, Oral, BID, Aliene Altes S, PA-C, 20 g at 01/06/20 1217 .  midodrine (PROAMATINE) tablet 10 mg, 10 mg, Oral, TID WC, Madelon Lips, MD, 10 mg at 01/07/20 1848 .  octreotide (SANDOSTATIN) 500 mcg in sodium chloride 0.9 % 250 mL (2 mcg/mL) infusion, 50 mcg/hr, Intravenous, Continuous, Barton Dubois, MD, Last Rate: 25 mL/hr at 01/07/20 0844, 50 mcg/hr at 01/07/20 0844 .  pantoprazole (PROTONIX) EC tablet 40 mg, 40 mg, Oral, BID, Barton Dubois, MD, 40 mg at 01/07/20 0845 .  prochlorperazine (COMPAZINE) injection 10 mg, 10 mg, Intravenous, Q6H PRN, Barton Dubois, MD, 10 mg at 01/07/20 1514 .  rifaximin (XIFAXAN) tablet 550 mg, 550 mg, Oral, BID, Barton Dubois, MD, 550 mg at 01/07/20 0845 .  sodium bicarbonate tablet 650 mg, 650 mg, Oral, BID, Corliss Parish, MD, 650 mg at 01/07/20 0845  Objective: Blood pressure (!) 91/56, pulse 79, temperature 98.1 F (36.7 C), temperature source Oral, resp. rate 16, height 5' 9"  (1.753 m), weight 71.6 kg, SpO2 100 %. Thin Caucasian male with generalized muscle wasting. Abdomen is distended but not tense. He has umbilical hernia which is partially reducible and not tender. No LE edema or clubbing noted.  Labs/studies Results:  CBC Latest Ref Rng & Units 01/07/2020 01/06/2020 01/05/2020  WBC 4.0 - 10.5 K/uL 4.8 5.4 7.0  Hemoglobin 13.0 - 17.0 g/dL 7.7(L) 7.5(L) 9.5(L)  Hematocrit 39 - 52 % 22.0(L) 21.8(L) 26.9(L)  Platelets 150 - 400 K/uL 120(L) 130(L) 180    CMP  Latest Ref Rng & Units 01/07/2020 01/07/2020 01/06/2020  Glucose 70 - 99 mg/dL 111(H) 111(H) 120(H)  BUN 6 - 20 mg/dL 53(H) 54(H) 63(H)  Creatinine 0.61 - 1.24 mg/dL 3.05(H) 3.02(H) 3.57(H)  Sodium 135 - 145 mmol/L 125(L) 125(L) 123(L)  Potassium 3.5 - 5.1 mmol/L 4.1 4.0 4.3  Chloride 98 - 111 mmol/L 97(L) 96(L) 94(L)  CO2 22 - 32 mmol/L 17(L) 18(L) 17(L)  Calcium 8.9 - 10.3 mg/dL 8.8(L) 8.7(L) 9.3  Total Protein 6.5 - 8.1 g/dL - 5.7(L) -  Total Bilirubin 0.3 - 1.2 mg/dL - 2.8(H) -  Alkaline Phos 38 - 126 U/L - 63 -  AST 15 - 41 U/L - 30 -  ALT 0 - 44 U/L - 19 -    Hepatic Function Latest Ref Rng & Units 01/07/2020 01/07/2020 01/05/2020  Total Protein 6.5 - 8.1 g/dL - 5.7(L) 6.4(L)  Albumin 3.5 - 5.0 g/dL 3.6 3.6 3.5  AST 15 - 41 U/L - 30 38  ALT 0 - 44 U/L - 19 24  Alk Phosphatase 38 - 126 U/L - 63 89  Total Bilirubin 0.3 - 1.2 mg/dL - 2.8(H) 2.9(H)  Bilirubin, Direct 0.0 - 0.2 mg/dL - - -     Ascitic fluid studies apparently not requested.  INR 1.6. Serum iron 83, TIBC 160 and saturation 52%. Serum ferritin 526.  Assessment:   Decompensated alcoholic cirrhosis with multiple complications which include tense ascites which was tapped earlier today with removal of 3 L of fluid. No  fluid analysis available. He also has hepatic encephalopathy severe muscle wasting/cachexia as well as kidney injury which is most likely due to hepatorenal syndrome. It is being managed with combination of midodrine octreotide and IV albumin. He also has hepatic encephalopathy. He is on Xifaxan and lactulose. Renal function is improving. Hopefully this trend will continue. Anemia and thrombocytopenia secondary to chronic disease and cirrhosis. No evidence of overt GI bleed. Iron studies consistent with anemia of chronic disease. Mildly elevated serum ferritin appears to be acute phase reactant.  Recommendations  Will continue current therapy. Consider ascitic fluid analysis and cultures at the time of next  abdominal tap.

## 2020-01-07 NOTE — Progress Notes (Signed)
Pt back from procedure at 1510. Denies c/o pain, does c/o nausea. B/P 80's systolic, HR 80's. Alert and oriented. MD Tat notified of c/o nausea and current B/P. Advised to proceed with ordered albumin infusion and monitor b/p. Compazine IV given per order for nausea, albumin started.

## 2020-01-07 NOTE — Sedation Documentation (Signed)
PT tolerated right sided paracentesis procedure well today and 3 Liters cloudy yellow fluid removed. PT transported back to inpatient room at this time.

## 2020-01-07 NOTE — Progress Notes (Addendum)
First bottle of albumin infused, second bottle started. Pt sleeping, awakens to name called. States nausea is better, just sleepy now. Current b/p 74/41 MAP 51). HR 75, SaO2 100% on room air, resp 16, even and non-labored.  MD Tat updated on current B/P. MD requested b/p check in opposite arm, completed with reading of 92/57. MD updated.

## 2020-01-07 NOTE — Procedures (Signed)
PreOperative Dx: Alcoholic cirrhosis, ascites Postoperative Dx: Alcoholic cirrhosis, ascites Procedure:   US guided paracentesis Radiologist:  Tyron Russell Anesthesia:  10 ml of1% lidocaine Specimen:  3 L of cloudy light yellow ascitic fluid EBL:   < 1 ml Complications: None

## 2020-01-07 NOTE — Progress Notes (Signed)
  [facility] Progress Note   Assessment/ Plan:   Assessment/ Plan: Pt is a 54 y.o. yo male with cirrhosis and previous HRS medically managed who was admitted on 01/05/2020 with AKI   1. Renal - AKI from recent baseline in the setting of possible volume depletion with diuretics vs looking like another flare of HRS.  Have held diuretics, started medical therapy with octreotide, albumin and midodrine.  Cr has plateaued.  Increase midodrine today as BP still a little on the soft side, continue octreotide. 2. HTN/vol-  Ascites but no longer with peripheral edema-  Diuretics on hold.  If paracentesis is desired would limit to 3L and would give 25 g IV albumin beforehand (although not technically an LVP will need to be conservative) 3. Hyponatremia-  Cirrhosis physiology and HRS-  essentially the same today 4. Cirrhosis -  MELD score in the 30's-  Had been trying at [facility] to get on list for transplant.  GI on board, appreciate assistance.  Lack of insurance is complicating things. 5. Acidosis-  On bicarb  6. Anemia-  Will check iron stores and add ESA  Subjective:    Taking pills this AM, declined lactulose.  For paracentesis today, no more than 3L.  Finished albumin, still on octreotide and midodrine.    Objective:   BP (!) 95/57 (BP Location: Right Arm)   Pulse 83   Temp 98.3 F (36.8 C) (Oral)   Resp 18   Ht 5\' 9"  (1.753 m)   Wt 71.6 kg   SpO2 100%   BMI 23.31 kg/m   Physical Exam: Gen: NAD, lying inbed HEENT: ++ temporal wasting CVS: RRR Resp: clear  Abd: distended, + Ascites Ext: no LE edema  Labs: BMET Recent Labs  Lab 01/05/20 1308 01/06/20 0603 01/07/20 0447  NA 121* 123* 125*  125*  K 4.2 4.3 4.1  4.0  CL 92* 94* 97*  96*  CO2 17* 17* 17*  18*  GLUCOSE 163* 120* 111*  111*  BUN 66* 63* 53*  54*  CREATININE 3.67* 3.57* 3.05*  3.02*  CALCIUM 8.9 9.3 8.8*  8.7*  PHOS  --   --  3.9   CBC Recent Labs  Lab 01/05/20 1308 01/06/20 1628  01/07/20 0447  WBC 7.0 5.4 4.8  NEUTROABS 4.9  --   --   HGB 9.5* 7.5* 7.7*  HCT 26.9* 21.8* 22.0*  MCV 98.9 100.5* 99.5  PLT 180 130* 120*      Medications:    . darbepoetin (ARANESP) injection - NON-DIALYSIS  60 mcg Subcutaneous Q Wed-1800  . lactulose  20 g Oral BID  . midodrine  10 mg Oral TID WC  . pantoprazole  40 mg Oral BID  . rifaximin  550 mg Oral BID  . sodium bicarbonate  650 mg Oral BID     13/03/21, MD 01/07/2020, 10:08 AM

## 2020-01-07 NOTE — Progress Notes (Signed)
PROGRESS NOTE  Alan Phillips TIR:[id number] DOB: 1966/02/15 DOA: 01/05/2020 PCP: Patient, No Pcp Per  Brief History:  54 y.o.malewith medical history significant ofend-stage cirrhosis due to alcohol, grade 1 esophageal varices, depression,chronic kidney disease a stage IIIbandhyponatremia;who presented to the emergency department as request of his nephrologist for worsening renal function. Patient reports requiring increase in the amount of paracentesis secondary to worsening his ascites and initially increased use of diuretics to try to help control involvement; since Tuesday (12/29/19)diuretics were discontinued in the setting of worsening creatinine and electrolytes after seeing his nephrologist. Patient reports that on evaluation and nephrology's office his repeat blood work were worse and he was advised to come to the hospital for further evaluation and management. Patient reports no chest pain, no nausea, no vomiting, no hematuria, no dysuria, no abdominal pain, no hematemesis, some intermittent scant bright red blood in his stools after having multiple bowel movements due to the use of lactulose. No fever, no shortness of breath, no focal weakness. Appetite is decreased and he had general malaise.   Assessment/Plan: acute on chronic renal failure--CKD 3b -secondary to hepatorenal syndrome and intravascular depletion -baseline creatinine 1.6-1.9 -last 2 paracenteses on 10/15 and 10/22 removed 8L each time -Continue treatment with midodrine, octreotide and albumin -Follow nephrology service recommendations.  Continue to avoid nephrotoxic agents, hypotension and large volume paracentesis -case discussed with renal, Dr. Signe Colt  Alcohol liver cirrhosis/ascites -Continue low-sodium diet -Continue Xifaxan and lactulose -Continue PPI -No signs for SBP currently -appreciate GI -send for paracentesis--Limit to 3L fluid removal -MELD =  32  Hyponatremia--hypervolemic -Chronic in the setting of renal failure on chronic cirrhosis -Continue to follow electrolytes trend  metabolic acidosis -Patient has been started on sodium bicarb -Continue to follow -Follow nephrology service recommendation  gastroesophageal flux disease -Continue PPI  moderate protein calorie malnutrition -In the setting of liver cirrhosis -Follow recommendation for feeding supplement by dietitian.      Status is: Inpatient  Remains inpatient appropriate because:IV treatments appropriate due to intensity of illness or inability to take PO   Dispo: The patient is from: Home              Anticipated d/c is to: Home              Anticipated d/c date is: 2 days              Patient currently is not medically stable to d/c.        Family Communication:   Spouse updated at bedside 11/3  Consultants:  GI, renal  Code Status:  FULL  DVT Prophylaxis:  SCDs   Procedures: As Listed in Progress Note Above  Antibiotics: None     Subjective: Patient denies fevers, chills, headache, chest pain, dyspnea, nausea, vomiting, diarrhea, abdominal pain, dysuria, hematuria, hematochezia, and melena.   Objective: Vitals:   01/06/20 0346 01/06/20 1345 01/06/20 2112 01/07/20 0725  BP: (!) 114/53 112/78 96/61 (!) 95/57  Pulse: 98 94 84 83  Resp:  17 17 18   Temp: 98.3 F (36.8 C) 98.7 F (37.1 C) 98.3 F (36.8 C) 98.3 F (36.8 C)  TempSrc: Oral Oral Oral Oral  SpO2: 99%  100% 100%  Weight:      Height:        Intake/Output Summary (Last 24 hours) at 01/07/2020 1356 Last data filed at 01/06/2020 2343 Gross per 24 hour  Intake --  Output 250 ml  Net -250 ml  Weight change:  Exam:   General:  Pt is alert, follows commands appropriately, not in acute distress  HEENT: No icterus, No thrush, No neck mass, Reading/AT  Cardiovascular: RRR, S1/S2, no rubs, no gallops  Respiratory: bibasilar rales. No wheeze  Abdomen:  Soft/+BS, non tender, mildly distended, no guarding  Extremities: 1+ LE edema, No lymphangitis, No petechiae, No rashes, no synovitis   Data Reviewed: I have personally reviewed following labs and imaging studies Basic Metabolic Panel: Recent Labs  Lab 01/05/20 1308 01/06/20 0603 01/07/20 0447  NA 121* 123* 125*  125*  K 4.2 4.3 4.1  4.0  CL 92* 94* 97*  96*  CO2 17* 17* 17*  18*  GLUCOSE 163* 120* 111*  111*  BUN 66* 63* 53*  54*  CREATININE 3.67* 3.57* 3.05*  3.02*  CALCIUM 8.9 9.3 8.8*  8.7*  PHOS  --   --  3.9   Liver Function Tests: Recent Labs  Lab 01/05/20 1308 01/07/20 0447  AST 38 30  ALT 24 19  ALKPHOS 89 63  BILITOT 2.9* 2.8*  PROT 6.4* 5.7*  ALBUMIN 3.5 3.6  3.6   No results for input(s): LIPASE, AMYLASE in the last 168 hours. Recent Labs  Lab 01/05/20 1308  AMMONIA 140*   Coagulation Profile: Recent Labs  Lab 01/05/20 1309 01/07/20 0447  INR 1.3* 1.6*   CBC: Recent Labs  Lab 01/05/20 1308 01/06/20 1628 01/07/20 0447  WBC 7.0 5.4 4.8  NEUTROABS 4.9  --   --   HGB 9.5* 7.5* 7.7*  HCT 26.9* 21.8* 22.0*  MCV 98.9 100.5* 99.5  PLT 180 130* 120*   Cardiac Enzymes: No results for input(s): CKTOTAL, CKMB, CKMBINDEX, TROPONINI in the last 168 hours. BNP: Invalid input(s): POCBNP CBG: No results for input(s): GLUCAP in the last 168 hours. HbA1C: No results for input(s): HGBA1C in the last 72 hours. Urine analysis:    Component Value Date/Time   COLORURINE YELLOW 01/05/2020 2246   APPEARANCEUR CLEAR 01/05/2020 2246   LABSPEC 1.017 01/05/2020 2246   PHURINE 5.0 01/05/2020 2246   GLUCOSEU NEGATIVE 01/05/2020 2246   HGBUR MODERATE (A) 01/05/2020 2246   BILIRUBINUR NEGATIVE 01/05/2020 2246   KETONESUR NEGATIVE 01/05/2020 2246   PROTEINUR NEGATIVE 01/05/2020 2246   NITRITE NEGATIVE 01/05/2020 2246   LEUKOCYTESUR NEGATIVE 01/05/2020 2246   Sepsis Labs: @LABRCNTIP (procalcitonin:4,lacticidven:4) ) Recent Results (from the past  240 hour(s))  Respiratory Panel by RT PCR (Flu A&B, Covid) - Nasopharyngeal Swab     Status: None   Collection Time: 01/05/20 12:31 PM   Specimen: Nasopharyngeal Swab  Result Value Ref Range Status   SARS Coronavirus 2 by RT PCR NEGATIVE NEGATIVE Final    Comment: (NOTE) SARS-CoV-2 target nucleic acids are NOT DETECTED.  The SARS-CoV-2 RNA is generally detectable in upper respiratoy specimens during the acute phase of infection. The lowest concentration of SARS-CoV-2 viral copies this assay can detect is 131 copies/mL. A negative result does not preclude SARS-Cov-2 infection and should not be used as the sole basis for treatment or other patient management decisions. A negative result may occur with  improper specimen collection/handling, submission of specimen other than nasopharyngeal swab, presence of viral mutation(s) within the areas targeted by this assay, and inadequate number of viral copies (<131 copies/mL). A negative result must be combined with clinical observations, patient history, and epidemiological information. The expected result is Negative.  Fact Sheet for Patients:  13/01/21  Fact Sheet for Healthcare Providers:  https://www.moore.com/  This test is  no t yet approved or cleared by the [place] FDA and  has been authorized for detection and/or diagnosis of SARS-CoV-2 by FDA under an Emergency Use Authorization (EUA). This EUA will remain  in effect (meaning this test can be used) for the duration of the COVID-19 declaration under Section 564(b)(1) of the Act, 21 U.S.C. section 360bbb-3(b)(1), unless the authorization is terminated or revoked sooner.     Influenza A by PCR NEGATIVE NEGATIVE Final   Influenza B by PCR NEGATIVE NEGATIVE Final    Comment: (NOTE) The Xpert Xpress SARS-CoV-2/FLU/RSV assay is intended as an aid in  the diagnosis of influenza from Nasopharyngeal swab specimens and  should  not be used as a sole basis for treatment. Nasal washings and  aspirates are unacceptable for Xpert Xpress SARS-CoV-2/FLU/RSV  testing.  Fact Sheet for Patients: https://www.moore.com/https://www.fda.gov/media/142436/download  Fact Sheet for Healthcare Providers: https://www.young.biz/https://www.fda.gov/media/142435/download  This test is not yet approved or cleared by the [place] FDA and  has been authorized for detection and/or diagnosis of SARS-CoV-2 by  FDA under an Emergency Use Authorization (EUA). This EUA will remain  in effect (meaning this test can be used) for the duration of the  Covid-19 declaration under Section 564(b)(1) of the Act, 21  U.S.C. section 360bbb-3(b)(1), unless the authorization is  terminated or revoked. Performed at [facility], [address] [place], [place] [address]      Scheduled Meds: . darbepoetin (ARANESP) injection - NON-DIALYSIS  60 mcg Subcutaneous Q Wed-1800  . lactulose  20 g Oral BID  . midodrine  10 mg Oral TID WC  . pantoprazole  40 mg Oral BID  . rifaximin  550 mg Oral BID  . sodium bicarbonate  650 mg Oral BID   Continuous Infusions: . octreotide  (SANDOSTATIN)    IV infusion 50 mcg/hr (01/07/20 0844)    Procedures/Studies: US RENAL  Result Date: 01/05/2020 CLINICAL DATA:  54 year old male with acute on chronic renal failure. EXAM: RENAL / URINARY TRACT ULTRASOUND COMPLETE COMPARISON:  Renal ultrasound 11/14/2019. CT Abdomen and Pelvis 10/16/2019. FINDINGS: Right Kidney: Renal measurements: 10.8 x 3.4 x 4.9 cm (unchanged) = volume: 94 mL. Renal cortical echogenicity is stable since September, at the upper limits of normal to mildly increased. No right hydronephrosis or renal lesion. Left Kidney: Less well visualized today. Estimated renal measurements: 10.2 x 3.5 x 3.4 cm = volume: 63 mL. Left renal echogenicity stable and at the upper limits of normal (image 30). No left hydronephrosis or renal lesion. Bladder: Diminutive, unremarkable. Other: Ascites throughout the  abdomen. Nodular, cirrhotic liver (image 14). IMPRESSION: 1. No acute renal finding. Borderline to mildly echogenic kidneys compatible suspicious for medical renal disease. 2. Ascites.  Cirrhosis. Electronically Signed   By: Odessa FlemingH  Hall M.D.   On: 01/05/2020 16:51   US Paracentesis  Result Date: 12/26/2019 INDICATION: Alcoholic cirrhosis, ascites EXAM: ULTRASOUND GUIDED DIAGNOSTIC AND THERAPEUTIC PARACENTESIS MEDICATIONS: None COMPLICATIONS: None immediate PROCEDURE: Informed written consent was obtained from the patient after a discussion of the risks, benefits and alternatives to treatment. A timeout was performed prior to the initiation of the procedure. Initial ultrasound scanning demonstrates a large amount of ascites within the right lower abdominal quadrant. The right lower abdomen was prepped and draped in the usual sterile fashion. 1% lidocaine was used for local anesthesia. Following this, a 5 [place] Yueh catheter was introduced. An ultrasound image was saved for documentation purposes. The paracentesis was performed. The catheter was removed and a dressing was applied. The patient  tolerated the procedure well without immediate post procedural complication. Patient received post-procedure intravenous albumin; see nursing notes for details. FINDINGS: A total of approximately 8 L of cloudy yellow fluid was removed. Samples were sent to the laboratory for requested analysis. IMPRESSION: Successful ultrasound-guided paracentesis yielding 8 liters of peritoneal fluid. Electronically Signed   By: Ulyses Southward M.D.   On: 12/26/2019 13:21   US Paracentesis  Result Date: 12/19/2019 INDICATION: Cirrhosis EXAM: ULTRASOUND GUIDED RIGHT ABDOMINAL PARACENTESIS MEDICATIONS: None COMPLICATIONS: None immediate. PROCEDURE: Informed written consent was obtained from the patient after a discussion of the risks, benefits and alternatives to treatment. A timeout was performed prior to the initiation of the procedure.  Initial ultrasound scanning demonstrates a large amount of ascites within the right lower abdominal quadrant. The right lower abdomen was prepped and draped in the usual sterile fashion. 1% lidocaine was used for local anesthesia. Following this, a 19 gauge, 7-cm, Yueh catheter was introduced. An ultrasound image was saved for documentation purposes. The paracentesis was performed. The catheter was removed and a dressing was applied. The patient tolerated the procedure well without immediate post procedural complication. Patient received post-procedure intravenous albumin; see nursing notes for details. FINDINGS: A total of approximately 8 L of cloudy, yellow fluid was removed. Samples were sent to the laboratory as requested by the clinical team. IMPRESSION: Successful ultrasound-guided paracentesis yielding 8 liters of peritoneal fluid. Electronically Signed   By: Jeronimo Greaves M.D.   On: 12/19/2019 15:50   US Paracentesis  Result Date: 12/12/2019 INDICATION: Alcoholic cirrhosis, recurrent ascites EXAM: ULTRASOUND GUIDED DIAGNOSTIC AND THERAPEUTIC PARACENTESIS MEDICATIONS: None COMPLICATIONS: None immediate PROCEDURE: Informed written consent was obtained from the patient after a discussion of the risks, benefits and alternatives to treatment. A timeout was performed prior to the initiation of the procedure. Initial ultrasound scanning demonstrates a large amount of ascites within the LEFT lower abdominal quadrant. The right lower abdomen was prepped and draped in the usual sterile fashion. 1% lidocaine was used for local anesthesia. Following this, a 5 French catheter was introduced. An ultrasound image was saved for documentation purposes. The paracentesis was performed. The catheter was removed and a dressing was applied. The patient tolerated the procedure well without immediate post procedural complication. Patient received post-procedure intravenous albumin; see nursing notes for details. FINDINGS: A  total of approximately 8 L of cloudy yellow ascitic fluid was removed. Samples were sent to the laboratory as requested by the clinical team. IMPRESSION: Successful ultrasound-guided paracentesis yielding 8 liters of peritoneal fluid. Electronically Signed   By: Ulyses Southward M.D.   On: 12/12/2019 15:15    Catarina Hartshorn, DO  [facility]  If 7PM-7AM, please contact night-coverage [email or web address] Password [email or web address] 01/07/2020, 1:56 PM   LOS: 2 days

## 2020-01-07 NOTE — Plan of Care (Signed)

## 2020-01-08 DIAGNOSIS — K7031 Alcoholic cirrhosis of liver with ascites: Secondary | ICD-10-CM

## 2020-01-08 LAB — PROTEIN ELECTRO, RANDOM URINE
Albumin ELP, Urine: 28 %
Alpha-1-Globulin, U: 6.9 %
Alpha-2-Globulin, U: 13 %
Beta Globulin, U: 35.8 %
Gamma Globulin, U: 16.2 %
Total Protein, Urine: 13.5 mg/dL

## 2020-01-08 LAB — RENAL FUNCTION PANEL
Albumin: 3.4 g/dL — ABNORMAL LOW (ref 3.5–5.0)
Anion gap: 9 (ref 5–15)
BUN: 50 mg/dL — ABNORMAL HIGH (ref 6–20)
CO2: 18 mmol/L — ABNORMAL LOW (ref 22–32)
Calcium: 8.5 mg/dL — ABNORMAL LOW (ref 8.9–10.3)
Chloride: 101 mmol/L (ref 98–111)
Creatinine, Ser: 2.56 mg/dL — ABNORMAL HIGH (ref 0.61–1.24)
GFR, Estimated: 29 mL/min — ABNORMAL LOW (ref >60)
Glucose, Bld: 106 mg/dL — ABNORMAL HIGH (ref 70–99)
Phosphorus: 3.7 mg/dL (ref 2.5–4.6)
Potassium: 3.7 mmol/L (ref 3.5–5.1)
Sodium: 128 mmol/L — ABNORMAL LOW (ref 135–145)

## 2020-01-08 LAB — PROTIME-INR
INR: 1.7 — ABNORMAL HIGH (ref 0.8–1.2)
Prothrombin Time: 19.1 seconds — ABNORMAL HIGH (ref 11.4–15.2)

## 2020-01-08 LAB — COMPREHENSIVE METABOLIC PANEL
ALT: 17 U/L (ref 0–44)
AST: 28 U/L (ref 15–41)
Albumin: 3.4 g/dL — ABNORMAL LOW (ref 3.5–5.0)
Alkaline Phosphatase: 53 U/L (ref 38–126)
Anion gap: 10 (ref 5–15)
BUN: 50 mg/dL — ABNORMAL HIGH (ref 6–20)
CO2: 18 mmol/L — ABNORMAL LOW (ref 22–32)
Calcium: 8.5 mg/dL — ABNORMAL LOW (ref 8.9–10.3)
Chloride: 100 mmol/L (ref 98–111)
Creatinine, Ser: 2.58 mg/dL — ABNORMAL HIGH (ref 0.61–1.24)
GFR, Estimated: 29 mL/min — ABNORMAL LOW (ref >60)
Glucose, Bld: 103 mg/dL — ABNORMAL HIGH (ref 70–99)
Potassium: 3.6 mmol/L (ref 3.5–5.1)
Sodium: 128 mmol/L — ABNORMAL LOW (ref 135–145)
Total Bilirubin: 2.9 mg/dL — ABNORMAL HIGH (ref 0.3–1.2)
Total Protein: 5.3 g/dL — ABNORMAL LOW (ref 6.5–8.1)

## 2020-01-08 LAB — CBC
HCT: 21.4 % — ABNORMAL LOW (ref 39.0–52.0)
Hemoglobin: 7.2 g/dL — ABNORMAL LOW (ref 13.0–17.0)
MCH: 33.6 pg (ref 26.0–34.0)
MCHC: 33.6 g/dL (ref 30.0–36.0)
MCV: 100 fL (ref 80.0–100.0)
Platelets: 100 10*3/uL — ABNORMAL LOW (ref 150–400)
RBC: 2.14 MIL/uL — ABNORMAL LOW (ref 4.22–5.81)
RDW: 13.1 % (ref 11.5–15.5)
WBC: 4.6 10*3/uL (ref 4.0–10.5)
nRBC: 0 % (ref 0.0–0.2)

## 2020-01-08 NOTE — Progress Notes (Signed)
PROGRESS NOTE  Alan Phillips WOE:[id number] DOB: [birth year] DOA: 01/05/2020 PCP: Patient, No Pcp Per   Brief History:  54 y.o.malewith medical history significant ofend-stage cirrhosis due to alcohol, grade 1 esophageal varices, depression,chronic kidney disease a stage IIIbandhyponatremia;who presented to the emergency department as request of his nephrologist for worsening renal function. Patient reports requiring increase in the amount of paracentesis secondary to worsening his ascites and initially increased use of diuretics to try to help control involvement; since Tuesday (12/29/19)diuretics were discontinued in the setting of worsening creatinine and electrolytes after seeing his nephrologist. Patient reports that on evaluation and nephrology's office his repeat blood work were worse and he was advised to come to the hospital for further evaluation and management. Patient reports no chest pain, no nausea, no vomiting, no hematuria, no dysuria, no abdominal pain, no hematemesis, some intermittent scant bright red blood in his stools after having multiple bowel movements due to the use of lactulose. No fever, no shortness of breath, no focal weakness. Appetite is decreased and he had general malaise.   Assessment/Plan: acute on chronic renal failure--CKD 3b -secondary to hepatorenal syndrome and intravascular depletion -baseline creatinine 1.6-1.9 -last 2 paracenteses on 10/15 and 10/22 removed 8L each time -treated with midodrine, octreotide and albumin during hospitalization -stopped octreotide am 11/4 -Follow nephrology service recommendations. Continue to avoid nephrotoxic agents, hypotension and large volume paracentesis -case discussed with renal, Dr. Signe Colt  Alcohol liver cirrhosis/ascites -Continue low-sodium diet -Continue Xifaxan and lactulose -Continue PPI -No signs for SBP currently -appreciate GI -11/3 paracentesis--Limit to 3L fluid  removal -MELD = 32 -attempt para again on 11/5  Hyponatremia--hypervolemic -Chronic in the setting of renal failure on chronic cirrhosis -Continue to follow electrolytes trend  metabolic acidosis -Patient has been started on sodium bicarb -Continue to follow -Follow nephrology service recommendation  gastroesophageal flux disease -Continue PPI  moderate protein calorie malnutrition -In the setting of liver cirrhosis -Follow recommendation for feeding supplement by dietitian.      Status is: Inpatient  Remains inpatient appropriate because:IV treatments appropriate due to intensity of illness or inability to take PO   Dispo: The patient is from: Home  Anticipated d/c is to: Home  Anticipated d/c date is: 2 days  Patient currently is not medically stable to d/c.        Family Communication:   Spouse updated at bedside 11/4  Consultants:  GI, renal  Code Status:  FULL  DVT Prophylaxis:  SCDs   Procedures: As Listed in Progress Note Above  Antibiotics: None    Subjective: Patient denies fevers, chills, headache, chest pain, dyspnea, nausea, vomiting, diarrhea, abdominal pain, dysuria, hematuria, hematochezia, and melena.   Objective: Vitals:   01/07/20 2154 01/08/20 0500 01/08/20 0805 01/08/20 1240  BP: (!) 91/53 (!) 82/55 (!) 94/54 (!) 92/59  Pulse: 84 70 77 76  Resp: 16 17 17 18   Temp: 98.1 F (36.7 C) 98.5 F (36.9 C) 98.1 F (36.7 C) 98.2 F (36.8 C)  TempSrc: Oral Oral Oral Oral  SpO2: 100% 100% 100% 100%  Weight:      Height:        Intake/Output Summary (Last 24 hours) at 01/08/2020 1416 Last data filed at 01/08/2020 0500 Gross per 24 hour  Intake 1165.81 ml  Output 400 ml  Net 765.81 ml   Weight change:  Exam:   General:  Pt is alert, follows commands appropriately, not in acute distress  HEENT: No icterus, No  thrush, No neck mass, [place]/AT  Cardiovascular: RRR,  S1/S2, no rubs, no gallops  Respiratory: bibasilar rales. No wheeze  Abdomen: Soft/+BS, mildly tender, non distended, no guarding  Extremities: No edema, No lymphangitis, No petechiae, No rashes, no synovitis   Data Reviewed: I have personally reviewed following labs and imaging studies Basic Metabolic Panel: Recent Labs  Lab 01/05/20 1308 01/06/20 0603 01/07/20 0447 01/08/20 0444  NA 121* 123* 125*  125* 128*  128*  K 4.2 4.3 4.1  4.0 3.6  3.7  CL 92* 94* 97*  96* 100  101  CO2 17* 17* 17*  18* 18*  18*  GLUCOSE 163* 120* 111*  111* 103*  106*  BUN 66* 63* 53*  54* 50*  50*  CREATININE 3.67* 3.57* 3.05*  3.02* 2.58*  2.56*  CALCIUM 8.9 9.3 8.8*  8.7* 8.5*  8.5*  PHOS  --   --  3.9 3.7   Liver Function Tests: Recent Labs  Lab 01/05/20 1308 01/07/20 0447 01/08/20 0444  AST 38 30 28  ALT 24 19 17   ALKPHOS 89 63 53  BILITOT 2.9* 2.8* 2.9*  PROT 6.4* 5.7* 5.3*  ALBUMIN 3.5 3.6  3.6 3.4*  3.4*   No results for input(s): LIPASE, AMYLASE in the last 168 hours. Recent Labs  Lab 01/05/20 1308  AMMONIA 140*   Coagulation Profile: Recent Labs  Lab 01/05/20 1309 01/07/20 0447 01/08/20 0444  INR 1.3* 1.6* 1.7*   CBC: Recent Labs  Lab 01/05/20 1308 01/06/20 1628 01/07/20 0447 01/08/20 0444  WBC 7.0 5.4 4.8 4.6  NEUTROABS 4.9  --   --   --   HGB 9.5* 7.5* 7.7* 7.2*  HCT 26.9* 21.8* 22.0* 21.4*  MCV 98.9 100.5* 99.5 100.0  PLT 180 130* 120* 100*   Cardiac Enzymes: No results for input(s): CKTOTAL, CKMB, CKMBINDEX, TROPONINI in the last 168 hours. BNP: Invalid input(s): POCBNP CBG: No results for input(s): GLUCAP in the last 168 hours. HbA1C: No results for input(s): HGBA1C in the last 72 hours. Urine analysis:    Component Value Date/Time   COLORURINE YELLOW 01/05/2020 2246   APPEARANCEUR CLEAR 01/05/2020 2246   LABSPEC 1.017 01/05/2020 2246   PHURINE 5.0 01/05/2020 2246   GLUCOSEU NEGATIVE 01/05/2020 2246   HGBUR MODERATE (A)  01/05/2020 2246   BILIRUBINUR NEGATIVE 01/05/2020 2246   KETONESUR NEGATIVE 01/05/2020 2246   PROTEINUR NEGATIVE 01/05/2020 2246   NITRITE NEGATIVE 01/05/2020 2246   LEUKOCYTESUR NEGATIVE 01/05/2020 2246   Sepsis Labs: @LABRCNTIP (procalcitonin:4,lacticidven:4) ) Recent Results (from the past 240 hour(s))  Respiratory Panel by RT PCR (Flu A&B, Covid) - Nasopharyngeal Swab     Status: None   Collection Time: 01/05/20 12:31 PM   Specimen: Nasopharyngeal Swab  Result Value Ref Range Status   SARS Coronavirus 2 by RT PCR NEGATIVE NEGATIVE Final    Comment: (NOTE) SARS-CoV-2 target nucleic acids are NOT DETECTED.  The SARS-CoV-2 RNA is generally detectable in upper respiratoy specimens during the acute phase of infection. The lowest concentration of SARS-CoV-2 viral copies this assay can detect is 131 copies/mL. A negative result does not preclude SARS-Cov-2 infection and should not be used as the sole basis for treatment or other patient management decisions. A negative result may occur with  improper specimen collection/handling, submission of specimen other than nasopharyngeal swab, presence of viral mutation(s) within the areas targeted by this assay, and inadequate number of viral copies (<131 copies/mL). A negative result must be combined with clinical observations, patient history, and  epidemiological information. The expected result is Negative.  Fact Sheet for Patients:  https://www.moore.com/  Fact Sheet for Healthcare Providers:  https://www.young.biz/  This test is no t yet approved or cleared by the [place] FDA and  has been authorized for detection and/or diagnosis of SARS-CoV-2 by FDA under an Emergency Use Authorization (EUA). This EUA will remain  in effect (meaning this test can be used) for the duration of the COVID-19 declaration under Section 564(b)(1) of the Act, 21 U.S.C. section 360bbb-3(b)(1), unless the  authorization is terminated or revoked sooner.     Influenza A by PCR NEGATIVE NEGATIVE Final   Influenza B by PCR NEGATIVE NEGATIVE Final    Comment: (NOTE) The Xpert Xpress SARS-CoV-2/FLU/RSV assay is intended as an aid in  the diagnosis of influenza from Nasopharyngeal swab specimens and  should not be used as a sole basis for treatment. Nasal washings and  aspirates are unacceptable for Xpert Xpress SARS-CoV-2/FLU/RSV  testing.  Fact Sheet for Patients: https://www.moore.com/  Fact Sheet for Healthcare Providers: https://www.young.biz/  This test is not yet approved or cleared by the [place] FDA and  has been authorized for detection and/or diagnosis of SARS-CoV-2 by  FDA under an Emergency Use Authorization (EUA). This EUA will remain  in effect (meaning this test can be used) for the duration of the  Covid-19 declaration under Section 564(b)(1) of the Act, 21  U.S.C. section 360bbb-3(b)(1), unless the authorization is  terminated or revoked. Performed at [facility], [address], [place], [place] [address]      Scheduled Meds: . darbepoetin (ARANESP) injection - NON-DIALYSIS  60 mcg Subcutaneous Q Wed-1800  . lactulose  20 g Oral BID  . midodrine  10 mg Oral TID WC  . pantoprazole  40 mg Oral BID  . rifaximin  550 mg Oral BID  . sodium bicarbonate  650 mg Oral BID   Continuous Infusions:  Procedures/Studies: US RENAL  Result Date: 01/05/2020 CLINICAL DATA:  54 year old male with acute on chronic renal failure. EXAM: RENAL / URINARY TRACT ULTRASOUND COMPLETE COMPARISON:  Renal ultrasound 11/14/2019. CT Abdomen and Pelvis 10/16/2019. FINDINGS: Right Kidney: Renal measurements: 10.8 x 3.4 x 4.9 cm (unchanged) = volume: 94 mL. Renal cortical echogenicity is stable since September, at the upper limits of normal to mildly increased. No right hydronephrosis or renal lesion. Left Kidney: Less well visualized today. Estimated  renal measurements: 10.2 x 3.5 x 3.4 cm = volume: 63 mL. Left renal echogenicity stable and at the upper limits of normal (image 30). No left hydronephrosis or renal lesion. Bladder: Diminutive, unremarkable. Other: Ascites throughout the abdomen. Nodular, cirrhotic liver (image 14). IMPRESSION: 1. No acute renal finding. Borderline to mildly echogenic kidneys compatible suspicious for medical renal disease. 2. Ascites.  Cirrhosis. Electronically Signed   By: Odessa Fleming M.D.   On: 01/05/2020 16:51   US Paracentesis  Result Date: 01/07/2020 INDICATION: Alcoholic cirrhosis, ascites EXAM: ULTRASOUND GUIDED THERAPEUTIC PARACENTESIS MEDICATIONS: None COMPLICATIONS: None immediate PROCEDURE: Informed written consent was obtained from the patient after a discussion of the risks, benefits and alternatives to treatment. A timeout was performed prior to the initiation of the procedure. Initial ultrasound scanning demonstrates a large amount of ascites within the right lower abdominal quadrant. The right lower abdomen was prepped and draped in the usual sterile fashion. 1% lidocaine was used for local anesthesia. Following this, a 5 [place] Yueh catheter was introduced. An ultrasound image was saved for documentation purposes. The paracentesis was performed. The catheter  was removed and a dressing was applied. The patient tolerated the procedure well without immediate post procedural complication. FINDINGS: A total of approximately 3 L of cloudy light yellow fluid was removed. IMPRESSION: Successful ultrasound-guided paracentesis yielding 3 L liters of peritoneal fluid. Electronically Signed   By: Ulyses SouthwardMark  Boles M.D.   On: 01/07/2020 16:16   US Paracentesis  Result Date: 12/26/2019 INDICATION: Alcoholic cirrhosis, ascites EXAM: ULTRASOUND GUIDED DIAGNOSTIC AND THERAPEUTIC PARACENTESIS MEDICATIONS: None COMPLICATIONS: None immediate PROCEDURE: Informed written consent was obtained from the patient after a discussion of the  risks, benefits and alternatives to treatment. A timeout was performed prior to the initiation of the procedure. Initial ultrasound scanning demonstrates a large amount of ascites within the right lower abdominal quadrant. The right lower abdomen was prepped and draped in the usual sterile fashion. 1% lidocaine was used for local anesthesia. Following this, a 5 [place] Yueh catheter was introduced. An ultrasound image was saved for documentation purposes. The paracentesis was performed. The catheter was removed and a dressing was applied. The patient tolerated the procedure well without immediate post procedural complication. Patient received post-procedure intravenous albumin; see nursing notes for details. FINDINGS: A total of approximately 8 L of cloudy yellow fluid was removed. Samples were sent to the laboratory for requested analysis. IMPRESSION: Successful ultrasound-guided paracentesis yielding 8 liters of peritoneal fluid. Electronically Signed   By: Ulyses SouthwardMark  Boles M.D.   On: 12/26/2019 13:21   US Paracentesis  Result Date: 12/19/2019 INDICATION: Cirrhosis EXAM: ULTRASOUND GUIDED RIGHT ABDOMINAL PARACENTESIS MEDICATIONS: None COMPLICATIONS: None immediate. PROCEDURE: Informed written consent was obtained from the patient after a discussion of the risks, benefits and alternatives to treatment. A timeout was performed prior to the initiation of the procedure. Initial ultrasound scanning demonstrates a large amount of ascites within the right lower abdominal quadrant. The right lower abdomen was prepped and draped in the usual sterile fashion. 1% lidocaine was used for local anesthesia. Following this, a 19 gauge, 7-cm, Yueh catheter was introduced. An ultrasound image was saved for documentation purposes. The paracentesis was performed. The catheter was removed and a dressing was applied. The patient tolerated the procedure well without immediate post procedural complication. Patient received post-procedure  intravenous albumin; see nursing notes for details. FINDINGS: A total of approximately 8 L of cloudy, yellow fluid was removed. Samples were sent to the laboratory as requested by the clinical team. IMPRESSION: Successful ultrasound-guided paracentesis yielding 8 liters of peritoneal fluid. Electronically Signed   By: Jeronimo GreavesKyle  Talbot M.D.   On: 12/19/2019 15:50   US Paracentesis  Result Date: 12/12/2019 INDICATION: Alcoholic cirrhosis, recurrent ascites EXAM: ULTRASOUND GUIDED DIAGNOSTIC AND THERAPEUTIC PARACENTESIS MEDICATIONS: None COMPLICATIONS: None immediate PROCEDURE: Informed written consent was obtained from the patient after a discussion of the risks, benefits and alternatives to treatment. A timeout was performed prior to the initiation of the procedure. Initial ultrasound scanning demonstrates a large amount of ascites within the LEFT lower abdominal quadrant. The right lower abdomen was prepped and draped in the usual sterile fashion. 1% lidocaine was used for local anesthesia. Following this, a 5 French catheter was introduced. An ultrasound image was saved for documentation purposes. The paracentesis was performed. The catheter was removed and a dressing was applied. The patient tolerated the procedure well without immediate post procedural complication. Patient received post-procedure intravenous albumin; see nursing notes for details. FINDINGS: A total of approximately 8 L of cloudy yellow ascitic fluid was removed. Samples were sent to the laboratory as requested by the clinical  team. IMPRESSION: Successful ultrasound-guided paracentesis yielding 8 liters of peritoneal fluid. Electronically Signed   By: Ulyses Southward M.D.   On: 12/12/2019 15:15    Catarina Hartshorn, DO  [facility]  If 7PM-7AM, please contact night-coverage [email or web address] Password [email or web address] 01/08/2020, 2:16 PM   LOS: 3 days

## 2020-01-08 NOTE — Plan of Care (Signed)

## 2020-01-08 NOTE — TOC Progression Note (Signed)
Transition of Care [facility]) - Progression Note    Patient Details  Name: Alan Phillips MRN: [id number] Date of Birth: 01-Nov-1965  Transition of Care [facility]) CM/SW Contact  Barry Brunner, LCSW Phone Number: 01/08/2020, 2:55 PM  Clinical Narrative:    Nephrology contacted CSW to inquire about coverage for the medication octreotide. CSW contacted [employer] to inquire about the med assistance program's ability to cover medication. [employer] reported that the med assist program is not able to cover the medication or a generic form of the medication. CSW notified nephrology of coverage status. TOC to follow.    Expected Discharge Plan: Home/Self Care Barriers to Discharge: Continued Medical Work up  Expected Discharge Plan and Services Expected Discharge Plan: Home/Self Care       Living arrangements for the past 2 months: Single Family Home                                       Social Determinants of Health (SDOH) Interventions    Readmission Risk Interventions Readmission Risk Prevention Plan 01/06/2020 11/16/2019  Transportation Screening Complete Complete  PCP or Specialist Appt within 3-5 Days Complete Complete  HRI or Home Care Consult Complete Complete  Social Work Consult for Recovery Care Planning/Counseling Complete Complete  Palliative Care Screening Not Applicable Not Applicable  Medication Review [employer]) Complete Complete

## 2020-01-08 NOTE — Progress Notes (Signed)
  [facility] Progress Note   Assessment/ Plan:   Assessment/ Plan: Pt is a 54 y.o. yo male with cirrhosis and previous HRS medically managed who was admitted on 01/05/2020 with AKI   1. AKI/ HRS - AKI from recent baseline in the setting of possible volume depletion with diuretics vs looking like another flare of HRS.  Have held diuretics.  Has had > 48 hrs of rx (nearing 72) with some improvement in Cr.  Discussed with pt and wife- will d/c octreotide today and see if he can maintain.  He is between insurances and this medication in [facility] form is cost-prohibitive.  I will also ask SW re: medication assistance in the event that he does really need it.  Continue midodrine 2. HTN/vol-  Ascites but no longer with peripheral edema-  Diuretics on hold.  S/p paracentesis 11/3, possibly another one 11/5? 3L and would give 25 g IV albumin beforehand (although not technically an LVP will need to be conservative) 3. Hyponatremia-  Cirrhosis physiology and HRS-  essentially the same today 4. Cirrhosis -  MELD score in the 30's-  Had been trying at [facility] to get on list for transplant.  GI on board, appreciate assistance.  Lack of insurance is complicating things. 5. Acidosis-  On bicarb  6. Anemia-  Will check iron stores and add ESA  Subjective:    Had paracentesis yesterday, 3L tap with albumin.  Cr down to 2.58 this AM, Na better.     Objective:   BP (!) 94/54 (BP Location: Right Arm)   Pulse 77   Temp 98.1 F (36.7 C) (Oral)   Resp 17   Ht 5\' 9"  (1.753 m)   Wt 71.6 kg   SpO2 100%   BMI 23.31 kg/m   Physical Exam: Gen: NAD, lying inbed HEENT: ++ temporal wasting CVS: RRR Resp: clear  Abd: distended, + Ascites, slightly better Ext: no LE edema  Labs: BMET Recent Labs  Lab 01/05/20 1308 01/06/20 0603 01/07/20 0447 01/08/20 0444  NA 121* 123* 125*  125* 128*  128*  K 4.2 4.3 4.1  4.0 3.6  3.7  CL 92* 94* 97*  96* 100  101  CO2 17* 17* 17*  18* 18*  18*   GLUCOSE 163* 120* 111*  111* 103*  106*  BUN 66* 63* 53*  54* 50*  50*  CREATININE 3.67* 3.57* 3.05*  3.02* 2.58*  2.56*  CALCIUM 8.9 9.3 8.8*  8.7* 8.5*  8.5*  PHOS  --   --  3.9 3.7   CBC Recent Labs  Lab 01/05/20 1308 01/06/20 1628 01/07/20 0447 01/08/20 0444  WBC 7.0 5.4 4.8 4.6  NEUTROABS 4.9  --   --   --   HGB 9.5* 7.5* 7.7* 7.2*  HCT 26.9* 21.8* 22.0* 21.4*  MCV 98.9 100.5* 99.5 100.0  PLT 180 130* 120* 100*      Medications:    . darbepoetin (ARANESP) injection - NON-DIALYSIS  60 mcg Subcutaneous Q Wed-1800  . lactulose  20 g Oral BID  . midodrine  10 mg Oral TID WC  . pantoprazole  40 mg Oral BID  . rifaximin  550 mg Oral BID  . sodium bicarbonate  650 mg Oral BID     13/04/21, MD 01/08/2020, 9:06 AM

## 2020-01-08 NOTE — Progress Notes (Signed)
Subjective:  Slept well. No abdominal pain. BM without melena, brbpr.   Objective: Vital signs in last 24 hours: Temp:  [98.1 F (36.7 C)-98.5 F (36.9 C)] 98.1 F (36.7 C) (11/04 0805) Pulse Rate:  [70-85] 77 (11/04 0805) Resp:  [16-18] 17 (11/04 0805) BP: (74-104)/(41-71) 94/54 (11/04 0805) SpO2:  [98 %-100 %] 100 % (11/04 0805) Last BM Date: 01/07/20 General:   Alert, cachetic WM in NAD Head:  Normocephalic and atraumatic. Eyes:  Sclera clear, no icterus.  Abdomen:  Soft,  Distended but not tense. NT. umb hernia present.   Normal bowel sounds, without guarding, and without rebound.   Extremities:  Without clubbing, deformity or edema. Neurologic:  Alert and  oriented x4;  grossly normal neurologically. Skin:  Intact without significant lesions or rashes. Psych:  Alert and cooperative. Normal mood and affect.  Intake/Output from previous day: 11/03 0701 - 11/04 0700 In: 1165.8 [P.O.:240; I.V.:925.8] Out: 400 [Urine:400] Intake/Output this shift: No intake/output data recorded.  Lab Results: CBC Recent Labs    01/06/20 1628 01/07/20 0447 01/08/20 0444  WBC 5.4 4.8 4.6  HGB 7.5* 7.7* 7.2*  HCT 21.8* 22.0* 21.4*  MCV 100.5* 99.5 100.0  PLT 130* 120* 100*   BMET Recent Labs    01/06/20 0603 01/07/20 0447 01/08/20 0444  NA 123* 125*  125* 128*  128*  K 4.3 4.1  4.0 3.6  3.7  CL 94* 97*  96* 100  101  CO2 17* 17*  18* 18*  18*  GLUCOSE 120* 111*  111* 103*  106*  BUN 63* 53*  54* 50*  50*  CREATININE 3.57* 3.05*  3.02* 2.58*  2.56*  CALCIUM 9.3 8.8*  8.7* 8.5*  8.5*   LFTs Recent Labs    01/05/20 1308 01/07/20 0447 01/08/20 0444  BILITOT 2.9* 2.8* 2.9*  ALKPHOS 89 63 53  AST 38 30 28  ALT 24 19 17   PROT 6.4* 5.7* 5.3*  ALBUMIN 3.5 3.6  3.6 3.4*  3.4*   No results for input(s): LIPASE in the last 72 hours. PT/INR Recent Labs    01/05/20 1309 01/07/20 0447 01/08/20 0444  LABPROT 15.4* 18.3* 19.1*  INR 1.3* 1.6* 1.7*       Imaging Studies: 13/04/21 RENAL  Result Date: 01/05/2020 CLINICAL DATA:  54 year old male with acute on chronic renal failure. EXAM: RENAL / URINARY TRACT ULTRASOUND COMPLETE COMPARISON:  Renal ultrasound 11/14/2019. CT Abdomen and Pelvis 10/16/2019. FINDINGS: Right Kidney: Renal measurements: 10.8 x 3.4 x 4.9 cm (unchanged) = volume: 94 mL. Renal cortical echogenicity is stable since September, at the upper limits of normal to mildly increased. No right hydronephrosis or renal lesion. Left Kidney: Less well visualized today. Estimated renal measurements: 10.2 x 3.5 x 3.4 cm = volume: 63 mL. Left renal echogenicity stable and at the upper limits of normal (image 30). No left hydronephrosis or renal lesion. Bladder: Diminutive, unremarkable. Other: Ascites throughout the abdomen. Nodular, cirrhotic liver (image 14). IMPRESSION: 1. No acute renal finding. Borderline to mildly echogenic kidneys compatible suspicious for medical renal disease. 2. Ascites.  Cirrhosis. Electronically Signed   By: October M.D.   On: 01/05/2020 16:51   13/03/2019 Paracentesis  Result Date: 01/07/2020 INDICATION: Alcoholic cirrhosis, ascites EXAM: ULTRASOUND GUIDED THERAPEUTIC PARACENTESIS MEDICATIONS: None COMPLICATIONS: None immediate PROCEDURE: Informed written consent was obtained from the patient after a discussion of the risks, benefits and alternatives to treatment. A timeout was performed prior to the initiation of the procedure. Initial ultrasound scanning demonstrates  a large amount of ascites within the right lower abdominal quadrant. The right lower abdomen was prepped and draped in the usual sterile fashion. 1% lidocaine was used for local anesthesia. Following this, a 5 [place] Yueh catheter was introduced. An ultrasound image was saved for documentation purposes. The paracentesis was performed. The catheter was removed and a dressing was applied. The patient tolerated the procedure well without immediate post procedural  complication. FINDINGS: A total of approximately 3 L of cloudy light yellow fluid was removed. IMPRESSION: Successful ultrasound-guided paracentesis yielding 3 L liters of peritoneal fluid. Electronically Signed   By: Ulyses Southward M.D.   On: 01/07/2020 16:16   US Paracentesis  Result Date: 12/26/2019 INDICATION: Alcoholic cirrhosis, ascites EXAM: ULTRASOUND GUIDED DIAGNOSTIC AND THERAPEUTIC PARACENTESIS MEDICATIONS: None COMPLICATIONS: None immediate PROCEDURE: Informed written consent was obtained from the patient after a discussion of the risks, benefits and alternatives to treatment. A timeout was performed prior to the initiation of the procedure. Initial ultrasound scanning demonstrates a large amount of ascites within the right lower abdominal quadrant. The right lower abdomen was prepped and draped in the usual sterile fashion. 1% lidocaine was used for local anesthesia. Following this, a 5 [place] Yueh catheter was introduced. An ultrasound image was saved for documentation purposes. The paracentesis was performed. The catheter was removed and a dressing was applied. The patient tolerated the procedure well without immediate post procedural complication. Patient received post-procedure intravenous albumin; see nursing notes for details. FINDINGS: A total of approximately 8 L of cloudy yellow fluid was removed. Samples were sent to the laboratory for requested analysis. IMPRESSION: Successful ultrasound-guided paracentesis yielding 8 liters of peritoneal fluid. Electronically Signed   By: Ulyses Southward M.D.   On: 12/26/2019 13:21   US Paracentesis  Result Date: 12/19/2019 INDICATION: Cirrhosis EXAM: ULTRASOUND GUIDED RIGHT ABDOMINAL PARACENTESIS MEDICATIONS: None COMPLICATIONS: None immediate. PROCEDURE: Informed written consent was obtained from the patient after a discussion of the risks, benefits and alternatives to treatment. A timeout was performed prior to the initiation of the procedure. Initial  ultrasound scanning demonstrates a large amount of ascites within the right lower abdominal quadrant. The right lower abdomen was prepped and draped in the usual sterile fashion. 1% lidocaine was used for local anesthesia. Following this, a 19 gauge, 7-cm, Yueh catheter was introduced. An ultrasound image was saved for documentation purposes. The paracentesis was performed. The catheter was removed and a dressing was applied. The patient tolerated the procedure well without immediate post procedural complication. Patient received post-procedure intravenous albumin; see nursing notes for details. FINDINGS: A total of approximately 8 L of cloudy, yellow fluid was removed. Samples were sent to the laboratory as requested by the clinical team. IMPRESSION: Successful ultrasound-guided paracentesis yielding 8 liters of peritoneal fluid. Electronically Signed   By: Jeronimo Greaves M.D.   On: 12/19/2019 15:50   US Paracentesis  Result Date: 12/12/2019 INDICATION: Alcoholic cirrhosis, recurrent ascites EXAM: ULTRASOUND GUIDED DIAGNOSTIC AND THERAPEUTIC PARACENTESIS MEDICATIONS: None COMPLICATIONS: None immediate PROCEDURE: Informed written consent was obtained from the patient after a discussion of the risks, benefits and alternatives to treatment. A timeout was performed prior to the initiation of the procedure. Initial ultrasound scanning demonstrates a large amount of ascites within the LEFT lower abdominal quadrant. The right lower abdomen was prepped and draped in the usual sterile fashion. 1% lidocaine was used for local anesthesia. Following this, a 5 French catheter was introduced. An ultrasound image was saved for documentation purposes. The paracentesis was performed. The  catheter was removed and a dressing was applied. The patient tolerated the procedure well without immediate post procedural complication. Patient received post-procedure intravenous albumin; see nursing notes for details. FINDINGS: A total of  approximately 8 L of cloudy yellow ascitic fluid was removed. Samples were sent to the laboratory as requested by the clinical team. IMPRESSION: Successful ultrasound-guided paracentesis yielding 8 liters of peritoneal fluid. Electronically Signed   By: Ulyses Southward M.D.   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 MELD Na 30 today. Has been evaluated at [facility] clinic, followed by Dr. Sharlene Motts, but lack of insurance has impeded transplant evaluation.  Acute kidney injury/hepatorenal syndrome: Appreciate nephrology assistance.  Diuretics on hold.  Some improvement in his creatinine.  Plans to DC octreotide today.  Patient on midodrine and IV albumin.  Hepatic encephalopathy: Ammonia elevated at 140 on admission but clinically alert and oriented.  Chronically on Xifaxan and lactulose at home.  Continue current regimen, goal of 3-4 bowel movements daily.  Ascites: 3 L of fluid removed yesterday, no fluid analysis available.  In need of paracentesis.  Per nephrology consider removal of 3 L and give 25 g of IV albumin before hand.  History of SBP: Continue on SBP prophylaxis in the way of Xifaxan due to limited antibiotic choices.  Rectal bleeding: Chronic intermittent low-volume toilet tissue hematochezia with reported hemorrhoids.  No need for IV antibiotics at this time.  On rectal exam he was noted to have rectal prolapse which patient reports he has had intermittently for years.  Plan: 1. Patient's wife calling today about SSI/Disability. 2. Future paras need to be limited to 3L with IV albumin (per nephrology). Reassess in AM for need for para. Send fluid for cell count and cultures on all taps. 3. Continue Xifaxan and lactulose dosing.  4. F/u labs in AM for MELD Na. 5. Continue 2 gram sodium  diet.  Leanna Battles. [facility]os [facility] [phone] 11/4/202111:57 AM     LOS: 3 days

## 2020-01-09 ENCOUNTER — Ambulatory Visit (HOSPITAL_COMMUNITY): Admission: RE | Admit: 2020-01-09 | Payer: Self-pay | Source: Ambulatory Visit

## 2020-01-09 ENCOUNTER — Encounter (HOSPITAL_COMMUNITY): Payer: Self-pay | Admitting: Internal Medicine

## 2020-01-09 ENCOUNTER — Ambulatory Visit: Payer: Self-pay | Admitting: Internal Medicine

## 2020-01-09 ENCOUNTER — Inpatient Hospital Stay (HOSPITAL_COMMUNITY): Payer: Self-pay

## 2020-01-09 DIAGNOSIS — E7220 Disorder of urea cycle metabolism, unspecified: Secondary | ICD-10-CM

## 2020-01-09 DIAGNOSIS — N1832 Chronic kidney disease, stage 3b: Secondary | ICD-10-CM

## 2020-01-09 DIAGNOSIS — E871 Hypo-osmolality and hyponatremia: Secondary | ICD-10-CM

## 2020-01-09 LAB — RENAL FUNCTION PANEL
Albumin: 3.4 g/dL — ABNORMAL LOW (ref 3.5–5.0)
Anion gap: 10 (ref 5–15)
BUN: 42 mg/dL — ABNORMAL HIGH (ref 6–20)
CO2: 20 mmol/L — ABNORMAL LOW (ref 22–32)
Calcium: 8.6 mg/dL — ABNORMAL LOW (ref 8.9–10.3)
Chloride: 99 mmol/L (ref 98–111)
Creatinine, Ser: 2.23 mg/dL — ABNORMAL HIGH (ref 0.61–1.24)
GFR, Estimated: 34 mL/min — ABNORMAL LOW (ref >60)
Glucose, Bld: 103 mg/dL — ABNORMAL HIGH (ref 70–99)
Phosphorus: 3.2 mg/dL (ref 2.5–4.6)
Potassium: 3.6 mmol/L (ref 3.5–5.1)
Sodium: 129 mmol/L — ABNORMAL LOW (ref 135–145)

## 2020-01-09 LAB — CBC
HCT: 22 % — ABNORMAL LOW (ref 39.0–52.0)
Hemoglobin: 7.5 g/dL — ABNORMAL LOW (ref 13.0–17.0)
MCH: 34.6 pg — ABNORMAL HIGH (ref 26.0–34.0)
MCHC: 34.1 g/dL (ref 30.0–36.0)
MCV: 101.4 fL — ABNORMAL HIGH (ref 80.0–100.0)
Platelets: 107 10*3/uL — ABNORMAL LOW (ref 150–400)
RBC: 2.17 MIL/uL — ABNORMAL LOW (ref 4.22–5.81)
RDW: 13.2 % (ref 11.5–15.5)
WBC: 5.6 10*3/uL (ref 4.0–10.5)
nRBC: 0 % (ref 0.0–0.2)

## 2020-01-09 LAB — HEPATIC FUNCTION PANEL
ALT: 18 U/L (ref 0–44)
AST: 31 U/L (ref 15–41)
Albumin: 3.4 g/dL — ABNORMAL LOW (ref 3.5–5.0)
Alkaline Phosphatase: 54 U/L (ref 38–126)
Bilirubin, Direct: 0.6 mg/dL — ABNORMAL HIGH (ref 0.0–0.2)
Indirect Bilirubin: 2.7 mg/dL — ABNORMAL HIGH (ref 0.3–0.9)
Total Bilirubin: 3.3 mg/dL — ABNORMAL HIGH (ref 0.3–1.2)
Total Protein: 5.4 g/dL — ABNORMAL LOW (ref 6.5–8.1)

## 2020-01-09 LAB — PROTIME-INR
INR: 1.7 — ABNORMAL HIGH (ref 0.8–1.2)
Prothrombin Time: 19.1 seconds — ABNORMAL HIGH (ref 11.4–15.2)

## 2020-01-09 LAB — AMMONIA: Ammonia: 79 umol/L — ABNORMAL HIGH (ref 9–35)

## 2020-01-09 MED ORDER — ALBUMIN HUMAN 25 % IV SOLN
25.0000 g | Freq: Once | INTRAVENOUS | Status: AC
  Administered 2020-01-09: 25 g via INTRAVENOUS
  Filled 2020-01-09: qty 100

## 2020-01-09 NOTE — Progress Notes (Signed)
Subjective: Feels ok today, abdominal pain improved after paracentesis. He was under the impression they didn't want to give more Albumin. Denies N/V. Had a bowel movement today with no hematochezia or melena. No other GI complaints.  Objective: Vital signs in last 24 hours: Temp:  [98 F (36.7 C)-99 F (37.2 C)] 98 F (36.7 C) (11/05 1314) Pulse Rate:  [63-79] 63 (11/05 1314) Resp:  [16-20] 20 (11/05 1314) BP: (85-99)/(47-68) 93/63 (11/05 1314) SpO2:  [98 %-100 %] 100 % (11/05 1314) Last BM Date: 01/07/20 General:   Alert and oriented, pleasant. Somewhat sleepy but awakens to voice. Head:  Normocephalic and atraumatic. Eyes:  No icterus, sclera clear. Conjuctiva pink.  Heart:  S1, S2 present, no murmurs noted.  Lungs: Clear to auscultation bilaterally, without wheezing, rales, or rhonchi.  Abdomen:  Bowel sounds present, soft, non-tender, non-distended. No HSM or hernias noted. No rebound or guarding. No masses appreciated  Msk:  Symmetrical without gross deformities. Pulses:  Normal bilateral DP pulses noted. Extremities:  Without clubbing or edema. Neurologic:  Alert and  oriented x4;  grossly normal neurologically. Psych:  Alert and cooperative. Normal mood and affect.  Intake/Output from previous day: 11/04 0701 - 11/05 0700 In: 1090.2 [P.O.:960; I.V.:130.2] Out: 200 [Urine:200] Intake/Output this shift: Total I/O In: 480 [P.O.:480] Out: 200 [Urine:200]  Lab Results: Recent Labs    01/07/20 0447 01/08/20 0444 01/09/20 0635  WBC 4.8 4.6 5.6  HGB 7.7* 7.2* 7.5*  HCT 22.0* 21.4* 22.0*  PLT 120* 100* 107*   BMET Recent Labs    01/07/20 0447 01/08/20 0444 01/09/20 0635  NA 125*  125* 128*  128* 129*  K 4.1  4.0 3.6  3.7 3.6  CL 97*  96* 100  101 99  CO2 17*  18* 18*  18* 20*  GLUCOSE 111*  111* 103*  106* 103*  BUN 53*  54* 50*  50* 42*  CREATININE 3.05*  3.02* 2.58*  2.56* 2.23*  CALCIUM 8.8*  8.7* 8.5*  8.5* 8.6*   LFT Recent Labs     01/07/20 0447 01/08/20 0444 01/09/20 0635  PROT 5.7* 5.3* 5.4*  ALBUMIN 3.6  3.6 3.4*  3.4* 3.4*  3.4*  AST 30 28 31   ALT 19 17 18   ALKPHOS 63 53 54  BILITOT 2.8* 2.9* 3.3*  BILIDIR  --   --  0.6*  IBILI  --   --  2.7*   PT/INR Recent Labs    01/08/20 0444 01/09/20 0635  LABPROT 19.1* 19.1*  INR 1.7* 1.7*   Hepatitis Panel No results for input(s): HEPBSAG, HCVAB, HEPAIGM, HEPBIGM in the last 72 hours.   Studies/Results: 13/04/21 Paracentesis  Result Date: 01/09/2020 INDICATION: Alcoholic cirrhosis, ascites EXAM: ULTRASOUND GUIDED THERAPEUTIC PARACENTESIS MEDICATIONS: None COMPLICATIONS: None immediate PROCEDURE: Informed written consent was obtained from the patient after a discussion of the risks, benefits and alternatives to treatment. A timeout was performed prior to the initiation of the procedure. Initial ultrasound scanning demonstrates a large amount of ascites within the right lower abdominal quadrant. The right lower abdomen was prepped and draped in the usual sterile fashion. 1% lidocaine was used for local anesthesia. Following this, a 5 [place] Yueh catheter was introduced. An ultrasound image was saved for documentation purposes. The paracentesis was performed. The catheter was removed and a dressing was applied. The patient tolerated the procedure well without immediate post procedural complication. FINDINGS: A total of approximately 3.0 L of cloudy yellow ascitic fluid was removed. IMPRESSION: Successful  ultrasound-guided paracentesis yielding 3.0 L liters of peritoneal fluid. Electronically Signed   By: Ulyses Southward M.D.   On: 01/09/2020 11:58   US Paracentesis  Result Date: 01/07/2020 INDICATION: Alcoholic cirrhosis, ascites EXAM: ULTRASOUND GUIDED THERAPEUTIC PARACENTESIS MEDICATIONS: None COMPLICATIONS: None immediate PROCEDURE: Informed written consent was obtained from the patient after a discussion of the risks, benefits and alternatives to treatment. A timeout  was performed prior to the initiation of the procedure. Initial ultrasound scanning demonstrates a large amount of ascites within the right lower abdominal quadrant. The right lower abdomen was prepped and draped in the usual sterile fashion. 1% lidocaine was used for local anesthesia. Following this, a 5 [place] Yueh catheter was introduced. An ultrasound image was saved for documentation purposes. The paracentesis was performed. The catheter was removed and a dressing was applied. The patient tolerated the procedure well without immediate post procedural complication. FINDINGS: A total of approximately 3 L of cloudy light yellow fluid was removed. IMPRESSION: Successful ultrasound-guided paracentesis yielding 3 L liters of peritoneal fluid. Electronically Signed   By: Ulyses Southward M.D.   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 MELD Na 30 yesterday and 29 today. Has been evaluated at [facility] clinic, followed by Dr. Sharlene Motts, but lack of insurance has impeded transplant evaluation.  Acute kidney injury/hepatorenal syndrome: Appreciate nephrology assistance.  Diuretics on hold.  Some improvement in his creatinine (2.23 today).  Octreotide d/c yesterday.  Patient on midodrine. Was on IV Albumin but do not see dose today or yesterday. Wasn't given a dose prior to paracentesis today.  Hepatic encephalopathy: Ammonia elevated at 140 on admission but clinically alert and oriented.  Chronically on Xifaxan and lactulose at home.  Continue current regimen, goal of 3-4 bowel movements daily.  Ascites: 3 L of fluid removed two days ago, no fluid analysis available.  In need of paracentesis.  Per nephrology consider removal of 3 L and give 25 g of IV albumin before hand. Went for paracentesis today with 3L removed; Chari Manning, RN stated  the patient told her he was NOT supposed to get IV Albumin. Review of nephrology note yesterday indicated max 3L WITH IV Albumin 25g beforehand. States they BARELY got 3L out.  History of SBP: Continue on SBP prophylaxis in the way of Xifaxan due to limited antibiotic choices.  Rectal bleeding: Chronic intermittent low-volume toilet tissue hematochezia with reported hemorrhoids.  No need for IV antibiotics at this time.  On rectal exam he was noted to have rectal prolapse which patient reports he has had intermittently for years.   Plan: 1. Continue attempts for SSI/disability 2. Limit future paras to 3L and GIVE Albumin 25 g prior (per nephrology) 3. Check for significant ascites volume prior to considering para and aim for conservative use of paracentesis 4. Continue Xifaxan and lactulose 5. Repeat HFP and INR in the morning 6. 2g sodium diet 7. Give 25g IV Albumin now 8. Supportive measures   Thank you for allowing [place] to participate in the care of [facility]  Wynne Dust, DNP, AGNP-C Adult & Gerontological Nurse Practitioner [facility]    LOS: 4 days    01/09/2020, 2:30 PM

## 2020-01-09 NOTE — Procedures (Signed)
PreOperative Dx: Alcoholic cirrhosis, ascites Postoperative Dx: Alcoholic cirrhosis, ascites Procedure:   US guided paracentesis Radiologist:  Tyron Russell Anesthesia:  10 ml of1% lidocaine Specimen:  3.0 L of his typical cloudy yellow ascitic fluid EBL:   < 1 ml Complications: None

## 2020-01-09 NOTE — Plan of Care (Signed)
  Problem: Education: Goal: Knowledge of General Education information will improve Description Including pain rating scale, medication(s)/side effects and non-pharmacologic comfort measures Outcome: Progressing   Problem: Health Behavior/Discharge Planning: Goal: Ability to manage health-related needs will improve Outcome: Progressing   

## 2020-01-09 NOTE — Progress Notes (Signed)
  [facility] Progress Note   Assessment/ Plan:   Assessment/ Plan: Pt is a 54 y.o. yo male with cirrhosis and previous HRS medically managed who was admitted on 01/05/2020 with AKI   1. AKI/ HRS - AKI from recent baseline in the setting of possible volume depletion with diuretics vs looking like another flare of HRS.  Have held diuretics.  Has had > 48 hrs of rx (nearing 72) with some improvement in Cr.  Stopped octreotide 11/4, [place] formulation not covered by medmatch/ care connect and is otherwise cost-prohibitive.  Continue midodrine 2. HTN/vol-  Ascites but no longer with peripheral edema-  Diuretics on hold.  S/p paracentesis 11/3, possibly another one 11/5? 3L and would give 25 g IV albumin beforehand (although not technically an LVP will need to be conservative) 3. Hyponatremia-  Cirrhosis physiology and HRS-  essentially the same today 4. Cirrhosis -  MELD score in the 30's-  Had been trying at [facility] to get on list for transplant.  GI on board, appreciate assistance.  Lack of insurance is complicating things. 5. Acidosis-  On bicarb  6. Anemia-  iron OK, getting ESA  Subjective:    Cr better today even off octreotide.  No complaints this AM   Objective:   BP (!) 97/59 (BP Location: Right Arm)   Pulse 71   Temp 98.3 F (36.8 C) (Oral)   Resp 16   Ht 5\' 9"  (1.753 m)   Wt 71.6 kg   SpO2 100%   BMI 23.31 kg/m   Physical Exam: Gen: NAD, lying inbed HEENT: ++ temporal wasting CVS: RRR Resp: clear  Abd: distended, + Ascites, slightly better Ext: no LE edema  Labs: BMET Recent Labs  Lab 01/05/20 1308 01/06/20 0603 01/07/20 0447 01/08/20 0444 01/09/20 0635  NA 121* 123* 125*  125* 128*  128* 129*  K 4.2 4.3 4.1  4.0 3.6  3.7 3.6  CL 92* 94* 97*  96* 100  101 99  CO2 17* 17* 17*  18* 18*  18* 20*  GLUCOSE 163* 120* 111*  111* 103*  106* 103*  BUN 66* 63* 53*  54* 50*  50* 42*  CREATININE 3.67* 3.57* 3.05*  3.02* 2.58*  2.56* 2.23*   CALCIUM 8.9 9.3 8.8*  8.7* 8.5*  8.5* 8.6*  PHOS  --   --  3.9 3.7 3.2   CBC Recent Labs  Lab 01/05/20 1308 01/05/20 1308 01/06/20 1628 01/07/20 0447 01/08/20 0444 01/09/20 0635  WBC 7.0   < > 5.4 4.8 4.6 5.6  NEUTROABS 4.9  --   --   --   --   --   HGB 9.5*   < > 7.5* 7.7* 7.2* 7.5*  HCT 26.9*   < > 21.8* 22.0* 21.4* 22.0*  MCV 98.9   < > 100.5* 99.5 100.0 101.4*  PLT 180   < > 130* 120* 100* 107*   < > = values in this interval not displayed.      Medications:    . darbepoetin (ARANESP) injection - NON-DIALYSIS  60 mcg Subcutaneous Q Wed-1800  . lactulose  20 g Oral BID  . midodrine  10 mg Oral TID WC  . pantoprazole  40 mg Oral BID  . rifaximin  550 mg Oral BID  . sodium bicarbonate  650 mg Oral BID     13/05/21, MD 01/09/2020, 10:23 AM

## 2020-01-09 NOTE — Sedation Documentation (Signed)
PT tolerated right sided paracentesis procedure well today and 3 Liters cloudy yellow fluid removed. Vital signs stable and no acute distress voiced or noticed. PT transported back to inpatient room on the 3rd floor at this time.

## 2020-01-09 NOTE — Progress Notes (Signed)
PROGRESS NOTE  Alan Phillips PIR:[id number] DOB: 1965/06/24 DOA: 01/05/2020 PCP: Patient, No Pcp Per  Brief History: 54 y.o.malewith medical history significant ofend-stage cirrhosis due to alcohol, grade 1 esophageal varices, depression,chronic kidney disease a stage IIIbandhyponatremia;who presented to the emergency department as request of his nephrologist for worsening renal function. Patient reports requiring increase in the amount of paracentesis secondary to worsening his ascites and initially increased use of diuretics to try to help control involvement; since Tuesday (12/29/19)diuretics were discontinued in the setting of worsening creatinine and electrolytesafter seeing his nephrologist. Patient reports that on evaluation and nephrology's office his repeat blood work were worse and he was advised to come to the hospital for further evaluation and management. Patient reports no chest pain, no nausea, no vomiting, no hematuria, no dysuria, no abdominal pain, no hematemesis, some intermittent scant bright red blood in his stools after having multiple bowel movements due to the use of lactulose. No fever, no shortness of breath, no focal weakness. Appetite is decreased and he had general malaise.   Assessment/Plan: acute on chronic renal failure--CKD 3b -secondary to hepatorenal syndrome and intravascular depletion -baseline creatinine 1.6-1.9 -last 2 paracenteses on 10/15 and 10/22 removed 8L each time -treated with midodrine, octreotide and albumin during hospitalization -stopped octreotide am 11/4 -Follow nephrology service recommendations. Continue to avoid nephrotoxic agents, hypotension and large volume paracentesis -case discussed with renal, Dr. Mckinley Jewel cirrhosis/ascites -Continue low-sodium diet -Continue Xifaxan and lactulose -Continue PPI -No signs for SBP currently -appreciate GI -11/3 paracentesis--Limit to 3L fluid removal -MELD  = 32 -11/5 paracetnesis--3L removed  Hyponatremia--hypervolemic -Chronic inthe setting of renal failure on chronic cirrhosis -Continue to follow electrolytes trend  metabolic acidosis -Patient has been started on sodium bicarb -Continue to follow -Follow nephrology service recommendation  gastroesophageal flux disease -Continue PPI  moderate protein calorie malnutrition -In the setting oflivercirrhosis -Follow recommendation for feeding supplement by dietitian.      Status is: Inpatient  Remains inpatient appropriate because:IV treatments appropriate due to intensity of illness or inability to take PO   Dispo: The patient is from:Home Anticipated d/c is YT:[facility] Anticipated d/c date is: 2 days Patient currently is not medically stable to d/c.        Family Communication:Spouse updatedat bedside 11/4  Consultants:GI, renal  Code Status: FULL  DVT Prophylaxis: SCDs   Procedures: As Listed in Progress Note Above  Antibiotics: None        Subjective: Patient denies fevers, chills, headache, chest pain, dyspnea, nausea, vomiting, diarrhea, abdominal pain, dysuria, hematuria, hematochezia   Objective: Vitals:   01/09/20 1043 01/09/20 1106 01/09/20 1126 01/09/20 1314  BP: (!) 93/63  Pulse: 72 74 68 63  Resp: Temp: 98.2 F (36.8 C)   98 F (36.7 C)  TempSrc: Oral   Oral  SpO2: 99% 98% 100% 100%  Weight:      Height:        Intake/Output Summary (Last 24 hours) at 01/09/2020 1806 Last data filed at 01/09/2020 1741 Gross per 24 hour  Intake 720 ml  Output 700 ml  Net 20 ml   Weight change:  Exam:   General:  Pt is alert, follows commands appropriately, not in acute distress  HEENT: No icterus, No thrush, No neck mass, [place]/AT  Cardiovascular: RRR, S1/S2, no rubs, no gallops  Respiratory: bibasilar crackles. No  wheeze  Abdomen: Soft/+BS, non tender, non distended, no guarding  Extremities: No edema, No lymphangitis, No petechiae, No rashes, no synovitis   Data Reviewed: I have personally reviewed following labs and imaging studies Basic Metabolic Panel: Recent Labs  Lab 01/05/20 1308 01/06/20 0603 01/07/20 0447 01/08/20 0444 01/09/20 0635  NA 121* 123* 125*  125* 128*  128* 129*  K 4.2 4.3 4.1  4.0 3.6  3.7 3.6  CL 92* 94* 97*  96* 100  101 99  CO2 17* 17* 17*  18* 18*  18* 20*  GLUCOSE 163* 120* 111*  111* 103*  106* 103*  BUN 66* 63* 53*  54* 50*  50* 42*  CREATININE 3.67* 3.57* 3.05*  3.02* 2.58*  2.56* 2.23*  CALCIUM 8.9 9.3 8.8*  8.7* 8.5*  8.5* 8.6*  PHOS  --   --  3.9 3.7 3.2   Liver Function Tests: Recent Labs  Lab 01/05/20 1308 01/07/20 0447 01/08/20 0444 01/09/20 0635  AST 38 30 28 31   ALT 24 19 17 18   ALKPHOS 89 63 53 54  BILITOT 2.9* 2.8* 2.9* 3.3*  PROT 6.4* 5.7* 5.3* 5.4*  ALBUMIN 3.5 3.6  3.6 3.4*  3.4* 3.4*  3.4*   No results for input(s): LIPASE, AMYLASE in the last 168 hours. Recent Labs  Lab 01/05/20 1308 01/09/20 0635  AMMONIA 140* 79*   Coagulation Profile: Recent Labs  Lab 01/05/20 1309 01/07/20 0447 01/08/20 0444 01/09/20 0635  INR 1.3* 1.6* 1.7* 1.7*   CBC: Recent Labs  Lab 01/05/20 1308 01/06/20 1628 01/07/20 0447 01/08/20 0444 01/09/20 0635  WBC 7.0 5.4 4.8 4.6 5.6  NEUTROABS 4.9  --   --   --   --   HGB 9.5* 7.5* 7.7* 7.2* 7.5*  HCT 26.9* 21.8* 22.0* 21.4* 22.0*  MCV 98.9 100.5* 99.5 100.0 101.4*  PLT 180 130* 120* 100* 107*   Cardiac Enzymes: No results for input(s): CKTOTAL, CKMB, CKMBINDEX, TROPONINI in the last 168 hours. BNP: Invalid input(s): POCBNP CBG: No results for input(s): GLUCAP in the last 168 hours. HbA1C: No results for input(s): HGBA1C in the last 72 hours. Urine analysis:    Component Value Date/Time   COLORURINE YELLOW 01/05/2020 2246   APPEARANCEUR CLEAR 01/05/2020 2246    LABSPEC 1.017 01/05/2020 2246   PHURINE 5.0 01/05/2020 2246   GLUCOSEU NEGATIVE 01/05/2020 2246   HGBUR MODERATE (A) 01/05/2020 2246   BILIRUBINUR NEGATIVE 01/05/2020 2246   KETONESUR NEGATIVE 01/05/2020 2246   PROTEINUR NEGATIVE 01/05/2020 2246   NITRITE NEGATIVE 01/05/2020 2246   LEUKOCYTESUR NEGATIVE 01/05/2020 2246   Sepsis Labs: @LABRCNTIP (procalcitonin:4,lacticidven:4) ) Recent Results (from the past 240 hour(s))  Respiratory Panel by RT PCR (Flu A&B, Covid) - Nasopharyngeal Swab     Status: None   Collection Time: 01/05/20 12:31 PM   Specimen: Nasopharyngeal Swab  Result Value Ref Range Status   SARS Coronavirus 2 by RT PCR NEGATIVE NEGATIVE Final    Comment: (NOTE) SARS-CoV-2 target nucleic acids are NOT DETECTED.  The SARS-CoV-2 RNA is generally detectable in upper respiratoy specimens during the acute phase of infection. The lowest concentration of SARS-CoV-2 viral copies this assay can detect is 131 copies/mL. A negative result does not preclude SARS-Cov-2 infection and should not be used as the sole basis for treatment or other patient management decisions. A negative result may occur with  improper specimen collection/handling, submission of specimen other than nasopharyngeal swab, presence of viral mutation(s) within the areas targeted by this assay, and inadequate number of viral copies (<131 copies/mL). A negative result must be  combined with clinical observations, patient history, and epidemiological information. The expected result is Negative.  Fact Sheet for Patients:  https://www.moore.com/https://www.fda.gov/media/142436/download  Fact Sheet for Healthcare Providers:  https://www.young.biz/https://www.fda.gov/media/142435/download  This test is no t yet approved or cleared by the [place] FDA and  has been authorized for detection and/or diagnosis of SARS-CoV-2 by FDA under an Emergency Use Authorization (EUA). This EUA will remain  in effect (meaning this test can be used) for the  duration of the COVID-19 declaration under Section 564(b)(1) of the Act, 21 U.S.C. section 360bbb-3(b)(1), unless the authorization is terminated or revoked sooner.     Influenza A by PCR NEGATIVE NEGATIVE Final   Influenza B by PCR NEGATIVE NEGATIVE Final    Comment: (NOTE) The Xpert Xpress SARS-CoV-2/FLU/RSV assay is intended as an aid in  the diagnosis of influenza from Nasopharyngeal swab specimens and  should not be used as a sole basis for treatment. Nasal washings and  aspirates are unacceptable for Xpert Xpress SARS-CoV-2/FLU/RSV  testing.  Fact Sheet for Patients: https://www.moore.com/https://www.fda.gov/media/142436/download  Fact Sheet for Healthcare Providers: https://www.young.biz/https://www.fda.gov/media/142435/download  This test is not yet approved or cleared by the [place] FDA and  has been authorized for detection and/or diagnosis of SARS-CoV-2 by  FDA under an Emergency Use Authorization (EUA). This EUA will remain  in effect (meaning this test can be used) for the duration of the  Covid-19 declaration under Section 564(b)(1) of the Act, 21  U.S.C. section 360bbb-3(b)(1), unless the authorization is  terminated or revoked. Performed at [facility], [address] [place], [place] [address]      Scheduled Meds: . darbepoetin (ARANESP) injection - NON-DIALYSIS  60 mcg Subcutaneous Q Wed-1800  . lactulose  20 g Oral BID  . midodrine  10 mg Oral TID WC  . pantoprazole  40 mg Oral BID  . rifaximin  550 mg Oral BID  . sodium bicarbonate  650 mg Oral BID   Continuous Infusions:  Procedures/Studies: US RENAL  Result Date: 01/05/2020 CLINICAL DATA:  54 year old male with acute on chronic renal failure. EXAM: RENAL / URINARY TRACT ULTRASOUND COMPLETE COMPARISON:  Renal ultrasound 11/14/2019. CT Abdomen and Pelvis 10/16/2019. FINDINGS: Right Kidney: Renal measurements: 10.8 x 3.4 x 4.9 cm (unchanged) = volume: 94 mL. Renal cortical echogenicity is stable since September, at the upper limits of  normal to mildly increased. No right hydronephrosis or renal lesion. Left Kidney: Less well visualized today. Estimated renal measurements: 10.2 x 3.5 x 3.4 cm = volume: 63 mL. Left renal echogenicity stable and at the upper limits of normal (image 30). No left hydronephrosis or renal lesion. Bladder: Diminutive, unremarkable. Other: Ascites throughout the abdomen. Nodular, cirrhotic liver (image 14). IMPRESSION: 1. No acute renal finding. Borderline to mildly echogenic kidneys compatible suspicious for medical renal disease. 2. Ascites.  Cirrhosis. Electronically Signed   By: Odessa FlemingH  Hall M.D.   On: 01/05/2020 16:51   US Paracentesis  Result Date: 01/09/2020 INDICATION: Alcoholic cirrhosis, ascites EXAM: ULTRASOUND GUIDED THERAPEUTIC PARACENTESIS MEDICATIONS: None COMPLICATIONS: None immediate PROCEDURE: Informed written consent was obtained from the patient after a discussion of the risks, benefits and alternatives to treatment. A timeout was performed prior to the initiation of the procedure. Initial ultrasound scanning demonstrates a large amount of ascites within the right lower abdominal quadrant. The right lower abdomen was prepped and draped in the usual sterile fashion. 1% lidocaine was used for local anesthesia. Following this, a 5 [place] Yueh catheter was introduced. An ultrasound image was saved for documentation  purposes. The paracentesis was performed. The catheter was removed and a dressing was applied. The patient tolerated the procedure well without immediate post procedural complication. FINDINGS: A total of approximately 3.0 L of cloudy yellow ascitic fluid was removed. IMPRESSION: Successful ultrasound-guided paracentesis yielding 3.0 L liters of peritoneal fluid. Electronically Signed   By: Ulyses Southward M.D.   On: 01/09/2020 11:58   US Paracentesis  Result Date: 01/07/2020 INDICATION: Alcoholic cirrhosis, ascites EXAM: ULTRASOUND GUIDED THERAPEUTIC PARACENTESIS MEDICATIONS: None COMPLICATIONS:  None immediate PROCEDURE: Informed written consent was obtained from the patient after a discussion of the risks, benefits and alternatives to treatment. A timeout was performed prior to the initiation of the procedure. Initial ultrasound scanning demonstrates a large amount of ascites within the right lower abdominal quadrant. The right lower abdomen was prepped and draped in the usual sterile fashion. 1% lidocaine was used for local anesthesia. Following this, a 5 [place] Yueh catheter was introduced. An ultrasound image was saved for documentation purposes. The paracentesis was performed. The catheter was removed and a dressing was applied. The patient tolerated the procedure well without immediate post procedural complication. FINDINGS: A total of approximately 3 L of cloudy light yellow fluid was removed. IMPRESSION: Successful ultrasound-guided paracentesis yielding 3 L liters of peritoneal fluid. Electronically Signed   By: Ulyses Southward M.D.   On: 01/07/2020 16:16   US Paracentesis  Result Date: 12/26/2019 INDICATION: Alcoholic cirrhosis, ascites EXAM: ULTRASOUND GUIDED DIAGNOSTIC AND THERAPEUTIC PARACENTESIS MEDICATIONS: None COMPLICATIONS: None immediate PROCEDURE: Informed written consent was obtained from the patient after a discussion of the risks, benefits and alternatives to treatment. A timeout was performed prior to the initiation of the procedure. Initial ultrasound scanning demonstrates a large amount of ascites within the right lower abdominal quadrant. The right lower abdomen was prepped and draped in the usual sterile fashion. 1% lidocaine was used for local anesthesia. Following this, a 5 [place] Yueh catheter was introduced. An ultrasound image was saved for documentation purposes. The paracentesis was performed. The catheter was removed and a dressing was applied. The patient tolerated the procedure well without immediate post procedural complication. Patient received post-procedure  intravenous albumin; see nursing notes for details. FINDINGS: A total of approximately 8 L of cloudy yellow fluid was removed. Samples were sent to the laboratory for requested analysis. IMPRESSION: Successful ultrasound-guided paracentesis yielding 8 liters of peritoneal fluid. Electronically Signed   By: Ulyses Southward M.D.   On: 12/26/2019 13:21   US Paracentesis  Result Date: 12/19/2019 INDICATION: Cirrhosis EXAM: ULTRASOUND GUIDED RIGHT ABDOMINAL PARACENTESIS MEDICATIONS: None COMPLICATIONS: None immediate. PROCEDURE: Informed written consent was obtained from the patient after a discussion of the risks, benefits and alternatives to treatment. A timeout was performed prior to the initiation of the procedure. Initial ultrasound scanning demonstrates a large amount of ascites within the right lower abdominal quadrant. The right lower abdomen was prepped and draped in the usual sterile fashion. 1% lidocaine was used for local anesthesia. Following this, a 19 gauge, 7-cm, Yueh catheter was introduced. An ultrasound image was saved for documentation purposes. The paracentesis was performed. The catheter was removed and a dressing was applied. The patient tolerated the procedure well without immediate post procedural complication. Patient received post-procedure intravenous albumin; see nursing notes for details. FINDINGS: A total of approximately 8 L of cloudy, yellow fluid was removed. Samples were sent to the laboratory as requested by the clinical team. IMPRESSION: Successful ultrasound-guided paracentesis yielding 8 liters of peritoneal fluid. Electronically Signed   By:  Jeronimo Greaves M.D.   On: 12/19/2019 15:50   US Paracentesis  Result Date: 12/12/2019 INDICATION: Alcoholic cirrhosis, recurrent ascites EXAM: ULTRASOUND GUIDED DIAGNOSTIC AND THERAPEUTIC PARACENTESIS MEDICATIONS: None COMPLICATIONS: None immediate PROCEDURE: Informed written consent was obtained from the patient after a discussion of the  risks, benefits and alternatives to treatment. A timeout was performed prior to the initiation of the procedure. Initial ultrasound scanning demonstrates a large amount of ascites within the LEFT lower abdominal quadrant. The right lower abdomen was prepped and draped in the usual sterile fashion. 1% lidocaine was used for local anesthesia. Following this, a 5 French catheter was introduced. An ultrasound image was saved for documentation purposes. The paracentesis was performed. The catheter was removed and a dressing was applied. The patient tolerated the procedure well without immediate post procedural complication. Patient received post-procedure intravenous albumin; see nursing notes for details. FINDINGS: A total of approximately 8 L of cloudy yellow ascitic fluid was removed. Samples were sent to the laboratory as requested by the clinical team. IMPRESSION: Successful ultrasound-guided paracentesis yielding 8 liters of peritoneal fluid. Electronically Signed   By: Ulyses Southward M.D.   On: 12/12/2019 15:15    Catarina Hartshorn, DO  [facility]  If 7PM-7AM, please contact night-coverage [email or web address] Password [email or web address] 01/09/2020, 6:06 PM   LOS: 4 days

## 2020-01-10 LAB — CBC
HCT: 23.3 % — ABNORMAL LOW (ref 39.0–52.0)
Hemoglobin: 8.1 g/dL — ABNORMAL LOW (ref 13.0–17.0)
MCH: 34.6 pg — ABNORMAL HIGH (ref 26.0–34.0)
MCHC: 34.8 g/dL (ref 30.0–36.0)
MCV: 99.6 fL (ref 80.0–100.0)
Platelets: 118 10*3/uL — ABNORMAL LOW (ref 150–400)
RBC: 2.34 MIL/uL — ABNORMAL LOW (ref 4.22–5.81)
RDW: 13.1 % (ref 11.5–15.5)
WBC: 6.8 10*3/uL (ref 4.0–10.5)
nRBC: 0 % (ref 0.0–0.2)

## 2020-01-10 LAB — RENAL FUNCTION PANEL
Albumin: 3.5 g/dL (ref 3.5–5.0)
Anion gap: 9 (ref 5–15)
BUN: 35 mg/dL — ABNORMAL HIGH (ref 6–20)
CO2: 20 mmol/L — ABNORMAL LOW (ref 22–32)
Calcium: 8.7 mg/dL — ABNORMAL LOW (ref 8.9–10.3)
Chloride: 99 mmol/L (ref 98–111)
Creatinine, Ser: 1.93 mg/dL — ABNORMAL HIGH (ref 0.61–1.24)
GFR, Estimated: 40 mL/min — ABNORMAL LOW (ref >60)
Glucose, Bld: 125 mg/dL — ABNORMAL HIGH (ref 70–99)
Phosphorus: 2.7 mg/dL (ref 2.5–4.6)
Potassium: 3.5 mmol/L (ref 3.5–5.1)
Sodium: 128 mmol/L — ABNORMAL LOW (ref 135–145)

## 2020-01-10 LAB — COMPREHENSIVE METABOLIC PANEL
ALT: 19 U/L (ref 0–44)
AST: 29 U/L (ref 15–41)
Albumin: 3.6 g/dL (ref 3.5–5.0)
Alkaline Phosphatase: 58 U/L (ref 38–126)
Anion gap: 9 (ref 5–15)
BUN: 36 mg/dL — ABNORMAL HIGH (ref 6–20)
CO2: 20 mmol/L — ABNORMAL LOW (ref 22–32)
Calcium: 8.6 mg/dL — ABNORMAL LOW (ref 8.9–10.3)
Chloride: 99 mmol/L (ref 98–111)
Creatinine, Ser: 1.91 mg/dL — ABNORMAL HIGH (ref 0.61–1.24)
GFR, Estimated: 41 mL/min — ABNORMAL LOW (ref >60)
Glucose, Bld: 123 mg/dL — ABNORMAL HIGH (ref 70–99)
Potassium: 3.5 mmol/L (ref 3.5–5.1)
Sodium: 128 mmol/L — ABNORMAL LOW (ref 135–145)
Total Bilirubin: 2.9 mg/dL — ABNORMAL HIGH (ref 0.3–1.2)
Total Protein: 5.7 g/dL — ABNORMAL LOW (ref 6.5–8.1)

## 2020-01-10 LAB — PROTIME-INR
INR: 1.6 — ABNORMAL HIGH (ref 0.8–1.2)
Prothrombin Time: 18.1 seconds — ABNORMAL HIGH (ref 11.4–15.2)

## 2020-01-10 LAB — AMMONIA: Ammonia: 85 umol/L — ABNORMAL HIGH (ref 9–35)

## 2020-01-10 MED ORDER — MIDODRINE HCL 10 MG PO TABS
10.0000 mg | ORAL_TABLET | Freq: Three times a day (TID) | ORAL | 1 refills | Status: DC
Start: 2020-01-10 — End: 2020-01-19

## 2020-01-10 MED ORDER — SODIUM BICARBONATE 650 MG PO TABS
650.0000 mg | ORAL_TABLET | Freq: Two times a day (BID) | ORAL | 1 refills | Status: DC
Start: 2020-01-10 — End: 2020-01-19

## 2020-01-10 NOTE — Progress Notes (Signed)
IV removed and DC instructions reviewed.  Scripts sent to pharmacy .  Wife to drive home

## 2020-01-10 NOTE — Discharge Summary (Signed)
Physician Discharge Summary  Sabino DonovanJoseph Fuhs YQM:[id number]:[id number] DOB: 1965/09/03 DOA: 01/05/2020  PCP: Patient, No Pcp Per  Admit date: 01/05/2020 Discharge date: 01/10/2020  Admitted From: Home Disposition:  Home  Recommendations for Outpatient Follow-up:  1. Follow up with PCP in 1-2 weeks 2. Please obtain BMP/CBC in one week     Discharge Condition: Stable CODE STATUS: Home Diet recommendation: Low sodium   Brief/Interim Summary: 54 y.o.malewith medical history significant ofend-stage cirrhosis due to alcohol, grade 1 esophageal varices, depression,chronic kidney disease a stage IIIbandhyponatremia;who presented to the emergency department as request of his nephrologist for worsening renal function. Patient reports requiring increase in the amount of paracentesis secondary to worsening his ascites and initially increased use of diuretics to try to help control involvement; since Tuesday (12/29/19)diuretics were discontinued in the setting of worsening creatinine and electrolytesafter seeing his nephrologist. Patient reports that on evaluation and nephrology's office his repeat blood work were worse and he was advised to come to the hospital for further evaluation and management. Patient reports no chest pain, no nausea, no vomiting, no hematuria, no dysuria, no abdominal pain, no hematemesis, some intermittent scant bright red blood in his stools after having multiple bowel movements due to the use of lactulose. No fever, no shortness of breath, no focal weakness. Appetite is decreased and he had general malaise. Renal was consulted and patient was started on midodrine, octreotide, and albumin. His diuretics were held.  He renal function gradually improved.   Discharge Diagnoses:  acute on chronic renal failure--CKD 3b -secondary to hepatorenal syndrome and intravascular depletion -baseline creatinine 1.6-1.9 -last 2 paracenteses on 10/15 and 10/22 removed 8L each  time -treatedwith midodrine, octreotide and albuminduring hospitalization -stopped octreotide am 11/4 -Follow nephrology service recommendations. Continue to avoid nephrotoxic agents, hypotension and large volume paracentesis -case discussed with renal, Dr. Carilyn GoodpasturePeeples--cleared pt of d/c on 11/6 -will not restart lasix or spironolactone at time of d/c -follow renal office within on week -serum creatinine 1.93 on day of d/c  Alcoholliver cirrhosis/ascites -Continue low-sodium diet -Continue Xifaxan and lactulose -Continue PPI -No signs for SBP currently -appreciate GI -11/3paracentesis--Limited to 3L fluid removal -MELD = 32 -11/5 paracetnesis--3L removed  Hyponatremia--hypervolemic -Chronic inthe setting of renal failure on chronic cirrhosis -Continue to follow electrolytes trend  metabolic acidosis -Patient has been started on sodium bicarb -Continue to follow -Follow nephrology service recommendation  gastroesophageal flux disease -Continue PPI  moderate protein calorie malnutrition -In the setting oflivercirrhosis -Follow recommendation for feeding supplement by dietitian.  Hyperammonemia -elevated, but mental status at baseline -pt intermittently refusing lactulose-->educated not to refuse  Discharge Instructions   Allergies as of 01/10/2020      Reactions   Codeine Hives, Itching, Swelling   Other reaction(s): Dizziness      Medication List    STOP taking these medications   potassium chloride SA 20 MEQ tablet Commonly known as: KLOR-CON     TAKE these medications   baclofen 10 MG tablet Commonly known as: LIORESAL TAKE 1 TABLET BY MOUTH THREE TIMES DAILY What changed:   how much to take  how to take this  when to take this  reasons to take this   Constulose 10 GM/15ML solution Generic drug: lactulose take 30 mls BY MOUTH in THE morning AND AT BEDTIME What changed: See the new instructions.   midodrine 10 MG tablet Commonly known  as: PROAMATINE Take 1 tablet (10 mg total) by mouth 3 (three) times daily with meals.   ondansetron 4 MG tablet Commonly  known as: ZOFRAN Take 1 tablet (4 mg total) by mouth every 8 (eight) hours as needed for nausea or vomiting.   pantoprazole 40 MG tablet Commonly known as: Protonix Take 1 tablet (40 mg total) by mouth 2 (two) times daily before a meal.   rifaximin 550 MG Tabs tablet Commonly known as: XIFAXAN Take 1 tablet (550 mg total) by mouth 2 (two) times daily.   sodium bicarbonate 650 MG tablet Take 1 tablet (650 mg total) by mouth 2 (two) times daily.   Zinc 50 MG Tabs Take 1 tablet by mouth daily.       Allergies  Allergen Reactions  . Codeine Hives, Itching and Swelling    Other reaction(s): Dizziness    Consultations:  renal   Procedures/Studies: US RENAL  Result Date: 01/05/2020 CLINICAL DATA:  54 year old male with acute on chronic renal failure. EXAM: RENAL / URINARY TRACT ULTRASOUND COMPLETE COMPARISON:  Renal ultrasound 11/14/2019. CT Abdomen and Pelvis 10/16/2019. FINDINGS: Right Kidney: Renal measurements: 10.8 x 3.4 x 4.9 cm (unchanged) = volume: 94 mL. Renal cortical echogenicity is stable since September, at the upper limits of normal to mildly increased. No right hydronephrosis or renal lesion. Left Kidney: Less well visualized today. Estimated renal measurements: 10.2 x 3.5 x 3.4 cm = volume: 63 mL. Left renal echogenicity stable and at the upper limits of normal (image 30). No left hydronephrosis or renal lesion. Bladder: Diminutive, unremarkable. Other: Ascites throughout the abdomen. Nodular, cirrhotic liver (image 14). IMPRESSION: 1. No acute renal finding. Borderline to mildly echogenic kidneys compatible suspicious for medical renal disease. 2. Ascites.  Cirrhosis. Electronically Signed   By: Odessa Fleming M.D.   On: 01/05/2020 16:51   US Paracentesis  Result Date: 01/09/2020 INDICATION: Alcoholic cirrhosis, ascites EXAM: ULTRASOUND GUIDED  THERAPEUTIC PARACENTESIS MEDICATIONS: None COMPLICATIONS: None immediate PROCEDURE: Informed written consent was obtained from the patient after a discussion of the risks, benefits and alternatives to treatment. A timeout was performed prior to the initiation of the procedure. Initial ultrasound scanning demonstrates a large amount of ascites within the right lower abdominal quadrant. The right lower abdomen was prepped and draped in the usual sterile fashion. 1% lidocaine was used for local anesthesia. Following this, a 5 [place] Yueh catheter was introduced. An ultrasound image was saved for documentation purposes. The paracentesis was performed. The catheter was removed and a dressing was applied. The patient tolerated the procedure well without immediate post procedural complication. FINDINGS: A total of approximately 3.0 L of cloudy yellow ascitic fluid was removed. IMPRESSION: Successful ultrasound-guided paracentesis yielding 3.0 L liters of peritoneal fluid. Electronically Signed   By: Ulyses Southward M.D.   On: 01/09/2020 11:58   US Paracentesis  Result Date: 01/07/2020 INDICATION: Alcoholic cirrhosis, ascites EXAM: ULTRASOUND GUIDED THERAPEUTIC PARACENTESIS MEDICATIONS: None COMPLICATIONS: None immediate PROCEDURE: Informed written consent was obtained from the patient after a discussion of the risks, benefits and alternatives to treatment. A timeout was performed prior to the initiation of the procedure. Initial ultrasound scanning demonstrates a large amount of ascites within the right lower abdominal quadrant. The right lower abdomen was prepped and draped in the usual sterile fashion. 1% lidocaine was used for local anesthesia. Following this, a 5 [place] Yueh catheter was introduced. An ultrasound image was saved for documentation purposes. The paracentesis was performed. The catheter was removed and a dressing was applied. The patient tolerated the procedure well without immediate post procedural  complication. FINDINGS: A total of approximately 3 L of cloudy light yellow  fluid was removed. IMPRESSION: Successful ultrasound-guided paracentesis yielding 3 L liters of peritoneal fluid. Electronically Signed   By: Ulyses Southward M.D.   On: 01/07/2020 16:16   US Paracentesis  Result Date: 12/26/2019 INDICATION: Alcoholic cirrhosis, ascites EXAM: ULTRASOUND GUIDED DIAGNOSTIC AND THERAPEUTIC PARACENTESIS MEDICATIONS: None COMPLICATIONS: None immediate PROCEDURE: Informed written consent was obtained from the patient after a discussion of the risks, benefits and alternatives to treatment. A timeout was performed prior to the initiation of the procedure. Initial ultrasound scanning demonstrates a large amount of ascites within the right lower abdominal quadrant. The right lower abdomen was prepped and draped in the usual sterile fashion. 1% lidocaine was used for local anesthesia. Following this, a 5 [place] Yueh catheter was introduced. An ultrasound image was saved for documentation purposes. The paracentesis was performed. The catheter was removed and a dressing was applied. The patient tolerated the procedure well without immediate post procedural complication. Patient received post-procedure intravenous albumin; see nursing notes for details. FINDINGS: A total of approximately 8 L of cloudy yellow fluid was removed. Samples were sent to the laboratory for requested analysis. IMPRESSION: Successful ultrasound-guided paracentesis yielding 8 liters of peritoneal fluid. Electronically Signed   By: Ulyses Southward M.D.   On: 12/26/2019 13:21   US Paracentesis  Result Date: 12/19/2019 INDICATION: Cirrhosis EXAM: ULTRASOUND GUIDED RIGHT ABDOMINAL PARACENTESIS MEDICATIONS: None COMPLICATIONS: None immediate. PROCEDURE: Informed written consent was obtained from the patient after a discussion of the risks, benefits and alternatives to treatment. A timeout was performed prior to the initiation of the procedure. Initial  ultrasound scanning demonstrates a large amount of ascites within the right lower abdominal quadrant. The right lower abdomen was prepped and draped in the usual sterile fashion. 1% lidocaine was used for local anesthesia. Following this, a 19 gauge, 7-cm, Yueh catheter was introduced. An ultrasound image was saved for documentation purposes. The paracentesis was performed. The catheter was removed and a dressing was applied. The patient tolerated the procedure well without immediate post procedural complication. Patient received post-procedure intravenous albumin; see nursing notes for details. FINDINGS: A total of approximately 8 L of cloudy, yellow fluid was removed. Samples were sent to the laboratory as requested by the clinical team. IMPRESSION: Successful ultrasound-guided paracentesis yielding 8 liters of peritoneal fluid. Electronically Signed   By: Jeronimo Greaves M.D.   On: 12/19/2019 15:50   US Paracentesis  Result Date: 12/12/2019 INDICATION: Alcoholic cirrhosis, recurrent ascites EXAM: ULTRASOUND GUIDED DIAGNOSTIC AND THERAPEUTIC PARACENTESIS MEDICATIONS: None COMPLICATIONS: None immediate PROCEDURE: Informed written consent was obtained from the patient after a discussion of the risks, benefits and alternatives to treatment. A timeout was performed prior to the initiation of the procedure. Initial ultrasound scanning demonstrates a large amount of ascites within the LEFT lower abdominal quadrant. The right lower abdomen was prepped and draped in the usual sterile fashion. 1% lidocaine was used for local anesthesia. Following this, a 5 French catheter was introduced. An ultrasound image was saved for documentation purposes. The paracentesis was performed. The catheter was removed and a dressing was applied. The patient tolerated the procedure well without immediate post procedural complication. Patient received post-procedure intravenous albumin; see nursing notes for details. FINDINGS: A total of  approximately 8 L of cloudy yellow ascitic fluid was removed. Samples were sent to the laboratory as requested by the clinical team. IMPRESSION: Successful ultrasound-guided paracentesis yielding 8 liters of peritoneal fluid. Electronically Signed   By: Ulyses Southward M.D.   On: 12/12/2019 15:15  Discharge Exam: Vitals:   01/09/20 2100 01/10/20 0500  BP: 90/61 (!) 92/53  Pulse: 73 70  Resp: 20 18  Temp:  98.4 F (36.9 C)  SpO2: 100% 98%   Vitals:   01/09/20 1314 01/09/20 2005 01/09/20 2100 01/10/20 0500  BP: 93/63  90/61 (!) 92/53  Pulse: 63  73 70  Resp: 20  20 18   Temp: 98 F (36.7 C)   98.4 F (36.9 C)  TempSrc: Oral     SpO2: 100% 99% 100% 98%  Weight:      Height:        General: Pt is alert, awake, not in acute distress Cardiovascular: RRR, S1/S2 +, no rubs, no gallops Respiratory: bibasilar rales. No wheeze Abdominal: Soft, NT, ND, bowel sounds + Extremities: no edema, no cyanosis   The results of significant diagnostics from this hospitalization (including imaging, microbiology, ancillary and laboratory) are listed below for reference.    Significant Diagnostic Studies: RENAL  Result Date: 01/05/2020 CLINICAL DATA:  54 year old male with acute on chronic renal failure. EXAM: RENAL / URINARY TRACT ULTRASOUND COMPLETE COMPARISON:  Renal ultrasound 11/14/2019. CT Abdomen and Pelvis 10/16/2019. FINDINGS: Right Kidney: Renal measurements: 10.8 x 3.4 x 4.9 cm (unchanged) = volume: 94 mL. Renal cortical echogenicity is stable since September, at the upper limits of normal to mildly increased. No right hydronephrosis or renal lesion. Left Kidney: Less well visualized today. Estimated renal measurements: 10.2 x 3.5 x 3.4 cm = volume: 63 mL. Left renal echogenicity stable and at the upper limits of normal (image 30). No left hydronephrosis or renal lesion. Bladder: Diminutive, unremarkable. Other: Ascites throughout the abdomen. Nodular, cirrhotic liver (image 14).  IMPRESSION: 1. No acute renal finding. Borderline to mildly echogenic kidneys compatible suspicious for medical renal disease. 2. Ascites.  Cirrhosis. Electronically Signed   By: [place] M.D.   On: 01/05/2020 16:51   13/03/2019 Paracentesis  Result Date: 01/09/2020 INDICATION: Alcoholic cirrhosis, ascites EXAM: ULTRASOUND GUIDED THERAPEUTIC PARACENTESIS MEDICATIONS: None COMPLICATIONS: None immediate PROCEDURE: Informed written consent was obtained from the patient after a discussion of the risks, benefits and alternatives to treatment. A timeout was performed prior to the initiation of the procedure. Initial ultrasound scanning demonstrates a large amount of ascites within the right lower abdominal quadrant. The right lower abdomen was prepped and draped in the usual sterile fashion. 1% lidocaine was used for local anesthesia. Following this, a 5 13/07/2019 Yueh catheter was introduced. An ultrasound image was saved for documentation purposes. The paracentesis was performed. The catheter was removed and a dressing was applied. The patient tolerated the procedure well without immediate post procedural complication. FINDINGS: A total of approximately 3.0 L of cloudy yellow ascitic fluid was removed. IMPRESSION: Successful ultrasound-guided paracentesis yielding 3.0 L liters of peritoneal fluid. Electronically Signed   By: [place] M.D.   On: 01/09/2020 11:58   13/07/2019 Paracentesis  Result Date: 01/07/2020 INDICATION: Alcoholic cirrhosis, ascites EXAM: ULTRASOUND GUIDED THERAPEUTIC PARACENTESIS MEDICATIONS: None COMPLICATIONS: None immediate PROCEDURE: Informed written consent was obtained from the patient after a discussion of the risks, benefits and alternatives to treatment. A timeout was performed prior to the initiation of the procedure. Initial ultrasound scanning demonstrates a large amount of ascites within the right lower abdominal quadrant. The right lower abdomen was prepped and draped in the usual sterile  fashion. 1% lidocaine was used for local anesthesia. Following this, a 5 13/05/2019 Yueh catheter was introduced. An ultrasound image was saved for documentation purposes. The paracentesis  was performed. The catheter was removed and a dressing was applied. The patient tolerated the procedure well without immediate post procedural complication. FINDINGS: A total of approximately 3 L of cloudy light yellow fluid was removed. IMPRESSION: Successful ultrasound-guided paracentesis yielding 3 L liters of peritoneal fluid. Electronically Signed   By: Ulyses Southward M.D.   On: 01/07/2020 16:16   US Paracentesis  Result Date: 12/26/2019 INDICATION: Alcoholic cirrhosis, ascites EXAM: ULTRASOUND GUIDED DIAGNOSTIC AND THERAPEUTIC PARACENTESIS MEDICATIONS: None COMPLICATIONS: None immediate PROCEDURE: Informed written consent was obtained from the patient after a discussion of the risks, benefits and alternatives to treatment. A timeout was performed prior to the initiation of the procedure. Initial ultrasound scanning demonstrates a large amount of ascites within the right lower abdominal quadrant. The right lower abdomen was prepped and draped in the usual sterile fashion. 1% lidocaine was used for local anesthesia. Following this, a 5 [place] Yueh catheter was introduced. An ultrasound image was saved for documentation purposes. The paracentesis was performed. The catheter was removed and a dressing was applied. The patient tolerated the procedure well without immediate post procedural complication. Patient received post-procedure intravenous albumin; see nursing notes for details. FINDINGS: A total of approximately 8 L of cloudy yellow fluid was removed. Samples were sent to the laboratory for requested analysis. IMPRESSION: Successful ultrasound-guided paracentesis yielding 8 liters of peritoneal fluid. Electronically Signed   By: Ulyses Southward M.D.   On: 12/26/2019 13:21   US Paracentesis  Result Date:  12/19/2019 INDICATION: Cirrhosis EXAM: ULTRASOUND GUIDED RIGHT ABDOMINAL PARACENTESIS MEDICATIONS: None COMPLICATIONS: None immediate. PROCEDURE: Informed written consent was obtained from the patient after a discussion of the risks, benefits and alternatives to treatment. A timeout was performed prior to the initiation of the procedure. Initial ultrasound scanning demonstrates a large amount of ascites within the right lower abdominal quadrant. The right lower abdomen was prepped and draped in the usual sterile fashion. 1% lidocaine was used for local anesthesia. Following this, a 19 gauge, 7-cm, Yueh catheter was introduced. An ultrasound image was saved for documentation purposes. The paracentesis was performed. The catheter was removed and a dressing was applied. The patient tolerated the procedure well without immediate post procedural complication. Patient received post-procedure intravenous albumin; see nursing notes for details. FINDINGS: A total of approximately 8 L of cloudy, yellow fluid was removed. Samples were sent to the laboratory as requested by the clinical team. IMPRESSION: Successful ultrasound-guided paracentesis yielding 8 liters of peritoneal fluid. Electronically Signed   By: Jeronimo Greaves M.D.   On: 12/19/2019 15:50   US Paracentesis  Result Date: 12/12/2019 INDICATION: Alcoholic cirrhosis, recurrent ascites EXAM: ULTRASOUND GUIDED DIAGNOSTIC AND THERAPEUTIC PARACENTESIS MEDICATIONS: None COMPLICATIONS: None immediate PROCEDURE: Informed written consent was obtained from the patient after a discussion of the risks, benefits and alternatives to treatment. A timeout was performed prior to the initiation of the procedure. Initial ultrasound scanning demonstrates a large amount of ascites within the LEFT lower abdominal quadrant. The right lower abdomen was prepped and draped in the usual sterile fashion. 1% lidocaine was used for local anesthesia. Following this, a 5 French catheter was  introduced. An ultrasound image was saved for documentation purposes. The paracentesis was performed. The catheter was removed and a dressing was applied. The patient tolerated the procedure well without immediate post procedural complication. Patient received post-procedure intravenous albumin; see nursing notes for details. FINDINGS: A total of approximately 8 L of cloudy yellow ascitic fluid was removed. Samples were sent to the laboratory as  requested by the clinical team. IMPRESSION: Successful ultrasound-guided paracentesis yielding 8 liters of peritoneal fluid. Electronically Signed   By: Ulyses Southward M.D.   On: 12/12/2019 15:15     Microbiology: Recent Results (from the past 240 hour(s))  Respiratory Panel by RT PCR (Flu A&B, Covid) - Nasopharyngeal Swab     Status: None   Collection Time: 01/05/20 12:31 PM   Specimen: Nasopharyngeal Swab  Result Value Ref Range Status   SARS Coronavirus 2 by RT PCR NEGATIVE NEGATIVE Final    Comment: (NOTE) SARS-CoV-2 target nucleic acids are NOT DETECTED.  The SARS-CoV-2 RNA is generally detectable in upper respiratoy specimens during the acute phase of infection. The lowest concentration of SARS-CoV-2 viral copies this assay can detect is 131 copies/mL. A negative result does not preclude SARS-Cov-2 infection and should not be used as the sole basis for treatment or other patient management decisions. A negative result may occur with  improper specimen collection/handling, submission of specimen other than nasopharyngeal swab, presence of viral mutation(s) within the areas targeted by this assay, and inadequate number of viral copies (<131 copies/mL). A negative result must be combined with clinical observations, patient history, and epidemiological information. The expected result is Negative.  Fact Sheet for Patients:  https://www.moore.com/  Fact Sheet for Healthcare Providers:   https://www.young.biz/  This test is no t yet approved or cleared by the [place] FDA and  has been authorized for detection and/or diagnosis of SARS-CoV-2 by FDA under an Emergency Use Authorization (EUA). This EUA will remain  in effect (meaning this test can be used) for the duration of the COVID-19 declaration under Section 564(b)(1) of the Act, 21 U.S.C. section 360bbb-3(b)(1), unless the authorization is terminated or revoked sooner.     Influenza A by PCR NEGATIVE NEGATIVE Final   Influenza B by PCR NEGATIVE NEGATIVE Final    Comment: (NOTE) The Xpert Xpress SARS-CoV-2/FLU/RSV assay is intended as an aid in  the diagnosis of influenza from Nasopharyngeal swab specimens and  should not be used as a sole basis for treatment. Nasal washings and  aspirates are unacceptable for Xpert Xpress SARS-CoV-2/FLU/RSV  testing.  Fact Sheet for Patients: https://www.moore.com/  Fact Sheet for Healthcare Providers: https://www.young.biz/  This test is not yet approved or cleared by the [place] FDA and  has been authorized for detection and/or diagnosis of SARS-CoV-2 by  FDA under an Emergency Use Authorization (EUA). This EUA will remain  in effect (meaning this test can be used) for the duration of the  Covid-19 declaration under Section 564(b)(1) of the Act, 21  U.S.C. section 360bbb-3(b)(1), unless the authorization is  terminated or revoked. Performed at [facility], [address], [place], [place] [address]      Labs: Basic Metabolic Panel: Recent Labs  Lab 01/06/20 0603 01/06/20 0603 01/07/20 0447 01/07/20 0447 01/08/20 0444 01/08/20 0444 01/09/20 0635 01/10/20 0749  NA 123*  --  125*  125*  --  128*  128*  --  129* 128*  128*  K 4.3   < > 4.1  4.0   < > 3.6  3.7   < > 3.6 3.5  3.5  CL 94*  --  97*  96*  --  100  101  --  99 99  99  CO2 17*  --  17*  18*  --  18*  18*  --  20* 20*  20*   GLUCOSE 120*  --  111*  111*  --  103*  106*  --  103* 125*  123*  BUN 63*  --  53*  54*  --  50*  50*  --  42* 35*  36*  CREATININE 3.57*  --  3.05*  3.02*  --  2.58*  2.56*  --  2.23* 1.93*  1.91*  CALCIUM 9.3  --  8.8*  8.7*  --  8.5*  8.5*  --  8.6* 8.7*  8.6*  PHOS  --   --  3.9  --  3.7  --  3.2 2.7   < > = values in this interval not displayed.   Liver Function Tests: Recent Labs  Lab 01/05/20 1308 01/07/20 0447 01/08/20 0444 01/09/20 0635 01/10/20 0749  AST 38 30 28 31 29   ALT 24 19 17 18 19   ALKPHOS 89 63 53 54 58  BILITOT 2.9* 2.8* 2.9* 3.3* 2.9*  PROT 6.4* 5.7* 5.3* 5.4* 5.7*  ALBUMIN 3.5 3.6  3.6 3.4*  3.4* 3.4*  3.4* 3.5  3.6   No results for input(s): LIPASE, AMYLASE in the last 168 hours. Recent Labs  Lab 01/05/20 1308 01/09/20 0635 01/10/20 0749  AMMONIA 140* 79* 85*   CBC: Recent Labs  Lab 01/05/20 1308 01/05/20 1308 01/06/20 1628 01/07/20 0447 01/08/20 0444 01/09/20 0635 01/10/20 0749  WBC 7.0   < > 5.4 4.8 4.6 5.6 6.8  NEUTROABS 4.9  --   --   --   --   --   --   HGB 9.5*   < > 7.5* 7.7* 7.2* 7.5* 8.1*  HCT 26.9*   < > 21.8* 22.0* 21.4* 22.0* 23.3*  MCV 98.9   < > 100.5* 99.5 100.0 101.4* 99.6  PLT 180   < > 130* 120* 100* 107* 118*   < > = values in this interval not displayed.   Cardiac Enzymes: No results for input(s): CKTOTAL, CKMB, CKMBINDEX, TROPONINI in the last 168 hours. BNP: Invalid input(s): POCBNP CBG: No results for input(s): GLUCAP in the last 168 hours.  Time coordinating discharge:  36 minutes  Signed:  13/05/21, DO [facility] Pager: [phone] 01/10/2020, 11:51 AM

## 2020-01-10 NOTE — Progress Notes (Signed)
[facility] Progress Note   Assessment/ Plan:   Assessment/ Plan: Pt is a 54 y.o. yo male with cirrhosis and previous HRS medically managed who was admitted on 01/05/2020 with AKI   1. AKI/ HRS - AKI from recent baseline in the setting of possible volume depletion with diuretics vs looking like another flare of HRS.  Have held diuretics and received several days of HRS treatment w/ albumin, midodrine, and octreotide. Crt continues to improve after weaning therapy. Okay for DC at this time. Have him reach out to Dr. Marisue Phillips on Monday to arrange f/u and labs. Would hold diuretics at DC. 2. HTN/vol-  Ascites but no longer with peripheral edema-  Diuretics on hold.  S/p paracentesis 11/3 and 11/5. Has tolerated well. Hold diuretics 3. Hyponatremia-  Cirrhosis physiology and free water retention. Stable at this time. 4. Cirrhosis -  MELD score in the 30's-  Had been trying at [facility] to get on list for transplant.  GI on board, appreciate assistance.  Lack of insurance is complicating things. 5. Acidosis-  On bicarb  6. Anemia-  iron OK, getting ESA  Subjective:    Patient feels well with no complaints. Crt improving.   Objective:   BP (!) 92/53   Pulse 70   Temp 98.4 F (36.9 C)   Resp 18   Ht 5\' 9"  (1.753 m)   Wt 71.6 kg   SpO2 98%   BMI 23.31 kg/m   Physical Exam: Gen: NAD, lying inbed HEENT: + temporal wasting CVS: RRR Resp: clear  Abd: no pain with palpation, minimal distention Ext: no LE edema  Labs: BMET Recent Labs  Lab 01/05/20 1308 01/06/20 0603 01/07/20 0447 01/08/20 0444 01/09/20 0635 01/10/20 0749  NA 121* 123* 125*  125* 128*  128* 129* 128*  128*  K 4.2 4.3 4.1  4.0 3.6  3.7 3.6 3.5  3.5  CL 92* 94* 97*  96* 100  101 99 99  99  CO2 17* 17* 17*  18* 18*  18* 20* 20*  20*  GLUCOSE 163* 120* 111*  111* 103*  106* 103* 125*  123*  BUN 66* 63* 53*  54* 50*  50* 42* 35*  36*  CREATININE 3.67* 3.57* 3.05*  3.02* 2.58*  2.56*  2.23* 1.93*  1.91*  CALCIUM 8.9 9.3 8.8*  8.7* 8.5*  8.5* 8.6* 8.7*  8.6*  PHOS  --   --  3.9 3.7 3.2 2.7   CBC Recent Labs  Lab 01/05/20 1308 01/06/20 1628 01/07/20 0447 01/08/20 0444 01/09/20 0635 01/10/20 0749  WBC 7.0   < > 4.8 4.6 5.6 6.8  NEUTROABS 4.9  --   --   --   --   --   HGB 9.5*   < > 7.7* 7.2* 7.5* 8.1*  HCT 26.9*   < > 22.0* 21.4* 22.0* 23.3*  MCV 98.9   < > 99.5 100.0 101.4* 99.6  PLT 180   < > 120* 100* 107* 118*   < > = values in this interval not displayed.      Medications:    . darbepoetin (ARANESP) injection - NON-DIALYSIS  60 mcg Subcutaneous Q Wed-1800  . lactulose  20 g Oral BID  . midodrine  10 mg Oral TID WC  . pantoprazole  40 mg Oral BID  . rifaximin  550 mg Oral BID  . sodium bicarbonate  650 mg Oral BID     13/06/21, MD 01/10/2020, 11:49 AM

## 2020-01-13 ENCOUNTER — Telehealth: Payer: Self-pay | Admitting: Internal Medicine

## 2020-01-13 NOTE — Telephone Encounter (Signed)
Spoke with pts spouse. Pt has had a bad headache since pt was released from the hospital Saturday 01/10/2020. Pt hasn't taken anything for his headache and avoids NSAIDS.

## 2020-01-13 NOTE — Telephone Encounter (Signed)
Noted. Spoke with pts spouse. She is aware that pt can take Tylenol 500 mg up to 4 times a day.

## 2020-01-13 NOTE — Telephone Encounter (Signed)
He can try tylenol 500 mg. No more than 2000 mg per 24 hours.

## 2020-01-13 NOTE — Telephone Encounter (Signed)
Pt's wife called asking what can patient take for a headache. Please advise. [phone]

## 2020-01-13 NOTE — Telephone Encounter (Signed)
To clarify. He can take Tylenol 500 mg up to 4 times per day.

## 2020-01-14 ENCOUNTER — Telehealth: Payer: Self-pay | Admitting: Internal Medicine

## 2020-01-14 ENCOUNTER — Other Ambulatory Visit: Payer: Self-pay | Admitting: *Deleted

## 2020-01-14 DIAGNOSIS — R188 Other ascites: Secondary | ICD-10-CM

## 2020-01-14 NOTE — Telephone Encounter (Signed)
1. Patient can have para completed Friday if he feels his abdomen is distended and tight. Per nephrology notes in the hospital, recommended limiting paracentesis to 3L at a time with 25g IV albumin. We will need to place new orders for paras.   Please ask if he has had repeat labs since hospital discharge and when his follow-up with nephrology is.   Of note, patients appointment isn't until 11/30 with Dr. Jena Gauss.     2. Please ask how much lactulose patient is taking currently. If he is taking 42ml BID he can increase to 30 ml TID. If this doesn't help, he can increase to 45 ml TID. Goal of 3-4 Bms daily. Please let us know of any ongoing problems.     [facility] Clinical Pool: If needed, please arrange for paracentesis Friday. Max 3L. 25g IV albumin before para. Labs: Body fluid cell count, culture, and gram stain.

## 2020-01-14 NOTE — Telephone Encounter (Signed)
Spoke with Ermalinda Memos, PA and standing orders for PARA has been cancelled since patient is having change in clinical status.   Called spouse and PARA has been scheduled for 11/12 at 1:15pm, arrival 1:00pm. She voiced understanding.

## 2020-01-14 NOTE — Telephone Encounter (Signed)
Please call patient wife, she has a few questions

## 2020-01-14 NOTE — Telephone Encounter (Signed)
Noted. Spoke with patient's wife. Patient is having slight worsening of his mental status but is still alert and oriented x3. I have advised he go ahead and increase lactulose to 45 mL TID-QID. Wife states patient has said he will not go back to the emergency room.  I explained that if he does not start moving his bowels and his mental status worsens, he will need to be in the emergency department as this can be life-threatening.   [facility] Clinical Pool: Wife would like for orders for para to be placed for Friday around 1pm if possible. Please call and let her know the appointment time.   Alicia: Can we request upcoming lab results to be faxed to be faxed to our office as well?

## 2020-01-14 NOTE — Telephone Encounter (Signed)
Spoke with spouse. She was notified of recommendations of para if abdomen is distended and tight on Friday. Pt is taking 30 ml bid and was given instructions to take 30 ml tid, if not improving, 45 ml tid per Ermalinda Memos, PA. Pt hasn't had labs since his hospital discharge, but is having labs by Friday from his Kidney doctor per pts spouse.

## 2020-01-14 NOTE — Telephone Encounter (Signed)
Spoke with pts spouse. She has a few concerns/questions.  1) Does pt need fluid drawn off on Friday? Pts spouse states he has an apt with Dr. Jena Gauss on Tuesday and spouse wants to know if he should hold off until them. Pts spouse says she doesn't want to put unnecessary harm on pts kidneys if not needed.   2) pt hasn't had a BM since he was discharged from the hospital. Pts spouse states pt was asked to increase his Lactulose if he couldn't have a BM in the hospital. Pts spouse wants to check with our office about the dosage he should take to have a BM.

## 2020-01-14 NOTE — Telephone Encounter (Signed)
Patient missed his follow-up appointment with Dr. Jena Gauss due to being in the hospital.  I would like to get him rescheduled for an office visit with Dr. Jena Gauss within the next 4 weeks.  It looks like Dr. Jena Gauss is an opening at 8 AM on 11/30.  Kennyth Arnold, if patient is agreeable, please arrange.

## 2020-01-14 NOTE — Telephone Encounter (Signed)
Patient is scheduled   

## 2020-01-15 ENCOUNTER — Inpatient Hospital Stay (HOSPITAL_COMMUNITY)
Admission: EM | Admit: 2020-01-15 | Discharge: 2020-01-19 | DRG: 432 | Disposition: A | Discharge status: Hospice | Payer: Self-pay | Attending: Internal Medicine | Admitting: Internal Medicine

## 2020-01-15 ENCOUNTER — Encounter (HOSPITAL_COMMUNITY): Payer: Self-pay | Admitting: Emergency Medicine

## 2020-01-15 ENCOUNTER — Emergency Department (HOSPITAL_COMMUNITY): Payer: Self-pay

## 2020-01-15 ENCOUNTER — Other Ambulatory Visit: Payer: Self-pay

## 2020-01-15 DIAGNOSIS — Z66 Do not resuscitate: Secondary | ICD-10-CM

## 2020-01-15 DIAGNOSIS — K7030 Alcoholic cirrhosis of liver without ascites: Secondary | ICD-10-CM

## 2020-01-15 DIAGNOSIS — R188 Other ascites: Secondary | ICD-10-CM

## 2020-01-15 DIAGNOSIS — N1832 Chronic kidney disease, stage 3b: Secondary | ICD-10-CM | POA: Diagnosis present

## 2020-01-15 DIAGNOSIS — N179 Acute kidney failure, unspecified: Secondary | ICD-10-CM | POA: Diagnosis present

## 2020-01-15 DIAGNOSIS — Z515 Encounter for palliative care: Secondary | ICD-10-CM

## 2020-01-15 DIAGNOSIS — K7200 Acute and subacute hepatic failure without coma: Principal | ICD-10-CM

## 2020-01-15 DIAGNOSIS — E872 Acidosis: Secondary | ICD-10-CM | POA: Diagnosis present

## 2020-01-15 DIAGNOSIS — F32A Depression, unspecified: Secondary | ICD-10-CM | POA: Diagnosis present

## 2020-01-15 DIAGNOSIS — K5900 Constipation, unspecified: Secondary | ICD-10-CM | POA: Diagnosis present

## 2020-01-15 DIAGNOSIS — Z820 Family history of epilepsy and other diseases of the nervous system: Secondary | ICD-10-CM

## 2020-01-15 DIAGNOSIS — D72829 Elevated white blood cell count, unspecified: Secondary | ICD-10-CM | POA: Diagnosis present

## 2020-01-15 DIAGNOSIS — K7040 Alcoholic hepatic failure without coma: Principal | ICD-10-CM | POA: Diagnosis present

## 2020-01-15 DIAGNOSIS — K7460 Unspecified cirrhosis of liver: Secondary | ICD-10-CM | POA: Diagnosis present

## 2020-01-15 DIAGNOSIS — E7220 Disorder of urea cycle metabolism, unspecified: Secondary | ICD-10-CM | POA: Diagnosis present

## 2020-01-15 DIAGNOSIS — Z8249 Family history of ischemic heart disease and other diseases of the circulatory system: Secondary | ICD-10-CM

## 2020-01-15 DIAGNOSIS — K7682 Hepatic encephalopathy: Secondary | ICD-10-CM

## 2020-01-15 DIAGNOSIS — E871 Hypo-osmolality and hyponatremia: Secondary | ICD-10-CM | POA: Diagnosis present

## 2020-01-15 DIAGNOSIS — Z885 Allergy status to narcotic agent status: Secondary | ICD-10-CM

## 2020-01-15 DIAGNOSIS — D631 Anemia in chronic kidney disease: Secondary | ICD-10-CM | POA: Diagnosis present

## 2020-01-15 DIAGNOSIS — Z87891 Personal history of nicotine dependence: Secondary | ICD-10-CM

## 2020-01-15 DIAGNOSIS — R9431 Abnormal electrocardiogram [ECG] [EKG]: Secondary | ICD-10-CM | POA: Diagnosis present

## 2020-01-15 DIAGNOSIS — Z20822 Contact with and (suspected) exposure to covid-19: Secondary | ICD-10-CM | POA: Diagnosis present

## 2020-01-15 DIAGNOSIS — E860 Dehydration: Secondary | ICD-10-CM | POA: Diagnosis present

## 2020-01-15 DIAGNOSIS — D649 Anemia, unspecified: Secondary | ICD-10-CM

## 2020-01-15 DIAGNOSIS — Z7189 Other specified counseling: Secondary | ICD-10-CM

## 2020-01-15 DIAGNOSIS — K7290 Hepatic failure, unspecified without coma: Secondary | ICD-10-CM | POA: Diagnosis present

## 2020-01-15 DIAGNOSIS — K7031 Alcoholic cirrhosis of liver with ascites: Secondary | ICD-10-CM | POA: Diagnosis present

## 2020-01-15 DIAGNOSIS — Z79899 Other long term (current) drug therapy: Secondary | ICD-10-CM

## 2020-01-15 DIAGNOSIS — K767 Hepatorenal syndrome: Secondary | ICD-10-CM | POA: Diagnosis present

## 2020-01-15 LAB — CBC WITH DIFFERENTIAL/PLATELET
Abs Immature Granulocytes: 0.08 10*3/uL — ABNORMAL HIGH (ref 0.00–0.07)
Basophils Absolute: 0 10*3/uL (ref 0.0–0.1)
Basophils Relative: 0 %
Eosinophils Absolute: 0 10*3/uL (ref 0.0–0.5)
Eosinophils Relative: 0 %
HCT: 28 % — ABNORMAL LOW (ref 39.0–52.0)
Hemoglobin: 9.6 g/dL — ABNORMAL LOW (ref 13.0–17.0)
Immature Granulocytes: 1 %
Lymphocytes Relative: 5 %
Lymphs Abs: 0.9 10*3/uL (ref 0.7–4.0)
MCH: 34.4 pg — ABNORMAL HIGH (ref 26.0–34.0)
MCHC: 34.3 g/dL (ref 30.0–36.0)
MCV: 100.4 fL — ABNORMAL HIGH (ref 80.0–100.0)
Monocytes Absolute: 1.3 10*3/uL — ABNORMAL HIGH (ref 0.1–1.0)
Monocytes Relative: 8 %
Neutro Abs: 14.5 10*3/uL — ABNORMAL HIGH (ref 1.7–7.7)
Neutrophils Relative %: 86 %
Platelets: 185 10*3/uL (ref 150–400)
RBC: 2.79 MIL/uL — ABNORMAL LOW (ref 4.22–5.81)
RDW: 14.2 % (ref 11.5–15.5)
WBC: 16.8 10*3/uL — ABNORMAL HIGH (ref 4.0–10.5)
nRBC: 0 % (ref 0.0–0.2)

## 2020-01-15 LAB — COMPREHENSIVE METABOLIC PANEL
ALT: 20 U/L (ref 0–44)
AST: 43 U/L — ABNORMAL HIGH (ref 15–41)
Albumin: 3.8 g/dL (ref 3.5–5.0)
Alkaline Phosphatase: 80 U/L (ref 38–126)
Anion gap: 15 (ref 5–15)
BUN: 44 mg/dL — ABNORMAL HIGH (ref 6–20)
CO2: 20 mmol/L — ABNORMAL LOW (ref 22–32)
Calcium: 9.3 mg/dL (ref 8.9–10.3)
Chloride: 95 mmol/L — ABNORMAL LOW (ref 98–111)
Creatinine, Ser: 3.03 mg/dL — ABNORMAL HIGH (ref 0.61–1.24)
GFR, Estimated: 24 mL/min — ABNORMAL LOW (ref >60)
Glucose, Bld: 121 mg/dL — ABNORMAL HIGH (ref 70–99)
Potassium: 4.1 mmol/L (ref 3.5–5.1)
Sodium: 130 mmol/L — ABNORMAL LOW (ref 135–145)
Total Bilirubin: 4.5 mg/dL — ABNORMAL HIGH (ref 0.3–1.2)
Total Protein: 6.6 g/dL (ref 6.5–8.1)

## 2020-01-15 LAB — URINALYSIS, ROUTINE W REFLEX MICROSCOPIC
Bilirubin Urine: NEGATIVE
Glucose, UA: NEGATIVE mg/dL
Ketones, ur: NEGATIVE mg/dL
Leukocytes,Ua: NEGATIVE
Nitrite: NEGATIVE
Protein, ur: NEGATIVE mg/dL
Specific Gravity, Urine: 1.018 (ref 1.005–1.030)
pH: 5 (ref 5.0–8.0)

## 2020-01-15 LAB — AMMONIA: Ammonia: 100 umol/L — ABNORMAL HIGH (ref 9–35)

## 2020-01-15 LAB — RAPID URINE DRUG SCREEN, HOSP PERFORMED
Amphetamines: NOT DETECTED
Barbiturates: NOT DETECTED
Benzodiazepines: NOT DETECTED
Cocaine: NOT DETECTED
Opiates: NOT DETECTED
Tetrahydrocannabinol: POSITIVE — AB

## 2020-01-15 LAB — LACTIC ACID, PLASMA
Lactic Acid, Venous: 3.2 mmol/L (ref 0.5–1.9)
Lactic Acid, Venous: 3.1 mmol/L (ref 0.5–1.9)

## 2020-01-15 LAB — RESPIRATORY PANEL BY RT PCR (FLU A&B, COVID)
Influenza A by PCR: NEGATIVE
Influenza B by PCR: NEGATIVE
SARS Coronavirus 2 by RT PCR: NEGATIVE

## 2020-01-15 LAB — MAGNESIUM: Magnesium: 2.1 mg/dL (ref 1.7–2.4)

## 2020-01-15 LAB — ETHANOL: Alcohol, Ethyl (B): <10 mg/dL (ref <10)

## 2020-01-15 MED ORDER — LACTULOSE 10 GM/15ML PO SOLN
20.0000 g | Freq: Once | ORAL | Status: AC
  Administered 2020-01-15: 20 g via ORAL
  Filled 2020-01-15: qty 30

## 2020-01-15 MED ORDER — SODIUM CHLORIDE 0.9 % IV SOLN
50.0000 ug/h | INTRAVENOUS | Status: DC
  Administered 2020-01-15 – 2020-01-16 (×2): 50 ug/h via INTRAVENOUS
  Filled 2020-01-15 (×10): qty 1

## 2020-01-15 MED ORDER — ACETAMINOPHEN 500 MG PO TABS: 500.0000 mg | ORAL_TABLET | Freq: Four times a day (QID) | ORAL | Status: DC | PRN

## 2020-01-15 MED ORDER — PROCHLORPERAZINE EDISYLATE 10 MG/2ML IJ SOLN: 10.0000 mg | Freq: Four times a day (QID) | INTRAMUSCULAR | Status: DC | PRN

## 2020-01-15 MED ORDER — SODIUM CHLORIDE 0.9 % IV BOLUS
500.0000 mL | Freq: Once | INTRAVENOUS | Status: AC
  Administered 2020-01-15: 500 mL via INTRAVENOUS

## 2020-01-15 MED ORDER — RIFAXIMIN 550 MG PO TABS
550.0000 mg | ORAL_TABLET | Freq: Two times a day (BID) | ORAL | Status: DC
  Administered 2020-01-15: 550 mg via ORAL
  Filled 2020-01-15 (×2): qty 1

## 2020-01-15 MED ORDER — SODIUM BICARBONATE 650 MG PO TABS
650.0000 mg | ORAL_TABLET | Freq: Two times a day (BID) | ORAL | Status: DC
  Filled 2020-01-15 (×8): qty 1

## 2020-01-15 MED ORDER — PANTOPRAZOLE SODIUM 40 MG IV SOLR: 40.0000 mg | INTRAVENOUS | Status: DC

## 2020-01-15 MED ORDER — SODIUM CHLORIDE 0.9 % IV SOLN: INTRAVENOUS | Status: DC

## 2020-01-15 MED ORDER — PANTOPRAZOLE SODIUM 40 MG PO TBEC
40.0000 mg | DELAYED_RELEASE_TABLET | Freq: Every day | ORAL | Status: DC
  Administered 2020-01-15: 40 mg via ORAL
  Filled 2020-01-15: qty 1

## 2020-01-15 MED ORDER — MIDODRINE HCL 5 MG PO TABS
5.0000 mg | ORAL_TABLET | Freq: Two times a day (BID) | ORAL | Status: DC
  Administered 2020-01-15: 5 mg via ORAL
  Filled 2020-01-15 (×8): qty 1

## 2020-01-15 MED ORDER — LACTULOSE ENEMA
300.0000 mL | Freq: Two times a day (BID) | ORAL | Status: DC
  Administered 2020-01-15 – 2020-01-16 (×2): 300 mL via RECTAL
  Filled 2020-01-15 (×8): qty 300

## 2020-01-15 MED ORDER — ALBUMIN HUMAN 25 % IV SOLN
50.0000 g | Freq: Three times a day (TID) | INTRAVENOUS | Status: AC
  Administered 2020-01-15 – 2020-01-16 (×3): 50 g via INTRAVENOUS
  Filled 2020-01-15 (×6): qty 200

## 2020-01-15 NOTE — H&P (Signed)
History and Physical    [name] UXN:[id number] DOB: Jan 10, 1966 DOA: 01/15/2020  PCP: Patient, No Pcp Per   Patient coming from: Home  I have personally briefly reviewed patient's old medical records in [facility]  Chief Complaint: Altered mental status  HPI: Alan Phillips is a 54 y.o. male with medical history significant of end-stage  liver disease, chronic kidney disease a stage IIIb, gastroesophageal flux disease, grade 1 esophageal varices and recent admission secondary to hepatorenal syndrome.  Who presented to the hospital secondary to worsening mentation and inability to move his bowels at home.  Family reports to be compliant with his medication (specifically lactulose) amylase in the last 24-36 hours prior to admission.  At this moment patient is taking too long to answer questions is confused, oriented mainly to person only and having difficulty maintaining adequate hydration.  There is no fever, no nausea, no vomiting, no overt bleeding, no focal weakness, no chest pain, no cough, no hematemesis, no dysuria or hematuria.  No complaints of abdominal pain.  Positive ascites reported and with pending appointment for outpatient paracentesis planned for 01/16/2020.    ED Course: blood work demonstrating acute on chronic renal failure, lactic acidosis, leukocytosis and dehydration.  Gentle fluids initiated, [facility] call to admit patient for further evaluation and management.  Due to elevated ammonia level lactulose was given.  Chest x-ray without acute cardiopulmonary process.  Abdominal x-ray demonstrated no bowel obstruction.  Review of Systems: As per HPI otherwise all other systems reviewed and are negative.   Past Medical History:  Diagnosis Date  . Cirrhosis of liver (HCC)   . Depression   . Esophageal varices (HCC)    grade 1 (EGD April 2021)  . Hepatic encephalopathy (HCC)    managed with Xifaxan and lactulose  . Psoriasis   . SBP (spontaneous bacterial  peritonitis) (HCC) 07/2019  . Upper GI bleed    April 2021; secondary to careron lesions and portal gastropathy s/p placement of 2 clips    Past Surgical History:  Procedure Laterality Date  . BIOPSY  06/20/2019   Procedure: BIOPSY;  Surgeon: West Bali, MD;  Location: [facility];  Service: Endoscopy;;  gastric  . ESOPHAGEAL BANDING N/A 06/20/2019   Procedure: ESOPHAGEAL BANDING;  Surgeon: West Bali, MD;  Location: [facility];  Service: Endoscopy;  Laterality: N/A;  . ESOPHAGOGASTRODUODENOSCOPY (EGD) WITH PROPOFOL N/A 06/20/2019   Procedure: ESOPHAGOGASTRODUODENOSCOPY (EGD) WITH PROPOFOL;  Surgeon: West Bali, MD;  grade 1 esophageal varices, medium sized hiatal hernia, Cameron erosions, and portal gastropathy with some bleeding which was controlled with application of 2 clips. Bx: Neg H. pylori  . none      Social History  reports that he quit smoking about 13 months ago. His smoking use included cigarettes. He has a 21.00 pack-year smoking history. He quit smokeless tobacco use about 33 years ago. He reports previous alcohol use. He reports current drug use. Drug: Marijuana.  Allergies  Allergen Reactions  . Codeine Hives, Itching and Swelling    Other reaction(s): Dizziness    Family History  Problem Relation Age of Onset  . Cirrhosis Paternal Uncle        etoh  . Cancer Paternal Uncle   . Alcohol abuse Paternal Uncle   . Alzheimer's disease Father   . Heart failure Maternal Uncle     Prior to Admission medications   Medication Sig Start Date End Date Taking? Authorizing Provider  acetaminophen (TYLENOL) 500 MG tablet  Take 500 mg by mouth every 6 (six) hours as needed for headache.   Yes [provider]  baclofen (LIORESAL) 10 MG tablet TAKE 1 TABLET BY MOUTH THREE TIMES DAILY Patient taking differently: Take 10 mg by mouth 3 (three) times daily.  09/09/19  Yes Tiffany KocherLewis, Leslie S, PA-C  CONSTULOSE 10 GM/15ML solution take 30 mls BY MOUTH in THE  morning AND AT BEDTIME Patient taking differently: Take 20 g by mouth daily as needed for mild constipation.  10/16/19  Yes Anice PaganiniGill, Eric A, NP  midodrine (PROAMATINE) 10 MG tablet Take 1 tablet (10 mg total) by mouth 3 (three) times daily with meals. 01/10/20  Yes Tat, Onalee Huaavid, MD  pantoprazole (PROTONIX) 40 MG tablet Take 1 tablet (40 mg total) by mouth 2 (two) times daily before a meal. 09/02/19 09/01/20 Yes Gelene MinkBoone, Anna W, NP  rifaximin (XIFAXAN) 550 MG TABS tablet Take 1 tablet (550 mg total) by mouth 2 (two) times daily. 08/07/19  Yes Tiffany KocherLewis, Leslie S, PA-C  sodium bicarbonate 650 MG tablet Take 1 tablet (650 mg total) by mouth 2 (two) times daily. 01/10/20  Yes Tat, Onalee Huaavid, MD  Zinc 50 MG TABS Take 1 tablet by mouth daily.   Yes [provider]  ondansetron (ZOFRAN) 4 MG tablet Take 1 tablet (4 mg total) by mouth every 8 (eight) hours as needed for nausea or vomiting. Patient not taking: Reported on 01/15/2020 12/23/19   Gelene MinkBoone, Anna W, NP    Physical Exam: Vitals:   01/15/20 1600 01/15/20 1630 01/15/20 1649 01/15/20 1730  BP: 114/88 121/83  118/80  Pulse: (!) 118 (!) 112  (!) 102  Resp: 10 14  11   Temp:   99.5 F (37.5 C)   TempSrc:   Rectal   SpO2: 97% 99%  99%  Weight:        Constitutional: Obtunded, no appropriately following commands and just intermittently able to answer simple questions. Vitals:   01/15/20 1600 01/15/20 1630 01/15/20 1649 01/15/20 1730  BP: 114/88 121/83  118/80  Pulse: (!) 118 (!) 112  (!) 102  Resp: 10 14  11   Temp:   99.5 F (37.5 C)   TempSrc:   Rectal   SpO2: 97% 99%  99%  Weight:       Eyes: PERRL, lids and conjunctivae normal; mild icterus appreciated on exam.  No nystagmus. ENMT: Mucous membranes are dry on examination. Posterior pharynx clear of any exudate or lesions.N Neck: normal, supple, no masses, no thyromegaly. Respiratory: Good air movement bilaterally, no using accessory muscle.  Good oxygen saturation on room air. Cardiovascular:  Mild sinus tachycardia, no rubs, no gallops, no JVD, no murmurs on exam. Abdomen: No tenderness on palpation; positive bowel sounds.  Positive distention and ascites appreciated.  No warm sensation.  Positive umbilical hernia able to be reduced without resistance. Musculoskeletal: no cyanosis or clubbing.  No edema in his lower extremities appreciated. Skin: no rashes, no petechiae. Neurologic: Able to move 4 limbs spontaneously; no focal weakness.  Limited examination due to obtunded state from hepatic encephalopathy. Psychiatric: No agitation, no suicidal ideation or hallucinations.  Difficult to properly assess judgment and insight secondary to acute obtunded state from hepatic encephalopathy.  Labs on Admission: I have personally reviewed following labs and imaging studies  CBC: Recent Labs  Lab 01/09/20 0635 01/10/20 0749 01/15/20 1234  WBC 5.6 6.8 16.8*  NEUTROABS  --   --  14.5*  HGB 7.5* 8.1* 9.6*  HCT 22.0* 23.3* 28.0*  MCV  101.4* 99.6 100.4*  PLT 107* 118* 185    Basic Metabolic Panel: Recent Labs  Lab 01/09/20 0635 01/10/20 0749 01/15/20 1234  NA 129* 128*  128* 130*  K 3.6 3.5  3.5 4.1  CL 99 99  99 95*  CO2 20* 20*  20* 20*  GLUCOSE 103* 125*  123* 121*  BUN 42* 35*  36* 44*  CREATININE 2.23* 1.93*  1.91* 3.03*  CALCIUM 8.6* 8.7*  8.6* 9.3  MG  --   --  2.1  PHOS 3.2 2.7  --     GFR: Estimated Creatinine Clearance: 27.5 mL/min (A) (by C-G formula based on SCr of 3.03 mg/dL (H)).  Liver Function Tests: Recent Labs  Lab 01/09/20 0635 01/10/20 0749 01/15/20 1234  AST 31 29 43*  ALT 18 19 20   ALKPHOS 54 58 80  BILITOT 3.3* 2.9* 4.5*  PROT 5.4* 5.7* 6.6  ALBUMIN 3.4*  3.4* 3.5  3.6 3.8    Urine analysis:    Component Value Date/Time   COLORURINE YELLOW 01/15/2020 1500   APPEARANCEUR HAZY (A) 01/15/2020 1500   LABSPEC 1.018 01/15/2020 1500   PHURINE 5.0 01/15/2020 1500   GLUCOSEU NEGATIVE 01/15/2020 1500   HGBUR SMALL (A) 01/15/2020  1500   BILIRUBINUR NEGATIVE 01/15/2020 1500   KETONESUR NEGATIVE 01/15/2020 1500   PROTEINUR NEGATIVE 01/15/2020 1500   NITRITE NEGATIVE 01/15/2020 1500   LEUKOCYTESUR NEGATIVE 01/15/2020 1500    Radiological Exams on Admission: DG Chest Portable 1 View  Result Date: 01/15/2020 CLINICAL DATA:  Altered mental status.  Cirrhosis. EXAM: PORTABLE CHEST 1 VIEW COMPARISON:  07/25/2019 FINDINGS: Heart size and vascularity normal. Lungs are clear without infiltrate or effusion. No acute skeletal abnormality. IMPRESSION: No active disease. Electronically Signed   By: 07/27/2019 M.D.   On: 01/15/2020 12:33   DG Abd Portable 1 View  Result Date: 01/15/2020 CLINICAL DATA:  Altered mental status.  Cirrhosis. EXAM: PORTABLE ABDOMEN - 1 VIEW COMPARISON:  None. FINDINGS: Normal bowel gas pattern. Distended abdomen likely due to ascites. No urinary tract calculi. IMPRESSION: Probable ascites.  Normal bowel gas pattern. Electronically Signed   By: 13/01/2020 M.D.   On: 01/15/2020 12:33    EKG: Independently reviewed.  Prolonged QT appreciated; no acute ischemic changes.  Assessment/Plan 1-hepatic encephalopathy (HCC) -With elevated ammonia level -Patient with underlying history of end-stage liver disease secondary to alcoholic cirrhosis. -UDS positive for marijuana. -Gentle fluid resuscitation, supportive care and lactulose enema will be provided -Continue rifaximin.  2-Hepatic cirrhosis (HCC) -End-stage and with concern for hepatorenal syndrome component -Holding large volume paracentesis at this time -No diuretics -Continue low-sodium diet while mentation improve -Follow daily weights and strict I's and O's -Continue rifaximin, receiving treatment with lactulose for acute hepatic encephalopathy. -SCDs for DVT prophylaxis. -Meld Score: 32-34 -GI service consulted.  3-Hyponatremia -Chronic and in the setting of cirrhosis -Follow electrolytes trend  4-Prolonged QT interval -Stable  overall -Minimize the use of medications that can further prolong QT -Follow on telemetry.  5-acute renal failure superimposed on stage 3b chronic kidney disease (HCC) -With concern for intravascular depletion and hepatorenal syndrome -Patient started on octreotide, midodrine and albumin infusion -Closely follow renal function trend -Continue the use of sodium bicarbonate -Minimize the use of nephrotoxic agents -Nephrology service consulted.  6-hyperbilirubinemia -Bilirubin 4.9 -In the setting of chronic cirrhosis/end-stage liver disease -No fever and no tenderness palpation in right upper quadrant -Will follow trend.  7-leukocytosis/lactic acidosis -Currently afebrile -Most likely intravascular depletion and dehydration -IV  fluids will be provided -Paracentesis has been ordered studies of ascitic fluid has been sent to rule out SBP -We will follow lactic acid level and WBCs trend -Holding on antibiotics currently, but with a very low threshold to initiate treatment with Rocephin.  DVT prophylaxis: SCDs. Code Status:   Full code. Family Communication:  Wife at bedside. Disposition Plan:   Patient is from:  Home  Anticipated DC to:  Home  Anticipated DC date:  To be determined  Anticipated DC barriers: Improvement in mental status and  stabilization of renal function. Consults called:  Gastroenterology service and nephrology service. Admission status:  Inpatient, length of stay more than 2 midnights; telemetry bed.  Severity of Illness: Moderate severity; patient presented with acute hepatic encephalopathy and hyperammonemia in the setting of end-stage liver disease and alcoholic cirrhosis.  Patient also found with what appears to be hepatorenal syndrome and concern for intravascular dehydration.  Will provide IV fluids, initiate treatment with octreotide, midodrine and albumin infusion.  Due to elevated lactic acid gentle fluid resuscitation will be provided and will check  ascitic fluid to rule out SBP.    Vassie Loll MD [facility]  How to contact the [facility] Attending or Consulting provider 7A - 7P or covering provider during after hours 7P -7A, for this patient?   1. Check the care team in [facility] and look for a) attending/consulting [facility] provider listed and b) the [facility] team listed 2. Log into [email or web address] and use [place] universal password to access. If you do not have the password, please contact the hospital operator. 3. Locate the [facility] provider you are looking for under [facility] and page to a number that you can be directly reached. 4. If you still have difficulty reaching the provider, please page the [facility] (Director on Call) for the Hospitalists listed on amion for assistance.  01/15/2020, 5:52 PM

## 2020-01-15 NOTE — ED Notes (Signed)
Date and time results received: 01/15/20  (use smartphrase ".now" to insert current time)  Test: lactic Critical Value: 3.2  Name of Provider Notified: [place]  Orders Received? Or Actions Taken?:

## 2020-01-15 NOTE — ED Provider Notes (Addendum)
[facility] EMERGENCY DEPARTMENT Provider Note   CSN: [id number] Arrival date & time: 01/15/20  1103     History Chief Complaint  Patient presents with  . Constipation    Alan DonovanJoseph Phillips is a 54 y.o. male.  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  Details per wife's report.  No documented fever or infectious symptoms.  Patient not had a bowel movement since Saturday.  No abdominal pain.  No new medications.  Wife says she has been giving him his normal medications including lactulose.        Past Medical History:  Diagnosis Date  . Cirrhosis of liver (HCC)   . Depression   . Esophageal varices (HCC)    grade 1 (EGD April 2021)  . Hepatic encephalopathy (HCC)    managed with Xifaxan and lactulose  . Psoriasis   . SBP (spontaneous bacterial peritonitis) (HCC) 07/2019  . Upper GI bleed    April 2021; secondary to careron lesions and portal gastropathy s/p placement of 2 clips    Patient Active Problem List   Diagnosis Date Noted  . Hepatic encephalopathy (HCC) 01/15/2020  . Acute renal failure superimposed on stage 3b chronic kidney disease (HCC) 01/07/2020  . Hyperammonemia (HCC)   . Acute on chronic renal failure (HCC) 01/05/2020  . AKI (acute kidney injury) (HCC) 11/14/2019  . H/O: upper GI bleed 10/05/2019  . Nausea with vomiting 10/01/2019  . Rectal bleeding 08/14/2019  . Low blood pressure 08/14/2019  . Ascites due to alcoholic cirrhosis (HCC) 08/14/2019  . SBP (spontaneous bacterial peritonitis) (HCC) 07/25/2019  . Acute GI bleeding 06/19/2019  . Hematemesis 06/18/2019  . Hyponatremia   . Hypokalemia   . Anemia   . Coagulopathy (HCC)   . Prolonged QT interval   . Hepatic cirrhosis (HCC) 05/14/2019  . Anasarca 05/14/2019    Past Surgical History:  Procedure Laterality Date  . BIOPSY  06/20/2019   Procedure:  BIOPSY;  Surgeon: West BaliFields, Sandi L, MD;  Location: [facility] ENDO SUITE;  Service: Endoscopy;;  gastric  . ESOPHAGEAL BANDING N/A 06/20/2019   Procedure: ESOPHAGEAL BANDING;  Surgeon: West BaliFields, Sandi L, MD;  Location: [facility] SUITE;  Service: Endoscopy;  Laterality: N/A;  . ESOPHAGOGASTRODUODENOSCOPY (EGD) WITH PROPOFOL N/A 06/20/2019   Procedure: ESOPHAGOGASTRODUODENOSCOPY (EGD) WITH PROPOFOL;  Surgeon: West BaliFields, Sandi L, MD;  grade 1 esophageal varices, medium sized hiatal hernia, Cameron erosions, and portal gastropathy with some bleeding which was controlled with application of 2 clips. Bx: Neg H. pylori  . none         Family History  Problem Relation Age of Onset  . Cirrhosis Paternal Uncle        etoh  . Cancer Paternal Uncle   . Alcohol abuse Paternal Uncle   . Alzheimer's disease Father   . Heart failure Maternal Uncle     Social History   Tobacco Use  . Smoking status: Former Smoker    Packs/day: 0.50    Years: 42.00    Pack years: 21.00    Types: Cigarettes    Quit date: 12/05/2018    Years since quitting: 1.1  . Smokeless tobacco: Former NeurosurgeonUser    Quit date: 05/14/1986  . Tobacco comment: quit last year  Vaping Use  . Vaping Use: Never used  Substance Use Topics  . Alcohol use: Not Currently    Comment: h/o longtime etoh abuse but quit  04/2019 when he found out he has cirrhosis (05/14/19)  . Drug use: Yes    Types: Marijuana    Comment: few times a week     Home Medications Prior to Admission medications   Medication Sig Start Date End Date Taking? Authorizing Provider  acetaminophen (TYLENOL) 500 MG tablet Take 500 mg by mouth every 6 (six) hours as needed for headache.   Yes [provider]  baclofen (LIORESAL) 10 MG tablet TAKE 1 TABLET BY MOUTH THREE TIMES DAILY Patient taking differently: Take 10 mg by mouth 3 (three) times daily.  09/09/19  Yes Tiffany Kocher, PA-C  CONSTULOSE 10 GM/15ML solution take 30 mls BY MOUTH in THE morning AND AT BEDTIME Patient taking  differently: Take 20 g by mouth daily as needed for mild constipation.  10/16/19  Yes Anice Paganini, NP  midodrine (PROAMATINE) 10 MG tablet Take 1 tablet (10 mg total) by mouth 3 (three) times daily with meals. 01/10/20  Yes Tat, Onalee Hua, MD  pantoprazole (PROTONIX) 40 MG tablet Take 1 tablet (40 mg total) by mouth 2 (two) times daily before a meal. 09/02/19 09/01/20 Yes Gelene Mink, NP  rifaximin (XIFAXAN) 550 MG TABS tablet Take 1 tablet (550 mg total) by mouth 2 (two) times daily. 08/07/19  Yes Tiffany Kocher, PA-C  sodium bicarbonate 650 MG tablet Take 1 tablet (650 mg total) by mouth 2 (two) times daily. 01/10/20  Yes Tat, Onalee Hua, MD  Zinc 50 MG TABS Take 1 tablet by mouth daily.   Yes [provider]  ondansetron (ZOFRAN) 4 MG tablet Take 1 tablet (4 mg total) by mouth every 8 (eight) hours as needed for nausea or vomiting. Patient not taking: Reported on 01/15/2020 12/23/19   Gelene Mink, NP    Allergies    Codeine  Review of Systems   Review of Systems  Unable to perform ROS: Mental status change    Physical Exam Updated Vital Signs BP 122/83   Pulse (!) 118   Resp 11   Wt 72 kg   SpO2 100%   BMI 23.44 kg/m   Physical Exam Vitals and nursing note reviewed.  Constitutional:      Appearance: He is well-developed. He is ill-appearing. He is not diaphoretic.  HENT:     Head: Normocephalic and atraumatic.     Mouth/Throat:     Mouth: Mucous membranes are dry.  Eyes:     General:        Right eye: No discharge.        Left eye: No discharge.     Conjunctiva/sclera: Conjunctivae normal.  Neck:     Trachea: No tracheal deviation.  Cardiovascular:     Rate and Rhythm: Regular rhythm. Tachycardia present.  Pulmonary:     Effort: Pulmonary effort is normal.     Breath sounds: Normal breath sounds.  Abdominal:     General: There is distension.     Palpations: Abdomen is soft.     Tenderness: There is no abdominal tenderness. There is no guarding.  Musculoskeletal:         General: No swelling.     Cervical back: Neck supple. No rigidity.  Skin:    General: Skin is warm.     Capillary Refill: Capillary refill takes less than 2 seconds.     Findings: No erythema.  Neurological:     GCS: GCS eye subscore is 3. GCS verbal subscore is 4. GCS motor subscore is 5.  Comments: General weakness, will not follow commands, brief verbal response to loud verbal stimulation, pupils equal bilateral.  Difficult exam as patient will not follow most commands.  Psychiatric:     Comments: Encephalopathic     ED Results / Procedures / Treatments   Labs (all labs ordered are listed, but only abnormal results are displayed) Labs Reviewed  COMPREHENSIVE METABOLIC PANEL - Abnormal; Notable for the following components:      Result Value   Sodium 130 (*)    Chloride 95 (*)    CO2 20 (*)    Glucose, Bld 121 (*)    BUN 44 (*)    Creatinine, Ser 3.03 (*)    AST 43 (*)    Total Bilirubin 4.5 (*)    GFR, Estimated 24 (*)    All other components within normal limits  CBC WITH DIFFERENTIAL/PLATELET - Abnormal; Notable for the following components:   WBC 16.8 (*)    RBC 2.79 (*)    Hemoglobin 9.6 (*)    HCT 28.0 (*)    MCV 100.4 (*)    MCH 34.4 (*)    Neutro Abs 14.5 (*)    Monocytes Absolute 1.3 (*)    Abs Immature Granulocytes 0.08 (*)    All other components within normal limits  AMMONIA - Abnormal; Notable for the following components:   Ammonia 100 (*)    All other components within normal limits  LACTIC ACID, PLASMA - Abnormal; Notable for the following components:   Lactic Acid, Venous 3.2 (*)    All other components within normal limits  URINE CULTURE  ETHANOL  MAGNESIUM  URINALYSIS, ROUTINE W REFLEX MICROSCOPIC  RAPID URINE DRUG SCREEN, HOSP PERFORMED  LACTIC ACID, PLASMA    EKG None  Radiology DG Chest Portable 1 View  Result Date: 01/15/2020 CLINICAL DATA:  Altered mental status.  Cirrhosis. EXAM: PORTABLE CHEST 1 VIEW COMPARISON:   07/25/2019 FINDINGS: Heart size and vascularity normal. Lungs are clear without infiltrate or effusion. No acute skeletal abnormality. IMPRESSION: No active disease. Electronically Signed   By: Marlan Palau M.D.   On: 01/15/2020 12:33   DG Abd Portable 1 View  Result Date: 01/15/2020 CLINICAL DATA:  Altered mental status.  Cirrhosis. EXAM: PORTABLE ABDOMEN - 1 VIEW COMPARISON:  None. FINDINGS: Normal bowel gas pattern. Distended abdomen likely due to ascites. No urinary tract calculi. IMPRESSION: Probable ascites.  Normal bowel gas pattern. Electronically Signed   By: Marlan Palau M.D.   On: 01/15/2020 12:33    Procedures Procedures (including critical care time)  Medications Ordered in ED Medications  lactulose (CHRONULAC) 10 GM/15ML solution 20 g (20 g Oral Given 01/15/20 1204)  sodium chloride 0.9 % bolus 500 mL (500 mLs Intravenous New Bag/Given 01/15/20 1511)    ED Course  I have reviewed the triage vital signs and the nursing notes.  Pertinent labs & imaging results that were available during my care of the patient were reviewed by me and considered in my medical decision making (see chart for details).    MDM Rules/Calculators/A&P                          Patient presents with acute encephalopathy likely secondary to alcohol cirrhosis and ammonia levels.  Other differentials considered including urine infection, metabolic, acidosis, tox related, other.  Plan for blood work including ammonia levels, urinalysis, urine tox levels, chest x-ray and abdominal x-ray with recent constipation.  Lactulose ordered.  Discussed  plan for admission with significant other.  Blood work reviewed showing sodium 130, creatinine acute elevation 3.03, white blood cell count elevated 16.8 with hemoglobin chronic at 9.6.  Ammonia elevated as expected 100.  Lactic acid 3.2 likely multifactorial.  Discussed with nursing staff to ensure rectal temperature obtained.  Urinalysis pending.  Chest x-ray and  abdominal x-ray no acute dilation, no infiltrate.  Patient has no abdominal pain on exam. IV fluid bolus ordered.  Discussed with hospitalist for admission for further treatment evaluation, lactulose.   Final Clinical Impression(s) / ED Diagnoses Final diagnoses:  Acute hepatic encephalopathy  Alcoholic cirrhosis, unspecified whether ascites present (HCC)  Constipation, unspecified constipation type  Acute renal failure, unspecified acute renal failure type (HCC)  Anemia, unspecified type    Rx / DC Orders ED Discharge Orders    None       Blane Ohara, MD 01/15/20 1527    Blane Ohara, MD 01/15/20 1527

## 2020-01-15 NOTE — Telephone Encounter (Signed)
Noted. Waiting on a return call from pts spouse to see who pts Kidney doctor is.

## 2020-01-15 NOTE — Telephone Encounter (Signed)
Spoke with pts spouse. I was given the number to [facility] to request labs when pt has them done. Pts spouse also mentioned that pt will be going to the ED for evaluation. Labs may be completed when pt goes to the ED. Will follow up for labs.

## 2020-01-15 NOTE — ED Triage Notes (Signed)
Pt arrived by [facility] for constipation. Per ems pt hasnt had BM since Saturday.

## 2020-01-16 ENCOUNTER — Ambulatory Visit (HOSPITAL_COMMUNITY): Admission: RE | Admit: 2020-01-16 | Payer: Self-pay | Source: Ambulatory Visit

## 2020-01-16 ENCOUNTER — Inpatient Hospital Stay (HOSPITAL_COMMUNITY): Payer: Self-pay

## 2020-01-16 DIAGNOSIS — K7290 Hepatic failure, unspecified without coma: Secondary | ICD-10-CM

## 2020-01-16 DIAGNOSIS — K7030 Alcoholic cirrhosis of liver without ascites: Secondary | ICD-10-CM

## 2020-01-16 DIAGNOSIS — Z66 Do not resuscitate: Secondary | ICD-10-CM

## 2020-01-16 DIAGNOSIS — Z7189 Other specified counseling: Secondary | ICD-10-CM

## 2020-01-16 DIAGNOSIS — K7200 Acute and subacute hepatic failure without coma: Secondary | ICD-10-CM

## 2020-01-16 LAB — GLUCOSE, CAPILLARY: Glucose-Capillary: 132 mg/dL — ABNORMAL HIGH (ref 70–99)

## 2020-01-16 LAB — CBC
HCT: 25 % — ABNORMAL LOW (ref 39.0–52.0)
Hemoglobin: 8.4 g/dL — ABNORMAL LOW (ref 13.0–17.0)
MCH: 34.6 pg — ABNORMAL HIGH (ref 26.0–34.0)
MCHC: 33.6 g/dL (ref 30.0–36.0)
MCV: 102.9 fL — ABNORMAL HIGH (ref 80.0–100.0)
Platelets: 127 10*3/uL — ABNORMAL LOW (ref 150–400)
RBC: 2.43 MIL/uL — ABNORMAL LOW (ref 4.22–5.81)
RDW: 14.6 % (ref 11.5–15.5)
WBC: 18.5 10*3/uL — ABNORMAL HIGH (ref 4.0–10.5)
nRBC: 0 % (ref 0.0–0.2)

## 2020-01-16 LAB — COMPREHENSIVE METABOLIC PANEL
ALT: 18 U/L (ref 0–44)
AST: 33 U/L (ref 15–41)
Albumin: 4 g/dL (ref 3.5–5.0)
Alkaline Phosphatase: 65 U/L (ref 38–126)
Anion gap: 13 (ref 5–15)
BUN: 49 mg/dL — ABNORMAL HIGH (ref 6–20)
CO2: 20 mmol/L — ABNORMAL LOW (ref 22–32)
Calcium: 9.1 mg/dL (ref 8.9–10.3)
Chloride: 97 mmol/L — ABNORMAL LOW (ref 98–111)
Creatinine, Ser: 2.68 mg/dL — ABNORMAL HIGH (ref 0.61–1.24)
GFR, Estimated: 27 mL/min — ABNORMAL LOW (ref >60)
Glucose, Bld: 137 mg/dL — ABNORMAL HIGH (ref 70–99)
Potassium: 4.2 mmol/L (ref 3.5–5.1)
Sodium: 130 mmol/L — ABNORMAL LOW (ref 135–145)
Total Bilirubin: 4.6 mg/dL — ABNORMAL HIGH (ref 0.3–1.2)
Total Protein: 6.2 g/dL — ABNORMAL LOW (ref 6.5–8.1)

## 2020-01-16 LAB — URINE CULTURE: Culture: <10000 — AB

## 2020-01-16 LAB — LACTIC ACID, PLASMA: Lactic Acid, Venous: 2.4 mmol/L (ref 0.5–1.9)

## 2020-01-16 MED ORDER — ONDANSETRON 4 MG PO TBDP: 4.0000 mg | ORAL_TABLET | Freq: Four times a day (QID) | ORAL | Status: DC | PRN

## 2020-01-16 MED ORDER — LORAZEPAM 2 MG/ML PO CONC: 1.0000 mg | ORAL | Status: DC | PRN

## 2020-01-16 MED ORDER — SODIUM CHLORIDE 0.9 % IV SOLN: 2.0000 g | INTRAVENOUS | Status: DC

## 2020-01-16 MED ORDER — GLYCOPYRROLATE 0.2 MG/ML IJ SOLN
0.2000 mg | INTRAMUSCULAR | Status: DC | PRN
  Administered 2020-01-16 – 2020-01-17 (×3): 0.2 mg via INTRAVENOUS
  Filled 2020-01-16 (×3): qty 1

## 2020-01-16 MED ORDER — ACETAMINOPHEN 325 MG PO TABS: 650.0000 mg | ORAL_TABLET | Freq: Four times a day (QID) | ORAL | Status: DC | PRN

## 2020-01-16 MED ORDER — LORAZEPAM 2 MG/ML IJ SOLN
1.0000 mg | INTRAMUSCULAR | Status: DC | PRN
  Administered 2020-01-16 – 2020-01-18 (×3): 1 mg via INTRAVENOUS
  Filled 2020-01-16 (×3): qty 1

## 2020-01-16 MED ORDER — HALOPERIDOL LACTATE 5 MG/ML IJ SOLN: 0.5000 mg | INTRAMUSCULAR | Status: DC | PRN

## 2020-01-16 MED ORDER — PANTOPRAZOLE SODIUM 40 MG IV SOLR: 40.0000 mg | Freq: Every day | INTRAVENOUS | Status: DC

## 2020-01-16 MED ORDER — ACETAMINOPHEN 650 MG RE SUPP: 650.0000 mg | Freq: Four times a day (QID) | RECTAL | Status: DC | PRN

## 2020-01-16 MED ORDER — HALOPERIDOL 0.5 MG PO TABS: 0.5000 mg | ORAL_TABLET | ORAL | Status: DC | PRN

## 2020-01-16 MED ORDER — POLYVINYL ALCOHOL 1.4 % OP SOLN: 1.0000 [drp] | Freq: Four times a day (QID) | OPHTHALMIC | Status: DC | PRN

## 2020-01-16 MED ORDER — SCOPOLAMINE 1 MG/3DAYS TD PT72
1.0000 | MEDICATED_PATCH | TRANSDERMAL | Status: DC
  Administered 2020-01-16: 1.5 mg via TRANSDERMAL
  Filled 2020-01-16: qty 1

## 2020-01-16 MED ORDER — LORAZEPAM 2 MG/ML IJ SOLN
2.0000 mg | Freq: Once | INTRAMUSCULAR | Status: AC
  Administered 2020-01-19: 2 mg via INTRAVENOUS
  Filled 2020-01-16: qty 1

## 2020-01-16 MED ORDER — FENTANYL CITRATE (PF) 100 MCG/2ML IJ SOLN
12.5000 ug | INTRAMUSCULAR | Status: DC | PRN
  Administered 2020-01-19: 12.5 ug via INTRAVENOUS
  Filled 2020-01-16: qty 2

## 2020-01-16 MED ORDER — LORAZEPAM 1 MG PO TABS: 1.0000 mg | ORAL_TABLET | ORAL | Status: DC | PRN

## 2020-01-16 MED ORDER — BIOTENE DRY MOUTH MT LIQD: 15.0000 mL | OROMUCOSAL | Status: DC | PRN

## 2020-01-16 MED ORDER — ONDANSETRON HCL 4 MG/2ML IJ SOLN: 4.0000 mg | Freq: Four times a day (QID) | INTRAMUSCULAR | Status: DC | PRN

## 2020-01-16 MED ORDER — GLYCOPYRROLATE 1 MG PO TABS: 1.0000 mg | ORAL_TABLET | ORAL | Status: DC | PRN

## 2020-01-16 MED ORDER — GLYCOPYRROLATE 0.2 MG/ML IJ SOLN: 0.2000 mg | INTRAMUSCULAR | Status: DC | PRN

## 2020-01-16 MED ORDER — HALOPERIDOL LACTATE 2 MG/ML PO CONC: 0.5000 mg | ORAL | Status: DC | PRN

## 2020-01-16 MED ORDER — LORAZEPAM 2 MG/ML IJ SOLN
INTRAMUSCULAR | Status: AC
  Administered 2020-01-16: 2 mg
  Filled 2020-01-16: qty 1

## 2020-01-16 NOTE — ED Notes (Signed)
Waiting for [facility] to bring sodium bicarbonate tab

## 2020-01-16 NOTE — Progress Notes (Signed)
PROGRESS NOTE    Alan DonovanJoseph Phillips  ZOX:[id number] DOB: Apr 30, 1965 DOA: 01/15/2020 PCP: Patient, No Pcp Per    Chief Complaint  Patient presents with  . Constipation    Brief Narrative:  Alan DonovanJoseph Knauff is a 54 y.o. male with medical history significant of end-stage  liver disease, chronic kidney disease a stage IIIb, gastroesophageal flux disease, grade 1 esophageal varices and recent admission secondary to hepatorenal syndrome.  Who presented to the hospital secondary to worsening mentation and inability to move his bowels at home.  Family reports to be compliant with his medication (specifically lactulose) amylase in the last 24-36 hours prior to admission.  At this moment patient is taking too long to answer questions is confused, oriented mainly to person only and having difficulty maintaining adequate hydration.  There is no fever, no nausea, no vomiting, no overt bleeding, no focal weakness, no chest pain, no cough, no hematemesis, no dysuria or hematuria.  No complaints of abdominal pain.  Positive ascites reported and with pending appointment for outpatient paracentesis planned for 01/16/2020.    ED Course: blood work demonstrating acute on chronic renal failure, lactic acidosis, leukocytosis and dehydration.  Gentle fluids initiated, [facility] call to admit patient for further evaluation and management.  Due to elevated ammonia level lactulose was given.  Chest x-ray without acute cardiopulmonary process.  Abdominal x-ray demonstrated no bowel obstruction.  Assessment & Plan: 1-acute hepatic encephalopathy -Elevated ammonia level on presentation -Patient with end-stage liver cirrhosis -UDS was positive for marijuana. -Despite the use of lactulose enema and fluid resuscitation; patient mentation has failed to improve but at this moment after further discussion with family decision to pursue comfort care has been decided. -End-of-life protocol initiated. -Palliative care has been consulted  for hospice candidacy and to assist with symptomatic management.  2-Hepatic cirrhosis (HCC) -Unable to take p.o.'s; rifaximin and lactulose will be discontinue currently -will focus on comfort care only  3-Hyponatremia -chronic in the setting of cirrhosis -from last blood work, close to baseline -no further blood draws anticipated -Patient has been transitioned to comfort care only.  4-Prolonged QT interval -Planning to focus on comfort care -Medication will be used despite potential side effects of prolonged QT -Telemetry to be discontinued.  5-Acute renal failure superimposed on stage 3b chronic kidney disease (HCC) -In the setting of hepatorenal syndrome -No further blood work will be pursued -Plan is to transition patient to comfort care and symptomatic management only. -Overall poor prognosis.  6-DNR/DNI -Extensive discussion with patient's family at bedside (wife and mother) -At this moment they have decided that he has so far enough and understand that his condition is incurable -Patient has been transition to full comfort care -Symptomatic management only -Palliative care consulted to assist with symptomatic management. -If patient's condition stabilizes he is a candidate for residential hospice. -Hospital death anticipated.     DVT prophylaxis: SCDs. Code Status: DNR/DNI. Family Communication: Wife and mother at bedside 01/16/20 Disposition:   Status is: Inpatient  Dispo: The patient is from: Home              Anticipated d/c is to: Anticipate hospital death; in the event of a stabilization will go to hospice home.              Anticipated d/c date is: Anticipate hospital today.              Patient currently no medically stable for discharge; long discussion with family and events of worsening encephalopathic changes and overall  prognosis.  Decision has been made to pursuit comfort care only.  End-of-life care protocol has been initiated.  Medications for  comfort measures in place.   Consultants:   Gastroenterology service  Nephrology service.  Palliative care   Procedures:  See below for x-ray reports  Antimicrobials:  Rocephin   01/16/2020.  Subjective: Patient is still significantly encephalopathic, agitated, unable to be redirected.  Tachycardic on assessment and unable to take any medications by mouth.  No overt bleeding.  Currently afebrile.  Objective: Vitals:   01/16/20 1200 01/16/20 1215 01/16/20 1230 01/16/20 1310  BP: 111/74  126/88 139/84  Pulse: (!) 110 (!) 116 (!) 134 (!) 139  Resp: 13 (!) 22 16 (!) 26  Temp:    98 F (36.7 C)  TempSrc:    Axillary  SpO2: 100% 100% 100% 100%  Weight:        Intake/Output Summary (Last 24 hours) at 01/16/2020 1410 Last data filed at 01/16/2020 1219 Gross per 24 hour  Intake 1691.46 ml  Output --  Net 1691.46 ml   Filed Weights   01/15/20 1121  Weight: 72 kg    Examination:  General exam: Chronically ill in appearance; significantly obtunded, unable to follow commands to safely take any medications or food by mouth.  Positive agitation has been reported by nursing staff.  Unable to be redirected at this time. Respiratory system: Increase gurgling sound in upper airway; positive scattered rhonchi; no wheezing, good oxygen saturation no using accessory muscles. Cardiovascular system: Sinus tachycardia, no rubs, no gallops, no JVD. Gastrointestinal system: Abdomen is distended, with positive fluid wave signs secondary to ascites.  Reducible umbilical hernia; no tenderness on palpation no warm sensation.  Positive bowel sounds.   Central nervous system: Moving 4 limbs spontaneously; unable to properly assess due to encephalopathy. Extremities: No cyanosis or clubbing. Skin: No petechiae. Psychiatry: Unable to assess due to encephalopathy.    Data Reviewed: I have personally reviewed following labs and imaging studies  CBC: Recent Labs  Lab 01/10/20 0749  01/15/20 1234 01/16/20 0433  WBC 6.8 16.8* 18.5*  NEUTROABS  --  14.5*  --   HGB 8.1* 9.6* 8.4*  HCT 23.3* 28.0* 25.0*  MCV 99.6 100.4* 102.9*  PLT 118* 185 127*    Basic Metabolic Panel: Recent Labs  Lab 01/10/20 0749 01/15/20 1234 01/16/20 0433  NA 128*  128* 130* 130*  K 3.5  3.5 4.1 4.2  CL 99  99 95* 97*  CO2 20*  20* 20* 20*  GLUCOSE 125*  123* 121* 137*  BUN 35*  36* 44* 49*  CREATININE 1.93*  1.91* 3.03* 2.68*  CALCIUM 8.7*  8.6* 9.3 9.1  MG  --  2.1  --   PHOS 2.7  --   --     GFR: Estimated Creatinine Clearance: 31.1 mL/min (A) (by C-G formula based on SCr of 2.68 mg/dL (H)).  Liver Function Tests: Recent Labs  Lab 01/10/20 0749 01/15/20 1234 01/16/20 0433  AST 29 43* 33  ALT 19 20 18   ALKPHOS 58 80 65  BILITOT 2.9* 4.5* 4.6*  PROT 5.7* 6.6 6.2*  ALBUMIN 3.5  3.6 3.8 4.0    CBG: Recent Labs  Lab 01/16/20 1309  GLUCAP 132*     Recent Results (from the past 240 hour(s))  Respiratory Panel by RT PCR (Flu A&B, Covid) - Nasopharyngeal Swab     Status: None   Collection Time: 01/15/20  7:30 PM   Specimen: Nasopharyngeal Swab  Result  Value Ref Range Status   SARS Coronavirus 2 by RT PCR NEGATIVE NEGATIVE Final    Comment: (NOTE) SARS-CoV-2 target nucleic acids are NOT DETECTED.  The SARS-CoV-2 RNA is generally detectable in upper respiratoy specimens during the acute phase of infection. The lowest concentration of SARS-CoV-2 viral copies this assay can detect is 131 copies/mL. A negative result does not preclude SARS-Cov-2 infection and should not be used as the sole basis for treatment or other patient management decisions. A negative result may occur with  improper specimen collection/handling, submission of specimen other than nasopharyngeal swab, presence of viral mutation(s) within the areas targeted by this assay, and inadequate number of viral copies (<131 copies/mL). A negative result must be combined with  clinical observations, patient history, and epidemiological information. The expected result is Negative.  Fact Sheet for Patients:  https://www.moore.com/  Fact Sheet for Healthcare Providers:  https://www.young.biz/  This test is no t yet approved or cleared by the [place] FDA and  has been authorized for detection and/or diagnosis of SARS-CoV-2 by FDA under an Emergency Use Authorization (EUA). This EUA will remain  in effect (meaning this test can be used) for the duration of the COVID-19 declaration under Section 564(b)(1) of the Act, 21 U.S.C. section 360bbb-3(b)(1), unless the authorization is terminated or revoked sooner.     Influenza A by PCR NEGATIVE NEGATIVE Final   Influenza B by PCR NEGATIVE NEGATIVE Final    Comment: (NOTE) The Xpert Xpress SARS-CoV-2/FLU/RSV assay is intended as an aid in  the diagnosis of influenza from Nasopharyngeal swab specimens and  should not be used as a sole basis for treatment. Nasal washings and  aspirates are unacceptable for Xpert Xpress SARS-CoV-2/FLU/RSV  testing.  Fact Sheet for Patients: https://www.moore.com/  Fact Sheet for Healthcare Providers: https://www.young.biz/  This test is not yet approved or cleared by the [place] FDA and  has been authorized for detection and/or diagnosis of SARS-CoV-2 by  FDA under an Emergency Use Authorization (EUA). This EUA will remain  in effect (meaning this test can be used) for the duration of the  Covid-19 declaration under Section 564(b)(1) of the Act, 21  U.S.C. section 360bbb-3(b)(1), unless the authorization is  terminated or revoked. Performed at [facility], [address] [place], [place] [address]      Radiology Studies: DG Chest Portable 1 View  Result Date: 01/15/2020 CLINICAL DATA:  Altered mental status.  Cirrhosis. EXAM: PORTABLE CHEST 1 VIEW COMPARISON:  07/25/2019 FINDINGS:  Heart size and vascularity normal. Lungs are clear without infiltrate or effusion. No acute skeletal abnormality. IMPRESSION: No active disease. Electronically Signed   By: Marlan Palau M.D.   On: 01/15/2020 12:33   DG Abd Portable 1 View  Result Date: 01/15/2020 CLINICAL DATA:  Altered mental status.  Cirrhosis. EXAM: PORTABLE ABDOMEN - 1 VIEW COMPARISON:  None. FINDINGS: Normal bowel gas pattern. Distended abdomen likely due to ascites. No urinary tract calculi. IMPRESSION: Probable ascites.  Normal bowel gas pattern. Electronically Signed   By: Marlan Palau M.D.   On: 01/15/2020 12:33   [place] ASCITES (ABDOMEN LIMITED)  Result Date: 01/16/2020 CLINICAL DATA:  Cirrhosis, ascites EXAM: LIMITED ABDOMEN ULTRASOUND FOR ASCITES TECHNIQUE: Limited ultrasound survey for ascites was performed in all four abdominal quadrants. COMPARISON:  01/09/2020 FINDINGS: Moderate ascites identified throughout the abdomen. Volume of ascites identified is sufficient for paracentesis. IMPRESSION: Sufficient ascites for paracentesis. Electronically Signed   By: Ulyses Southward M.D.   On: 01/16/2020 13:57  Scheduled Meds: . lactulose  300 mL Rectal BID  . LORazepam  2 mg Intravenous Once  . midodrine  5 mg Oral BID WC  . pantoprazole (PROTONIX) IV  40 mg Intravenous QHS  . rifaximin  550 mg Oral BID  . sodium bicarbonate  650 mg Oral BID   Continuous Infusions: . albumin human Stopped (01/16/20 0349)  . octreotide  (SANDOSTATIN)    IV infusion 50 mcg/hr (01/16/20 0354)     LOS: 1 day    Time spent: 35 minutes    Vassie Loll, MD [facility]   To contact the attending provider between 7A-7P or the covering provider during after hours 7P-7A, please log into the web site [email or web address] and access using universal [place] password for that web site. If you do not have the password, please call the hospital operator.  01/16/2020, 2:10 PM

## 2020-01-16 NOTE — Consult Note (Signed)
Referring Provider: [facility] Primary Care Physician:  Patient, No Pcp Per Primary Gastroenterologist:  Dr. Jena Gauss  Date of Admission: 01/15/20 Date of Consultation: 01/16/20  Reason for Consultation:  End stage liver disease with hepatorenal syndrome and hepatic encephalopathy  HPI:  Alan Phillips is a 54 y.o. 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 Pollyann Samples. He has not been able to obtain insurance, which has been the main hurdle in transplant evaluation. Pertinent history includes hepatic encephalopathy and on Xifaxan and lactulose, need for frequent LVAPs and diuretic therapy challenging in setting of hyponatremia and hypotension, SBP in May 2021 and on Xifaxan for prophylaxis after discussion with ID (Nephrology recommending avoiding Bactrim and not candidate for Cipro), GI bleed in April 2021 felt multifactorial in setting of Cameron erosions, portal gastropathy, gastritis; he did have Grade 1 varices on exam. He has been unable to tolerate non-selective beta blocker therapy. High MELD precludes TIPS placement. Admitted Sept 2021 with acute renal failure and felt to have hepatorenal syndrome Type 2. [name] had been recommended to be limited to 4 liters; however, he has had recent paras with 8 liters removed prior to recent hospitalization.  Was again admitted to [facility] 01/05/20 - 01/10/20 for ESLD, hepatorenal syndrome, ascites, hyponatremia, metabolic acidosis, hepatic encephalopathy.  He presented at that time for worsening renal function by referral from nephrology.  Noted required increased paracentesis secondary to worsening ascites and increasing use of diuretics, which were discontinued in the setting of worsening creatinine and electrolytes at an outpatient.  At that time he did note multiple bowel movements due to lactulose which she is compliant with.  Decreased appetite and general  malaise.  Renal was consulted and he was started on midodrine, octreotide, and albumin.  Diuretics were held.  His creatinine gradually improved from 3.67-1.9 on discharge.  He did receive paracentesis (8 L x 2) during hospitalization which was negative for SBP.  Nephrology recommendations including continue to avoid nephrotoxic agents, hypotension, and large volume paracentesis.  Recommended to not restart Lasix or Aldactone at time of discharge and follow-up with renal in 1 week as outpatient.  Meld score during admission 32.  Continue to recommend low-sodium diet, Xifaxan and lactulose, PPI, limit any paracentesis to 3 L or less.  He presented to the emergency department again yesterday with worsening confusion and mental status changes without fever or infectious symptoms.  No bowel movement since Saturday despite medications, no abdominal pain or new medications.  In the emergency department the patient was noted to be somewhat confused taking a prolonged time to answer questions, only oriented to person.  Difficulty maintaining adequate hydration.  Previously scheduled outpatient paracentesis for 01/16/2020 due to noted ascites.  Labs in the ED demonstrated again acute on chronic renal failure, lactic acidosis, leukocytosis, dehydration.  Gentle fluids were initiated, patient was admitted.  Chest x-ray normal.  Abdominal x-ray with no bowel obstruction.  CMP with hyponatremia at 130 (essentially near recent baseline), creatinine 3.03, total bilirubin elevated to 4.5, other LFTs essentially normal.  CBC notes leukocytosis of 16.8, stable/slightly improved hemoglobin at 9.6, normal platelets at 185, ammonia elevated at 79 which is since increased to 100.  Ethanol negative.  Lactic acid 3.2, magnesium normal.  SARS-CoV-2 negative.  Wife at bedside.  Today he is essentially nonresponsive.  He does look around the room and reach out but not meaningful ways.  No meaningful eye contact.  Only  grunting/moaning.   Unable to answer questions.  Wife states no bowel movement since Saturday despite lactulose and Xifaxan.  Worsening mental state 3 yesterday when he was essentially nonambulatory and he had to call EMS to come to the hospital.  She is tearful during our discussion.  She is asking if "his numbers can get improved does not buy more time?." We discussed that while his numbers may eventually improve I cannot guarantee how long they would stay that way as an outpatient.  I expressed that we are likely near the end of his time.  She seems like she is slowly coming to terms with this.  She expresses that he is all she really has left.  She asks about his mother coming to see him in the emergency department.  I discussed with the nurse she says she will clear this.  She agreed to a palliative care consult for initial discussions.  No obvious sign of abdominal pain on palpation.  His abdomen is a bit distended and ultrasound is in the room to evaluate for possible paracentesis.  Past Medical History:  Diagnosis Date   Cirrhosis of liver (HCC)    Depression    Esophageal varices (HCC)    grade 1 (EGD April 2021)   Hepatic encephalopathy (HCC)    managed with Xifaxan and lactulose   Psoriasis    SBP (spontaneous bacterial peritonitis) (HCC) 07/2019   Upper GI bleed    April 2021; secondary to careron lesions and portal gastropathy s/p placement of 2 clips    Past Surgical History:  Procedure Laterality Date   BIOPSY  06/20/2019   Procedure: BIOPSY;  Surgeon: West Bali, MD;  Location: [facility];  Service: Endoscopy;;  gastric   ESOPHAGEAL BANDING N/A 06/20/2019   Procedure: ESOPHAGEAL BANDING;  Surgeon: West Bali, MD;  Location: [facility];  Service: Endoscopy;  Laterality: N/A;   ESOPHAGOGASTRODUODENOSCOPY (EGD) WITH PROPOFOL N/A 06/20/2019   Procedure: ESOPHAGOGASTRODUODENOSCOPY (EGD) WITH PROPOFOL;  Surgeon: West Bali, MD;  grade 1 esophageal varices, medium sized  hiatal hernia, Cameron erosions, and portal gastropathy with some bleeding which was controlled with application of 2 clips. Bx: Neg H. pylori   none      Prior to Admission medications   Medication Sig Start Date End Date Taking? Authorizing Provider  acetaminophen (TYLENOL) 500 MG tablet Take 500 mg by mouth every 6 (six) hours as needed for headache.   Yes [provider]  baclofen (LIORESAL) 10 MG tablet TAKE 1 TABLET BY MOUTH THREE TIMES DAILY Patient taking differently: Take 10 mg by mouth 3 (three) times daily.  09/09/19  Yes Tiffany Kocher, PA-C  CONSTULOSE 10 GM/15ML solution take 30 mls BY MOUTH in THE morning AND AT BEDTIME Patient taking differently: Take 20 g by mouth daily as needed for mild constipation.  10/16/19  Yes Anice Paganini, NP  midodrine (PROAMATINE) 10 MG tablet Take 1 tablet (10 mg total) by mouth 3 (three) times daily with meals. 01/10/20  Yes Tat, Onalee Hua, MD  pantoprazole (PROTONIX) 40 MG tablet Take 1 tablet (40 mg total) by mouth 2 (two) times daily before a meal. 09/02/19 09/01/20 Yes Gelene Mink, NP  rifaximin (XIFAXAN) 550 MG TABS tablet Take 1 tablet (550 mg total) by mouth 2 (two) times daily. 08/07/19  Yes Tiffany Kocher, PA-C  sodium bicarbonate 650 MG tablet Take 1 tablet (650 mg total) by mouth 2 (two) times daily. 01/10/20  Yes TatOnalee Hua,  MD  Zinc 50 MG TABS Take 1 tablet by mouth daily.   Yes [provider]  ondansetron (ZOFRAN) 4 MG tablet Take 1 tablet (4 mg total) by mouth every 8 (eight) hours as needed for nausea or vomiting. Patient not taking: Reported on 01/15/2020 12/23/19   Gelene Mink, NP    Current Facility-Administered Medications  Medication Dose Route Frequency Provider Last Rate Last Admin   0.9 %  sodium chloride infusion   Intravenous Continuous Vassie Loll, MD 75 mL/hr at 01/16/20 0350 Rate Verify at 01/16/20 0350   acetaminophen (TYLENOL) tablet 500 mg  500 mg Oral Q6H PRN Vassie Loll, MD       albumin  human 25 % solution 50 g  50 g Intravenous Myriam Jacobson, MD   Stopped at 01/16/20 0349   lactulose (CHRONULAC) enema 200 gm  300 mL Rectal BID Vassie Loll, MD   300 mL at 01/15/20 2236   midodrine (PROAMATINE) tablet 5 mg  5 mg Oral BID WC Vassie Loll, MD   5 mg at 01/15/20 1708   octreotide (SANDOSTATIN) 500 mcg in sodium chloride 0.9 % 250 mL (2 mcg/mL) infusion  50 mcg/hr Intravenous Continuous Vassie Loll, MD 25 mL/hr at 01/16/20 0354 50 mcg/hr at 01/16/20 0354   pantoprazole (PROTONIX) EC tablet 40 mg  40 mg Oral QHS Norva Pavlov, RPH   40 mg at 01/15/20 2233   prochlorperazine (COMPAZINE) injection 10 mg  10 mg Intravenous Q6H PRN Vassie Loll, MD       rifaximin Burman Blacksmith) tablet 550 mg  550 mg Oral BID Vassie Loll, MD   550 mg at 01/15/20 2233   sodium bicarbonate tablet 650 mg  650 mg Oral BID Vassie Loll, MD       Current Outpatient Medications  Medication Sig Dispense Refill   acetaminophen (TYLENOL) 500 MG tablet Take 500 mg by mouth every 6 (six) hours as needed for headache.     baclofen (LIORESAL) 10 MG tablet TAKE 1 TABLET BY MOUTH THREE TIMES DAILY (Patient taking differently: Take 10 mg by mouth 3 (three) times daily. ) 90 tablet 2   CONSTULOSE 10 GM/15ML solution take 30 mls BY MOUTH in THE morning AND AT BEDTIME (Patient taking differently: Take 20 g by mouth daily as needed for mild constipation. ) 1892 mL 3   midodrine (PROAMATINE) 10 MG tablet Take 1 tablet (10 mg total) by mouth 3 (three) times daily with meals. 90 tablet 1   pantoprazole (PROTONIX) 40 MG tablet Take 1 tablet (40 mg total) by mouth 2 (two) times daily before a meal. 60 tablet 5   rifaximin (XIFAXAN) 550 MG TABS tablet Take 1 tablet (550 mg total) by mouth 2 (two) times daily. 60 tablet 5   sodium bicarbonate 650 MG tablet Take 1 tablet (650 mg total) by mouth 2 (two) times daily. 60 tablet 1   Zinc 50 MG TABS Take 1 tablet by mouth daily.     ondansetron  (ZOFRAN) 4 MG tablet Take 1 tablet (4 mg total) by mouth every 8 (eight) hours as needed for nausea or vomiting. (Patient not taking: Reported on 01/15/2020) 60 tablet 3    Allergies as of 01/15/2020 - Review Complete 01/15/2020  Allergen Reaction Noted   Codeine Hives, Itching, and Swelling 05/14/2019    Family History  Problem Relation Age of Onset   Cirrhosis Paternal Uncle        etoh   Cancer Paternal Uncle  Alcohol abuse Paternal Uncle    Alzheimer's disease Father    Heart failure Maternal Uncle     Social History   Socioeconomic History   Marital status: Married    Spouse name: Not on file   Number of children: Not on file   Years of education: Not on file   Highest education level: Not on file  Occupational History   Not on file  Tobacco Use   Smoking status: Former Smoker    Packs/day: 0.50    Years: 42.00    Pack years: 21.00    Types: Cigarettes    Quit date: 12/05/2018    Years since quitting: 1.1   Smokeless tobacco: Former NeurosurgeonUser    Quit date: 05/14/1986   Tobacco comment: quit last year  Vaping Use   Vaping Use: Never used  Substance and Sexual Activity   Alcohol use: Not Currently    Comment: h/o longtime etoh abuse but quit 04/2019 when he found out he has cirrhosis (05/14/19)   Drug use: Yes    Types: Marijuana    Comment: few times a week    Sexual activity: Yes  Other Topics Concern   Not on file  Social History Narrative   Not on file   Social Determinants of Health   Financial Resource Strain:    Difficulty of Paying Living Expenses: Not on file  Food Insecurity:    Worried About Programme researcher, broadcasting/film/videounning Out of Food in the Last Year: Not on file   [employer] Out of Food in the Last Year: Not on file  Transportation Needs:    Lack of Transportation (Medical): Not on file   Lack of Transportation (Non-Medical): Not on file  Physical Activity:    Days of Exercise per Week: Not on file   Minutes of Exercise per Session: Not on file   Stress:    Feeling of Stress : Not on file  Social Connections:    Frequency of Communication with Friends and Family: Not on file   Frequency of Social Gatherings with Friends and Family: Not on file   Attends Religious Services: Not on file   Active Member of Clubs or Organizations: Not on file   Attends BankerClub or Organization Meetings: Not on file   Marital Status: Not on file  Intimate Partner Violence:    Fear of Current or Ex-Partner: Not on file   Emotionally Abused: Not on file   Physically Abused: Not on file   Sexually Abused: Not on file    Review of Systems: Limited due to mental status, assisted by wife GI: See history of present illness. Neuro: Worsening confusion (per wife).   Physical Exam: Vital signs in last 24 hours: Temp:  [99.5 F (37.5 C)] 99.5 F (37.5 C) (11/11 1649) Pulse Rate:  [83-127] 99 (11/12 1000) Resp:  [10-19] 14 (11/12 1000) BP: (96-148)/(57-91) 108/77 (11/12 1000) SpO2:  [96 %-100 %] 100 % (11/12 1000) Weight:  [72 kg] 72 kg (11/11 1121)   General:   Eye open, moaning, no meaningful eye contact or responses/verbalizations. Appears malnourished/cachectic Eyes:  Mild scleral icterus. Ears:  Normal auditory acuity. Neck:  Supple; no masses or thyromegaly. Lungs:  Clear throughout to auscultation. No wheezes, crackles, or rhonchi. No acute distress. Heart:  Regular rate and rhythm; no murmurs, clicks, rubs,  or gallops. Abdomen:  Firm and distended. No masses, hepatosplenomegaly or hernias noted. Normal bowel sounds.   Rectal:  Deferred.   Pulses:  Normal bilateral DP pulses noted. Extremities:  Without clubbing or edema. Neurologic:  Eyes open, no meaningful interaction. Moans/grunting. Skin:  Intact without significant lesions or rashes. Psych:  Alert and cooperative. Normal mood and affect.  Intake/Output from previous day: 11/11 0701 - 11/12 0700 In: 691.5 [IV Piggyback:691.5] Out: -  Intake/Output this shift: No  intake/output data recorded.  Lab Results: Recent Labs    01/15/20 1234 01/16/20 0433  WBC 16.8* 18.5*  HGB 9.6* 8.4*  HCT 28.0* 25.0*  PLT 185 127*   BMET Recent Labs    01/15/20 1234 01/16/20 0433  NA 130* 130*  K 4.1 4.2  CL 95* 97*  CO2 20* 20*  GLUCOSE 121* 137*  BUN 44* 49*  CREATININE 3.03* 2.68*  CALCIUM 9.3 9.1   LFT Recent Labs    01/15/20 1234 01/16/20 0433  PROT 6.6 6.2*  ALBUMIN 3.8 4.0  AST 43* 33  ALT 20 18  ALKPHOS 80 65  BILITOT 4.5* 4.6*   PT/INR No results for input(s): LABPROT, INR in the last 72 hours. Hepatitis Panel No results for input(s): HEPBSAG, HCVAB, HEPAIGM, HEPBIGM in the last 72 hours. C-Diff No results for input(s): CDIFFTOX in the last 72 hours.  Studies/Results: DG Chest Portable 1 View  Result Date: 01/15/2020 CLINICAL DATA:  Altered mental status.  Cirrhosis. EXAM: PORTABLE CHEST 1 VIEW COMPARISON:  07/25/2019 FINDINGS: Heart size and vascularity normal. Lungs are clear without infiltrate or effusion. No acute skeletal abnormality. IMPRESSION: No active disease. Electronically Signed   By: Marlan Palau M.D.   On: 01/15/2020 12:33   DG Abd Portable 1 View  Result Date: 01/15/2020 CLINICAL DATA:  Altered mental status.  Cirrhosis. EXAM: PORTABLE ABDOMEN - 1 VIEW COMPARISON:  None. FINDINGS: Normal bowel gas pattern. Distended abdomen likely due to ascites. No urinary tract calculi. IMPRESSION: Probable ascites.  Normal bowel gas pattern. Electronically Signed   By: Marlan Palau M.D.   On: 01/15/2020 12:33    Impression: Very unfortunate situation of a 54 year old male well-known to our service with a history of alcoholic cirrhosis, end-stage renal disease, hepatorenal syndrome, hepatic encephalopathy.  He was recently admitted for similar complaints including acute on chronic renal failure with discharge creatinine 1.9.  Since discharge last Saturday his wife states he has not had a bowel movement despite continuing to  give lactulose and Xifaxan.  Worsening mental status.  Was unable to ambulate at home and subsequently EMS was called to transport the patient to the emergency department.  In the ED it was found that he likely has hepatic encephalopathy with confusion.  Today he is not having any meaningful response.  His wife is tearful at his bedside.  Acute on chronic renal failure again with a creatinine initially at 3.03, although with gentle hydration this is improved to 2.68 this morning.  Also with hyponatremia and hyperbilirubinemia.  Lactic acidosis somewhat improved this morning although persistent at 2.4.  He does have some worsening leukocytosis with white blood cell count of 18.5, cannot rule out SBP.  Platelets low at 127 this morning, hemoglobin stable compared to baseline over the past 2 weeks at 8.4.  Overall, I feel this is likely the end of the patient's life.  He is having worsening end-stage liver disease with multiple complications including hepatorenal syndrome.  He is not on diuretics at home due to his hepatorenal syndrome.  Leukocytosis and cannot rule out SBP, planned diagnostic paracentesis today at bedside.  Had a very frank discussion with the wife about even if we are able  to improve his situation temporarily in the hospital, will likely rebound rapidly at home as it did this past time.  We discussed the unfortunate cruelty of the need for medical insurance in order to undergo liver transplant.  We also discussed that at this point he may be too sick for liver transplant, although this is not an official opinion.  After discussion, she agrees to palliative care consult.  She would like to have his mom come visit him at bedside, despite a poor relationship.  The nurse states this would be okay.  Plan: 1. Diagnostic paracentesis per recommendations today 2. Continue rectal lactulose twice daily to try to improve mental status 3. Appreciate nephrology consult and will defer diuretic  recommendations to them 4. Continue octreotide 5. We will switch Protonix to IV given mental status 6. We will likely need to evaluate medications for what can be converted to IV given mental status and risk for aspiration pneumonia 7. Likewise plan NPO for now until mental status improves 8. Supportive measures 9. Palliative care consult 10. Further recommendations to follow 11. Any paracentesis should likely be kept less than 3 L based on previous recommendations from nephrology prevent worsening kidney function 12. Continue IV Albumin q 8 hours for now   Thank you for allowing [place] to participate in the care of [facility]  Wynne Dust, DNP, AGNP-C Adult & Gerontological Nurse Practitioner [facility]   LOS: 1 day     01/16/2020, 10:23 AM

## 2020-01-16 NOTE — Progress Notes (Signed)
Patient came to his room obtunded and a MEWS of 5. Full code. Rapid was called. Dr. Gwenlyn Perking and Dr. Kerry Hough spoke with the wife about code status and goals of care. Wife wanted other family members to come to patient's room before she made any decisions about code status. Once wife discussed with family she decided to make patient a DNR, then after further discussion with chaplain and doctor, wife decided to make patient comfort care. Supportive care was given to patient and family. Patient resting comfortably.

## 2020-01-16 NOTE — Progress Notes (Signed)
   01/16/20 1310  Assess: MEWS Score  Temp 98 F (36.7 C)  BP 139/84 (4)  Pulse Rate (!) 139  Resp (!) 26  Level of Consciousness Alert  SpO2 100 %  O2 Device Room Air  Assess: MEWS Score  MEWS Temp 0  MEWS Systolic 0  MEWS Pulse 3  MEWS RR 2  MEWS LOC 0  MEWS Score 5  MEWS Score Color Red  Assess: if the MEWS score is Yellow or Red  Were vital signs taken at a resting state? Yes  Focused Assessment No change from prior assessment (per report from nursing and wife at bedside)  Early Detection of Sepsis Score *See Row Information* High  MEWS guidelines implemented *See Row Information* Yes  Treat  MEWS Interventions Escalated (See documentation below);Other (Comment) (rapid response, provider notified)  Take Vital Signs  Increase Vital Sign Frequency  Red: Q 1hr X 4 then Q 4hr X 4, if remains red, continue Q 4hrs  Escalate  MEWS: Escalate Red: discuss with charge nurse/RN and provider, consider discussing with RRT  Notify: Charge Nurse/RN  Name of Charge Nurse/RN Notified shannon brown, RN  Date Charge Nurse/RN Notified 01/16/20  Time Charge Nurse/RN Notified 1310  Notify: Provider  Provider Name/Title Vassie Loll and Erick Blinks (who responded to rapid)  Date Provider Notified 01/16/20  Time Provider Notified 1310  Notification Type Page  Notification Reason Change in status  Response See new orders  Date of Provider Response 01/16/20  Time of Provider Response 1315  Notify: Rapid Response  Name of Rapid Response RN Notified mary mills, RN  Date Rapid Response Notified 01/16/20  Time Rapid Response Notified 1310

## 2020-01-16 NOTE — Progress Notes (Signed)
Palliative Medicine RN Note: Consult order noted.  Palliative provider is due to return to [facility] on Monday 11/15, and Mr Vanauken will be evaluated at that time. If recommendations are needed in the interim, please call our office at [phone].  Margret Chance Declan Mier, RN, BSN, [facility] Palliative Medicine Team 01/16/2020 1:35 PM Office [phone]

## 2020-01-16 NOTE — Progress Notes (Signed)
Responded to rapid response on this patient  On my arrival, patient is laying in bed, appears agitated, staff and patient's wife are holding his arms.  On vitals, he is noted to be tachycardic with a heart rate in the 130s to 140s.  Blood pressure in the 130s.  Patient does not follow commands.  He is persistently moaning.  Lorazepam 1 mg IV x1 dose ordered for agitation.  Chart was briefly reviewed.    Patient has been chronically ill and on a steady decline.  He was recently discharged from the hospital on 11/6.  He has end-stage liver disease, chronic kidney disease stage IIIb, hepatorenal syndrome, admitted to the hospital with worsening renal function and hepatic encephalopathy.  Palliative care has been consulted to help address goals of care.  I have had an honest conversation with the patient's wife.  We addressed advanced directives and CODE STATUS.  Considering his poor health, poor long-term prognosis and frail condition, I advised against heroic measures such as CPR/intubation.  I did recommend DNR status.  Patient's wife is agreeable.  DNR order has been placed in his chart.  Further conversations around goals of care/hospice will be made by patient's primary attending Dr. Gwenlyn Perking as well as palliative care.  Time spent:  [employer]

## 2020-01-17 DIAGNOSIS — Z515 Encounter for palliative care: Secondary | ICD-10-CM

## 2020-01-17 LAB — COMPREHENSIVE METABOLIC PANEL
ALT: 17 U/L (ref 0–44)
AST: 34 U/L (ref 15–41)
Albumin: 3.7 g/dL (ref 3.5–5.0)
Alkaline Phosphatase: 63 U/L (ref 38–126)
Anion gap: 12 (ref 5–15)
BUN: 54 mg/dL — ABNORMAL HIGH (ref 6–20)
CO2: 21 mmol/L — ABNORMAL LOW (ref 22–32)
Calcium: 9 mg/dL (ref 8.9–10.3)
Chloride: 101 mmol/L (ref 98–111)
Creatinine, Ser: 2.07 mg/dL — ABNORMAL HIGH (ref 0.61–1.24)
GFR, Estimated: 37 mL/min — ABNORMAL LOW (ref >60)
Glucose, Bld: 84 mg/dL (ref 70–99)
Potassium: 4 mmol/L (ref 3.5–5.1)
Sodium: 134 mmol/L — ABNORMAL LOW (ref 135–145)
Total Bilirubin: 4.9 mg/dL — ABNORMAL HIGH (ref 0.3–1.2)
Total Protein: 5.7 g/dL — ABNORMAL LOW (ref 6.5–8.1)

## 2020-01-17 LAB — CBC
HCT: 26.3 % — ABNORMAL LOW (ref 39.0–52.0)
Hemoglobin: 8.8 g/dL — ABNORMAL LOW (ref 13.0–17.0)
MCH: 33.8 pg (ref 26.0–34.0)
MCHC: 33.5 g/dL (ref 30.0–36.0)
MCV: 101.2 fL — ABNORMAL HIGH (ref 80.0–100.0)
Platelets: 148 10*3/uL — ABNORMAL LOW (ref 150–400)
RBC: 2.6 MIL/uL — ABNORMAL LOW (ref 4.22–5.81)
RDW: 14.6 % (ref 11.5–15.5)
WBC: 11.9 10*3/uL — ABNORMAL HIGH (ref 4.0–10.5)
nRBC: 0 % (ref 0.0–0.2)

## 2020-01-17 NOTE — Plan of Care (Signed)

## 2020-01-17 NOTE — Progress Notes (Signed)
Pt wife requesting if it would be possible to move pt to home hospice as she stated it would be his wishes to pass at home instead of in a hospital room.  Wife notified that it will be passed along to palliative care to see what can be done.

## 2020-01-17 NOTE — Progress Notes (Signed)
PROGRESS NOTE    Alan Phillips  ATF:[id number] DOB: Sep 02, 1965 DOA: 01/15/2020 PCP: Patient, No Pcp Per    Chief Complaint  Patient presents with  . Constipation    Brief Narrative:  Alan Phillips is a 54 y.o. male with medical history significant of end-stage  liver disease, chronic kidney disease a stage IIIb, gastroesophageal flux disease, grade 1 esophageal varices and recent admission secondary to hepatorenal syndrome.  Who presented to the hospital secondary to worsening mentation and inability to move his bowels at home.  Family reports to be compliant with his medication (specifically lactulose) amylase in the last 24-36 hours prior to admission.  At this moment patient is taking too long to answer questions is confused, oriented mainly to person only and having difficulty maintaining adequate hydration.  There is no fever, no nausea, no vomiting, no overt bleeding, no focal weakness, no chest pain, no cough, no hematemesis, no dysuria or hematuria.  No complaints of abdominal pain.  Positive ascites reported and with pending appointment for outpatient paracentesis planned for 01/16/2020.    ED Course: blood work demonstrating acute on chronic renal failure, lactic acidosis, leukocytosis and dehydration.  Gentle fluids initiated, TRH call to admit patient for further evaluation and management.  Due to elevated ammonia level lactulose was given.  Chest x-ray without acute cardiopulmonary process.  Abdominal x-ray demonstrated no bowel obstruction.  Assessment & Plan: 1-acute hepatic encephalopathy -Elevated ammonia level on presentation -Patient with end-stage liver cirrhosis -UDS was positive for marijuana. -Despite the use of lactulose enema and fluid resuscitation; patient mentation has failed to improve but at this moment after further discussion with family decision to pursue comfort care has been decided. -End-of-life protocol initiated. -Palliative care has been consulted  for hospice candidacy and to assist with symptomatic management.  2-Hepatic cirrhosis (HCC) -Unable to take p.o.'s; rifaximin and lactulose will be discontinue currently -will focus on comfort care only  3-Hyponatremia -chronic in the setting of cirrhosis -from last blood work, close to baseline -no further blood draws anticipated -Patient has been transitioned to comfort care only.  4-Prolonged QT interval -Planning to focus on comfort care -Medication will be used despite potential side effects of prolonged QT -Telemetry to be discontinued.  5-Acute renal failure superimposed on stage 3b chronic kidney disease (HCC) -In the setting of hepatorenal syndrome -No further blood work will be pursued -Plan is to transition patient to comfort care and symptomatic management only. -Overall poor prognosis.  6-DNR/DNI -Extensive discussion with patient's family at bedside (wife and mother) -At this moment they have decided that he has so far enough and understand that his condition is incurable -Patient has been transition to full comfort care -Symptomatic management only -Palliative care consulted to assist with symptomatic management. -If patient's condition stabilizes he is a candidate for residential hospice. -Hospital death anticipated. -hospice requested for GIP vs hospice home is remains stable VS wise.     DVT prophylaxis: SCDs. Code Status: DNR/DNI. Family Communication: Wife and mother at bedside 01/17/20 Disposition:   Status is: Inpatient  Dispo: The patient is from: Home              Anticipated d/c is to: Anticipate hospital death; in the event of a stabilization will go to hospice home.              Anticipated d/c date is: Anticipate hospital death.              Patient currently in terminal illness state. Will  need residential hospice Vs hospital death. Continue comfort care.   Consultants:   Gastroenterology service  Nephrology service.  Palliative  care   Procedures:  See below for x-ray reports  Antimicrobials:  Rocephin   01/16/2020.  Subjective: No eating, not drinking, not following commands, obtunded and with increase upper airways secretion sounds.  Objective: Vitals:   01/16/20 1215 01/16/20 1230 01/16/20 1310 01/17/20 0649  BP:  126/88 139/84 (!) 99/59  Pulse: (!) 116 (!) 134 (!) 139 (!) 114  Resp: (!) 22 16 (!) 26 (!) 24  Temp:   98 F (36.7 C) 99.5 F (37.5 C)  TempSrc:   Axillary Axillary  SpO2: 100% 100% 100% (!) 85%  Weight:        Intake/Output Summary (Last 24 hours) at 01/17/2020 1813 Last data filed at 01/17/2020 0500 Gross per 24 hour  Intake 571.3 ml  Output --  Net 571.3 ml   Filed Weights   01/15/20 1121  Weight: 72 kg    Examination: General exam: No fever, remains obtunded, nonresponsive, no following commands.  Not talking or tolerating anything by mouth.  Still having upper airway secretions sounds on examination. Respiratory system: Positive rhonchi bilaterally; no using accessory muscle. Cardiovascular system: Sinus tachycardia, no rubs, no gallop. Gastrointestinal system: Abdomen is distended, with positive bowel sounds and with positive fluid wave from ascites.  Central nervous system: Unable to properly assess secondary to current obtundation/altered mentation. Extremities: No cyanosis or clubbing; no lower extremity edema. Skin: No petechiae.   Data Reviewed: I have personally reviewed following labs and imaging studies  CBC: Recent Labs  Lab 01/15/20 1234 01/16/20 0433 01/17/20 0904  WBC 16.8* 18.5* 11.9*  NEUTROABS 14.5*  --   --   HGB 9.6* 8.4* 8.8*  HCT 28.0* 25.0* 26.3*  MCV 100.4* 102.9* 101.2*  PLT 185 127* 148*    Basic Metabolic Panel: Recent Labs  Lab 01/15/20 1234 01/16/20 0433 01/17/20 0904  NA 130* 130* 134*  K 4.1 4.2 4.0  CL 95* 97* 101  CO2 20* 20* 21*  GLUCOSE 121* 137* 84  BUN 44* 49* 54*  CREATININE 3.03* 2.68* 2.07*  CALCIUM 9.3 9.1  9.0  MG 2.1  --   --     GFR: Estimated Creatinine Clearance: 40.3 mL/min (A) (by C-G formula based on SCr of 2.07 mg/dL (H)).  Liver Function Tests: Recent Labs  Lab 01/15/20 1234 01/16/20 0433 01/17/20 0904  AST 43* 33 34  ALT 20 18 17   ALKPHOS 80 65 63  BILITOT 4.5* 4.6* 4.9*  PROT 6.6 6.2* 5.7*  ALBUMIN 3.8 4.0 3.7    CBG: Recent Labs  Lab 01/16/20 1309  GLUCAP 132*     Recent Results (from the past 240 hour(s))  Urine culture     Status: Abnormal   Collection Time: 01/15/20  3:00 PM   Specimen: Urine, Clean Catch  Result Value Ref Range Status   Specimen Description   Final    URINE, CLEAN CATCH Performed at [facility], [address] [place], [place]    Special Requests   Final    NONE Performed at [facility], [address], [place], [place]    Culture (A)  Final    <10,000 COLONIES/mL INSIGNIFICANT GROWTH Performed at [facility], [address] [place], [place]    Report Status 01/16/2020 FINAL  Final  Respiratory Panel by RT PCR (Flu A&B, Covid) - Nasopharyngeal Swab  Status: None   Collection Time: 01/15/20  7:30 PM   Specimen: Nasopharyngeal Swab  Result Value Ref Range Status   SARS Coronavirus 2 by RT PCR NEGATIVE NEGATIVE Final    Comment: (NOTE) SARS-CoV-2 target nucleic acids are NOT DETECTED.  The SARS-CoV-2 RNA is generally detectable in upper respiratoy specimens during the acute phase of infection. The lowest concentration of SARS-CoV-2 viral copies this assay can detect is 131 copies/mL. A negative result does not preclude SARS-Cov-2 infection and should not be used as the sole basis for treatment or other patient management decisions. A negative result may occur with  improper specimen collection/handling, submission of specimen other than nasopharyngeal swab, presence of viral mutation(s) within the areas targeted by this assay, and inadequate number of viral copies (<131 copies/mL). A  negative result must be combined with clinical observations, patient history, and epidemiological information. The expected result is Negative.  Fact Sheet for Patients:  https://www.moore.com/  Fact Sheet for Healthcare Providers:  https://www.young.biz/  This test is no t yet approved or cleared by the [place] FDA and  has been authorized for detection and/or diagnosis of SARS-CoV-2 by FDA under an Emergency Use Authorization (EUA). This EUA will remain  in effect (meaning this test can be used) for the duration of the COVID-19 declaration under Section 564(b)(1) of the Act, 21 U.S.C. section 360bbb-3(b)(1), unless the authorization is terminated or revoked sooner.     Influenza A by PCR NEGATIVE NEGATIVE Final   Influenza B by PCR NEGATIVE NEGATIVE Final    Comment: (NOTE) The Xpert Xpress SARS-CoV-2/FLU/RSV assay is intended as an aid in  the diagnosis of influenza from Nasopharyngeal swab specimens and  should not be used as a sole basis for treatment. Nasal washings and  aspirates are unacceptable for Xpert Xpress SARS-CoV-2/FLU/RSV  testing.  Fact Sheet for Patients: https://www.moore.com/  Fact Sheet for Healthcare Providers: https://www.young.biz/  This test is not yet approved or cleared by the [place] FDA and  has been authorized for detection and/or diagnosis of SARS-CoV-2 by  FDA under an Emergency Use Authorization (EUA). This EUA will remain  in effect (meaning this test can be used) for the duration of the  Covid-19 declaration under Section 564(b)(1) of the Act, 21  U.S.C. section 360bbb-3(b)(1), unless the authorization is  terminated or revoked. Performed at [facility], [address] [place], [place] [address]      Radiology Studies: [place] ASCITES (ABDOMEN LIMITED)  Result Date: 01/16/2020 CLINICAL DATA:  Cirrhosis, ascites EXAM: LIMITED ABDOMEN ULTRASOUND FOR  ASCITES TECHNIQUE: Limited ultrasound survey for ascites was performed in all four abdominal quadrants. COMPARISON:  01/09/2020 FINDINGS: Moderate ascites identified throughout the abdomen. Volume of ascites identified is sufficient for paracentesis. IMPRESSION: Sufficient ascites for paracentesis. Electronically Signed   By: Ulyses Southward M.D.   On: 01/16/2020 13:57    Scheduled Meds: . LORazepam  2 mg Intravenous Once  . scopolamine  1 patch Transdermal Q72H   Continuous Infusions:    LOS: 2 days    Time spent: 25 minutes    Vassie Loll, MD [facility]   To contact the attending provider between 7A-7P or the covering provider during after hours 7P-7A, please log into the web site [email or web address] and access using universal [place] password for that web site. If you do not have the password, please call the hospital operator.  01/17/2020, 6:13 PM

## 2020-01-18 NOTE — Progress Notes (Signed)
PROGRESS NOTE    Alan Phillips  DTO:[id number] DOB: Nov 15, 1965 DOA: 01/15/2020 PCP: Patient, No Pcp Per    Chief Complaint  Patient presents with  . Constipation    Brief Narrative:  Alan Phillips is a 54 y.o. male with medical history significant of end-stage  liver disease, chronic kidney disease a stage IIIb, gastroesophageal flux disease, grade 1 esophageal varices and recent admission secondary to hepatorenal syndrome.  Who presented to the hospital secondary to worsening mentation and inability to move his bowels at home.  Family reports to be compliant with his medication (specifically lactulose) amylase in the last 24-36 hours prior to admission.  At this moment patient is taking too long to answer questions is confused, oriented mainly to person only and having difficulty maintaining adequate hydration.  There is no fever, no nausea, no vomiting, no overt bleeding, no focal weakness, no chest pain, no cough, no hematemesis, no dysuria or hematuria.  No complaints of abdominal pain.  Positive ascites reported and with pending appointment for outpatient paracentesis planned for 01/16/2020.    ED Course: blood work demonstrating acute on chronic renal failure, lactic acidosis, leukocytosis and dehydration.  Gentle fluids initiated, TRH call to admit patient for further evaluation and management.  Due to elevated ammonia level lactulose was given.  Chest x-ray without acute cardiopulmonary process.  Abdominal x-ray demonstrated no bowel obstruction.  Assessment & Plan: 1-acute hepatic encephalopathy -Elevated ammonia level on presentation -Patient with end-stage liver cirrhosis -UDS was positive for marijuana. -Despite the use of lactulose enema and fluid resuscitation; patient mentation has failed to improved back to baseline; after further discussion with family member decision was made to proceed with comfort care and hospice only. -End-of-life protocol initiated; on 01/18/2020 he  has regained some consciousness but continued to be significantly weak, deconditioned and not doing great. -After talking with wife at bedside decision has been made to advance diet to full liquid and assess his tolerance with intention to pursued discharge home with hospice follow-up on 01/19/2020.  -Palliative care has been consulted for hospice candidacy and to assist with symptomatic management.  2-Hepatic cirrhosis (HCC) -Unable to take p.o.'s; rifaximin and lactulose will be discontinue currently -will focus on comfort care only  3-Hyponatremia -chronic in the setting of cirrhosis -from last blood work, close to baseline -no further blood draws anticipated -Patient has been transitioned to comfort care only.  4-Prolonged QT interval -Planning to focus on comfort care -Medication will be used despite potential side effects of prolonged QT -Telemetry to be discontinued.  5-Acute renal failure superimposed on stage 3b chronic kidney disease (HCC) -In the setting of hepatorenal syndrome -No further blood work will be pursued -Plan is to transition patient to comfort care and symptomatic management only. -Overall poor prognosis.  6-DNR/DNI -Extensive discussion with patient's family at bedside (wife and mother) -At this moment they have decided that he has so far enough and understand that his condition is incurable -Patient has been transition to full comfort care -Symptomatic management only -Palliative care consulted to assist with symptomatic management. -If patient's condition stabilizes he is a candidate for residential hospice. -Hospital death anticipated. -hospice requested for GIP vs hospice at home if he remains stable and family feels that they can provide care to her in. -Discussion about equipment needed to have a TOC on 01/19/2020 prior to discharge.   DVT prophylaxis: SCDs. Code Status: DNR/DNI. Family Communication: Wife and mother at bedside  01/17/20 Disposition:   Status is: Inpatient  Dispo:  The patient is from: Home              Anticipated d/c is to: Anticipate hospital death; in the event of a stabilization will go to hospice home.              Anticipated d/c date is: Anticipate hospital death.              Patient currently in terminal illness state. Will need residential hospice Vs hospital death. Continue comfort care.   Consultants:   Gastroenterology service  Nephrology service.  Palliative care   Procedures:  See below for x-ray reports  Antimicrobials:  Rocephin   01/16/2020.  Subjective: Patient is opening his eyes and following simple commands by family members; is still slow to respond.  No chest pain, no nausea, no shortness of breath currently.  Improvement in previous increased upper airway secretions appreciated.  Objective: Vitals:   01/16/20 1230 01/16/20 1310 01/17/20 0649 01/17/20 2248  BP: 126/88 139/84 (!) 99/59 112/70  Pulse: (!) 134 (!) 139 (!) 114 (!) 121  Resp: 16 (!) 26 (!) 24 (!) 23  Temp:  98 F (36.7 C) 99.5 F (37.5 C) 99.7 F (37.6 C)  TempSrc:  Axillary Axillary Axillary  SpO2: 100% 100% (!) 85% 93%  Weight:       No intake or output data in the 24 hours ending 01/18/20 0927 Filed Weights   01/15/20 1121  Weight: 72 kg    Examination: General exam: Awake, following simple commands and in no major distress.  Dry mouth have been reported by wife at bedside.  Improvement in upper airway secretions clearance. Respiratory system: Positive scattered rhonchi; no wheezing, no using accessory muscles. Cardiovascular system: Sinus tachycardia.  No JVD. Gastrointestinal system: Abdomen is nontender to palpation; distended with positive ascites. Central nervous system: Limited assessment with current mentation. Extremities: No cyanosis or clubbing. Skin: No petechiae. Psychiatry: Limited assessment with current mentation; no agitation, no hallucinations.  Slow to respond.     Data Reviewed: I have personally reviewed following labs and imaging studies  CBC: Recent Labs  Lab 01/15/20 1234 01/16/20 0433 01/17/20 0904  WBC 16.8* 18.5* 11.9*  NEUTROABS 14.5*  --   --   HGB 9.6* 8.4* 8.8*  HCT 28.0* 25.0* 26.3*  MCV 100.4* 102.9* 101.2*  PLT 185 127* 148*    Basic Metabolic Panel: Recent Labs  Lab 01/15/20 1234 01/16/20 0433 01/17/20 0904  NA 130* 130* 134*  K 4.1 4.2 4.0  CL 95* 97* 101  CO2 20* 20* 21*  GLUCOSE 121* 137* 84  BUN 44* 49* 54*  CREATININE 3.03* 2.68* 2.07*  CALCIUM 9.3 9.1 9.0  MG 2.1  --   --     GFR: Estimated Creatinine Clearance: 40.3 mL/min (A) (by C-G formula based on SCr of 2.07 mg/dL (H)).  Liver Function Tests: Recent Labs  Lab 01/15/20 1234 01/16/20 0433 01/17/20 0904  AST 43* 33 34  ALT 20 18 17   ALKPHOS 80 65 63  BILITOT 4.5* 4.6* 4.9*  PROT 6.6 6.2* 5.7*  ALBUMIN 3.8 4.0 3.7    CBG: Recent Labs  Lab 01/16/20 1309  GLUCAP 132*     Recent Results (from the past 240 hour(s))  Urine culture     Status: Abnormal   Collection Time: 01/15/20  3:00 PM   Specimen: Urine, Clean Catch  Result Value Ref Range Status   Specimen Description   Final    URINE, CLEAN CATCH Performed at [facility], [address] [place], [place] [address]    Special Requests   Final    NONE Performed at [facility], [address] [place], [place] [address]    Culture (A)  Final    <10,000 COLONIES/mL INSIGNIFICANT GROWTH Performed at [facility], [address] [place], [place] [address]    Report Status 01/16/2020 FINAL  Final  Respiratory Panel by RT PCR (Flu A&B, Covid) - Nasopharyngeal Swab     Status: None   Collection Time: 01/15/20  7:30 PM   Specimen: Nasopharyngeal Swab  Result Value Ref Range Status   SARS Coronavirus 2 by RT PCR NEGATIVE NEGATIVE Final    Comment: (NOTE) SARS-CoV-2 target nucleic acids are NOT DETECTED.  The SARS-CoV-2 RNA is generally detectable in upper  respiratoy specimens during the acute phase of infection. The lowest concentration of SARS-CoV-2 viral copies this assay can detect is 131 copies/mL. A negative result does not preclude SARS-Cov-2 infection and should not be used as the sole basis for treatment or other patient management decisions. A negative result may occur with  improper specimen collection/handling, submission of specimen other than nasopharyngeal swab, presence of viral mutation(s) within the areas targeted by this assay, and inadequate number of viral copies (<131 copies/mL). A negative result must be combined with clinical observations, patient history, and epidemiological information. The expected result is Negative.  Fact Sheet for Patients:  https://www.moore.com/https://www.fda.gov/media/142436/download  Fact Sheet for Healthcare Providers:  https://www.young.biz/https://www.fda.gov/media/142435/download  This test is no t yet approved or cleared by the [place] FDA and  has been authorized for detection and/or diagnosis of SARS-CoV-2 by FDA under an Emergency Use Authorization (EUA). This EUA will remain  in effect (meaning this test can be used) for the duration of the COVID-19 declaration under Section 564(b)(1) of the Act, 21 U.S.C. section 360bbb-3(b)(1), unless the authorization is terminated or revoked sooner.     Influenza A by PCR NEGATIVE NEGATIVE Final   Influenza B by PCR NEGATIVE NEGATIVE Final    Comment: (NOTE) The Xpert Xpress SARS-CoV-2/FLU/RSV assay is intended as an aid in  the diagnosis of influenza from Nasopharyngeal swab specimens and  should not be used as a sole basis for treatment. Nasal washings and  aspirates are unacceptable for Xpert Xpress SARS-CoV-2/FLU/RSV  testing.  Fact Sheet for Patients: https://www.moore.com/https://www.fda.gov/media/142436/download  Fact Sheet for Healthcare Providers: https://www.young.biz/https://www.fda.gov/media/142435/download  This test is not yet approved or cleared by the [place] FDA and  has been  authorized for detection and/or diagnosis of SARS-CoV-2 by  FDA under an Emergency Use Authorization (EUA). This EUA will remain  in effect (meaning this test can be used) for the duration of the  Covid-19 declaration under Section 564(b)(1) of the Act, 21  U.S.C. section 360bbb-3(b)(1), unless the authorization is  terminated or revoked. Performed at [facility], [address] [place], [place] [address]      Radiology Studies: [place] ASCITES (ABDOMEN LIMITED)  Result Date: 01/16/2020 CLINICAL DATA:  Cirrhosis, ascites EXAM: LIMITED ABDOMEN ULTRASOUND FOR ASCITES TECHNIQUE: Limited ultrasound survey for ascites was performed in all four abdominal quadrants. COMPARISON:  01/09/2020 FINDINGS: Moderate ascites identified throughout the abdomen. Volume of ascites identified is sufficient for paracentesis. IMPRESSION: Sufficient ascites for paracentesis. Electronically Signed   By: Ulyses SouthwardMark  Boles M.D.   On: 01/16/2020 13:57    Scheduled Meds: . LORazepam  2 mg Intravenous Once  . scopolamine  1 patch Transdermal Q72H   Continuous Infusions:    LOS: 3 days  Time spent: 25 minutes    Vassie Loll, MD [facility]   To contact the attending provider between 7A-7P or the covering provider during after hours 7P-7A, please log into the web site [email or web address] and access using universal [place] password for that web site. If you do not have the password, please call the hospital operator.  01/18/2020, 9:27 AM

## 2020-01-18 NOTE — TOC Progression Note (Signed)
Transition of Care [facility]) - Progression Note    Patient Details  Name: Alan Phillips MRN: [id number] Date of Birth: 1965-09-01  Transition of [facility]) CM/SW Contact  Barry Brunner, LCSW Phone Number: 01/18/2020, 3:05 PM  Clinical Narrative:    CSW received hospice consult for patient. CSW contacted patient's spouse to inquire able agreeableness to referral. Patient's spouse agreeable to hospice referral with [facility]. CSW contacted [employer] for referral. Misty Stanley with [facility] requested H&P, progress note, and medications be faxed to [phone]. CSW faxed documents to provided number. TOC to follow.         Expected Discharge Plan and Services                                                 Social Determinants of Health (SDOH) Interventions    Readmission Risk Interventions Readmission Risk Prevention Plan 01/06/2020 11/16/2019  Transportation Screening Complete Complete  PCP or Specialist Appt within 3-5 Days Complete Complete  HRI or Home Care Consult Complete Complete  Social Work Consult for Recovery Care Planning/Counseling Complete Complete  Palliative Care Screening Not Applicable Not Applicable  Medication Review [employer]) Complete Complete

## 2020-01-19 ENCOUNTER — Encounter (HOSPITAL_COMMUNITY): Payer: Self-pay | Admitting: Internal Medicine

## 2020-01-19 DIAGNOSIS — Z66 Do not resuscitate: Secondary | ICD-10-CM

## 2020-01-19 DIAGNOSIS — Z515 Encounter for palliative care: Secondary | ICD-10-CM

## 2020-01-19 DIAGNOSIS — Z7189 Other specified counseling: Secondary | ICD-10-CM

## 2020-01-19 MED ORDER — SCOPOLAMINE 1 MG/3DAYS TD PT72: 1.0000 | MEDICATED_PATCH | TRANSDERMAL | 1 refills | Status: AC

## 2020-01-19 MED ORDER — LORAZEPAM 1 MG PO TABS: 1.0000 mg | ORAL_TABLET | Freq: Four times a day (QID) | ORAL | 0 refills | Status: AC | PRN

## 2020-01-19 MED ORDER — MORPHINE SULFATE (CONCENTRATE) 20 MG/ML PO SOLN: 5.0000 mg | ORAL | 0 refills | Status: AC | PRN

## 2020-01-19 NOTE — Discharge Summary (Signed)
Physician Discharge Summary  Sabino DonovanJoseph Strickler ZOX:[id number] DOB: 1965-09-30 DOA: 01/15/2020  PCP: Patient, No Pcp Per  Admit date: 01/15/2020 Discharge date: 01/19/2020  Time spent: 35 minutes  Recommendations for Outpatient Follow-up:  1. Symptomatic management 2. Comfort and end-of-life care 3. No future hospitalizations anticipated, unless comfort cannot be achieved at home. 4. Hospice will follow patient after discharge.   Discharge Diagnoses:  Principal Problem:   Acute hepatic encephalopathy Active Problems:   Hepatic cirrhosis (HCC)   Hyponatremia   Prolonged QT interval   Hyperammonemia (HCC)   Acute renal failure superimposed on stage 3b chronic kidney disease (HCC)   DNR (do not resuscitate)   End of life care   Discharge Condition: Stable.  Plan is to go home with hospice follow-up for further symptomatic management and end-of-life care.  CODE STATUS: DNR/DNI.  Diet recommendation: Comfort feeding (full liquid diet).  Filed Weights   01/15/20 1121  Weight: 72 kg    History of present illness:  Gretchen PortelaJoseph Hankinsis a 54 y.o.malewith medical history significant ofend-stage liver disease, chronic kidney disease a stage IIIb, gastroesophageal flux disease, grade 1 esophageal varices and recent admission secondary to hepatorenal syndrome. Who presented to the hospital secondary to worsening mentation and inability to move his bowels at home. Family reports to be compliant with his medication (specifically lactulose) amylase in the last 24-36 hours prior to admission. At this moment patient is taking too long to answer questions is confused, oriented mainly to person only and having difficulty maintaining adequate hydration. There is no fever, no nausea, no vomiting, no overt bleeding, no focal weakness, no chest pain, no cough, no hematemesis, no dysuria or hematuria. No complaints of abdominal pain. Positive ascites reported andwith pending appointment for  outpatient paracentesis plannedfor 01/16/2020.   ED Course:blood work demonstrating acute on chronic renal failure, lactic acidosis, leukocytosis and dehydration. Gentle fluids initiated, TRH call to admit patient for further evaluation and management. Due to elevated ammonia level lactulose was given. Chest x-ray without acute cardiopulmonary process. Abdominal x-ray demonstrated no bowel obstruction.  Hospital Course:  1-acute hepatic encephalopathy -Elevated ammonia level on presentation -Patient with end-stage liver cirrhosis -UDS was positive for marijuana. -Despite the use of lactulose enema and fluid resuscitation; patient mentation has failed to improved back to baseline; after further discussion with family member decision was made to proceed with comfort care and hospice only. -End-of-life protocol initiated; on 01/18/2020 he has regained some consciousness but continued to be significantly weak, deconditioned and with minimal capacity to maintain nutrition.  -After talking with patient's wife at bedside on 11/14; decision has been made to advance diet to full liquid and assess his tolerance with intention to pursued discharge home with hospice follow-up today 01/19/2020.  -Palliative care has been consulted for hospice candidacy and to assist with symptomatic management.  2-Hepatic cirrhosis (HCC) -Unable to take p.o.'s; rifaximin and lactulose will be discontinue currently -will focus on comfort care only  3-Hyponatremia -chronic in the setting of cirrhosis -from last blood work, close to baseline -no further blood draws anticipated -Patient has been transitioned to comfort care only.  4-Prolonged QT interval -Planning to focus on comfort care only -Medication will be used despite potential side effects of prolonged QT  5-Acute renal failure superimposed on stage 3b chronic kidney disease (HCC) -In the setting of hepatorenal syndrome -No further blood work will  be pursued -Plan is to transition patient to comfort care and symptomatic management only. -Overall poor prognosis (less than 4-6 weeks).  6-DNR/DNI -At this moment they have decided that he has suffer enough and understand that his condition is incurable. --Extensive discussion with patient's family at bedside (wife and mother) regarding GOC and advance care planning, has triggered decision for symptomatic management only and comfort care..  -Palliative care consulted to assist with symptomatic management. -Patient's condition has stabilized overall and family would like to take him home with hospice follow-up. -Hospice has been made aware and the patient will be discharged home with the scopolamine patch, Ativan and Roxanol.  Procedures: See below for x-ray reports.  Consultations:  Palliative care  Hospice  Nephrology service  Gastroenterology  Discharge Exam: Vitals:   01/17/20 2248 01/18/20 2003  BP: 112/70 107/72  Pulse: (!) 121 99  Resp: (!) 23   Temp: 99.7 F (37.6 C) (!) 97.5 F (36.4 C)  SpO2: 93% 93%   General exam: Awake, following simple commands and in no major distress.  Dry mouth have been reported by wife at bedside.  Improvement in upper airway secretions clearance appreciated.  No requiring oxygen supplementation.  Family wants to take him home with hospice follow-up. Respiratory system: Positive scattered rhonchi; no wheezing, no using accessory muscles. Cardiovascular system: Sinus tachycardia.  No JVD.  No rubs or gallops.  Gastrointestinal system: Abdomen is nontender to palpation; distended with positive ascites.  None incarcerated umbilical hernia on exam.  Positive bowel sounds. Central nervous system: Limited assessment with current mentation. Extremities: No cyanosis or clubbing. Skin: No petechiae. Psychiatry: Limited assessment with current mentation; no agitation, no hallucinations.  Slow to respond.    Discharge Instructions   Discharge  Instructions    Discharge instructions   Complete by: As directed    -Full liquid diet comfort feeding. -Symptomatic management and comfort care. -Hospice to follow patient at home and assist with any equipment needed.     Allergies as of 01/19/2020      Reactions   Codeine Hives, Itching, Swelling   Other reaction(s): Dizziness      Medication List    STOP taking these medications   baclofen 10 MG tablet Commonly known as: LIORESAL   Constulose 10 GM/15ML solution Generic drug: lactulose   midodrine 10 MG tablet Commonly known as: PROAMATINE   rifaximin 550 MG Tabs tablet Commonly known as: XIFAXAN   sodium bicarbonate 650 MG tablet   Zinc 50 MG Tabs     TAKE these medications   acetaminophen 500 MG tablet Commonly known as: TYLENOL Take 500 mg by mouth every 6 (six) hours as needed for headache.   LORazepam 1 MG tablet Commonly known as: ATIVAN Take 1 tablet (1 mg total) by mouth every 6 (six) hours as needed for anxiety or sleep (comfort).   morphine 20 MG/ML concentrated solution Commonly known as: ROXANOL Take 0.25 mLs (5 mg total) by mouth every 2 (two) hours as needed for severe pain or shortness of breath (comfort).   ondansetron 4 MG tablet Commonly known as: ZOFRAN Take 1 tablet (4 mg total) by mouth every 8 (eight) hours as needed for nausea or vomiting.   pantoprazole 40 MG tablet Commonly known as: Protonix Take 1 tablet (40 mg total) by mouth 2 (two) times daily before a meal.   scopolamine 1 MG/3DAYS Commonly known as: TRANSDERM-SCOP Place 1 patch (1.5 mg total) onto the skin every 3 (three) days.      Allergies  Allergen Reactions  . Codeine Hives, Itching and Swelling    Other reaction(s): Dizziness    Contact  information for after-discharge care    Destination    [facility] .   Service: Inpatient Hospice Contact information: [address] [place] [address] [phone]                   The results of significant diagnostics from this hospitalization (including imaging, microbiology, ancillary and laboratory) are listed below for reference.    Significant Diagnostic Studies: US RENAL  Result Date: 01/05/2020 CLINICAL DATA:  54 year old male with acute on chronic renal failure. EXAM: RENAL / URINARY TRACT ULTRASOUND COMPLETE COMPARISON:  Renal ultrasound 11/14/2019. CT Abdomen and Pelvis 10/16/2019. FINDINGS: Right Kidney: Renal measurements: 10.8 x 3.4 x 4.9 cm (unchanged) = volume: 94 mL. Renal cortical echogenicity is stable since September, at the upper limits of normal to mildly increased. No right hydronephrosis or renal lesion. Left Kidney: Less well visualized today. Estimated renal measurements: 10.2 x 3.5 x 3.4 cm = volume: 63 mL. Left renal echogenicity stable and at the upper limits of normal (image 30). No left hydronephrosis or renal lesion. Bladder: Diminutive, unremarkable. Other: Ascites throughout the abdomen. Nodular, cirrhotic liver (image 14). IMPRESSION: 1. No acute renal finding. Borderline to mildly echogenic kidneys compatible suspicious for medical renal disease. 2. Ascites.  Cirrhosis. Electronically Signed   By: Odessa Fleming M.D.   On: 01/05/2020 16:51   US Paracentesis  Result Date: 01/09/2020 INDICATION: Alcoholic cirrhosis, ascites EXAM: ULTRASOUND GUIDED THERAPEUTIC PARACENTESIS MEDICATIONS: None COMPLICATIONS: None immediate PROCEDURE: Informed written consent was obtained from the patient after a discussion of the risks, benefits and alternatives to treatment. A timeout was performed prior to the initiation of the procedure. Initial ultrasound scanning demonstrates a large amount of ascites within the right lower abdominal quadrant. The right lower abdomen was prepped and draped in the usual sterile fashion. 1% lidocaine was used for local anesthesia. Following this, a 5 [place] Yueh catheter was introduced. An ultrasound image was saved for  documentation purposes. The paracentesis was performed. The catheter was removed and a dressing was applied. The patient tolerated the procedure well without immediate post procedural complication. FINDINGS: A total of approximately 3.0 L of cloudy yellow ascitic fluid was removed. IMPRESSION: Successful ultrasound-guided paracentesis yielding 3.0 L liters of peritoneal fluid. Electronically Signed   By: Ulyses Southward M.D.   On: 01/09/2020 11:58   US Paracentesis  Result Date: 01/07/2020 INDICATION: Alcoholic cirrhosis, ascites EXAM: ULTRASOUND GUIDED THERAPEUTIC PARACENTESIS MEDICATIONS: None COMPLICATIONS: None immediate PROCEDURE: Informed written consent was obtained from the patient after a discussion of the risks, benefits and alternatives to treatment. A timeout was performed prior to the initiation of the procedure. Initial ultrasound scanning demonstrates a large amount of ascites within the right lower abdominal quadrant. The right lower abdomen was prepped and draped in the usual sterile fashion. 1% lidocaine was used for local anesthesia. Following this, a 5 [place] Yueh catheter was introduced. An ultrasound image was saved for documentation purposes. The paracentesis was performed. The catheter was removed and a dressing was applied. The patient tolerated the procedure well without immediate post procedural complication. FINDINGS: A total of approximately 3 L of cloudy light yellow fluid was removed. IMPRESSION: Successful ultrasound-guided paracentesis yielding 3 L liters of peritoneal fluid. Electronically Signed   By: Ulyses Southward M.D.   On: 01/07/2020 16:16   US Paracentesis  Result Date: 12/26/2019 INDICATION: Alcoholic cirrhosis, ascites EXAM: ULTRASOUND GUIDED DIAGNOSTIC AND THERAPEUTIC PARACENTESIS MEDICATIONS: None COMPLICATIONS: None immediate PROCEDURE: Informed written consent was obtained  from the patient after a discussion of the risks, benefits and alternatives to treatment. A  timeout was performed prior to the initiation of the procedure. Initial ultrasound scanning demonstrates a large amount of ascites within the right lower abdominal quadrant. The right lower abdomen was prepped and draped in the usual sterile fashion. 1% lidocaine was used for local anesthesia. Following this, a 5 [place] Yueh catheter was introduced. An ultrasound image was saved for documentation purposes. The paracentesis was performed. The catheter was removed and a dressing was applied. The patient tolerated the procedure well without immediate post procedural complication. Patient received post-procedure intravenous albumin; see nursing notes for details. FINDINGS: A total of approximately 8 L of cloudy yellow fluid was removed. Samples were sent to the laboratory for requested analysis. IMPRESSION: Successful ultrasound-guided paracentesis yielding 8 liters of peritoneal fluid. Electronically Signed   By: Ulyses Southward M.D.   On: 12/26/2019 13:21   DG Chest Portable 1 View  Result Date: 01/15/2020 CLINICAL DATA:  Altered mental status.  Cirrhosis. EXAM: PORTABLE CHEST 1 VIEW COMPARISON:  07/25/2019 FINDINGS: Heart size and vascularity normal. Lungs are clear without infiltrate or effusion. No acute skeletal abnormality. IMPRESSION: No active disease. Electronically Signed   By: Marlan Palau M.D.   On: 01/15/2020 12:33   DG Abd Portable 1 View  Result Date: 01/15/2020 CLINICAL DATA:  Altered mental status.  Cirrhosis. EXAM: PORTABLE ABDOMEN - 1 VIEW COMPARISON:  None. FINDINGS: Normal bowel gas pattern. Distended abdomen likely due to ascites. No urinary tract calculi. IMPRESSION: Probable ascites.  Normal bowel gas pattern. Electronically Signed   By: Marlan Palau M.D.   On: 01/15/2020 12:33   [place] ASCITES (ABDOMEN LIMITED)  Result Date: 01/16/2020 CLINICAL DATA:  Cirrhosis, ascites EXAM: LIMITED ABDOMEN ULTRASOUND FOR ASCITES TECHNIQUE: Limited ultrasound survey for ascites was performed in all  four abdominal quadrants. COMPARISON:  01/09/2020 FINDINGS: Moderate ascites identified throughout the abdomen. Volume of ascites identified is sufficient for paracentesis. IMPRESSION: Sufficient ascites for paracentesis. Electronically Signed   By: Ulyses Southward M.D.   On: 01/16/2020 13:57    Microbiology: Recent Results (from the past 240 hour(s))  Urine culture     Status: Abnormal   Collection Time: 01/15/20  3:00 PM   Specimen: Urine, Clean Catch  Result Value Ref Range Status   Specimen Description   Final    URINE, CLEAN CATCH Performed at [facility], [address], [place], [place] [address]    Special Requests   Final    NONE Performed at [facility], [address] [place], [place] [address]    Culture (A)  Final    <10,000 COLONIES/mL INSIGNIFICANT GROWTH Performed at [facility], [address], [place], [place] [address]    Report Status 01/16/2020 FINAL  Final  Respiratory Panel by RT PCR (Flu A&B, Covid) - Nasopharyngeal Swab     Status: None   Collection Time: 01/15/20  7:30 PM   Specimen: Nasopharyngeal Swab  Result Value Ref Range Status   SARS Coronavirus 2 by RT PCR NEGATIVE NEGATIVE Final    Comment: (NOTE) SARS-CoV-2 target nucleic acids are NOT DETECTED.  The SARS-CoV-2 RNA is generally detectable in upper respiratoy specimens during the acute phase of infection. The lowest concentration of SARS-CoV-2 viral copies this assay can detect is 131 copies/mL. A negative result does not preclude SARS-Cov-2 infection and should not be used as the sole basis for treatment or other patient management decisions. A negative result may occur  with  improper specimen collection/handling, submission of specimen other than nasopharyngeal swab, presence of viral mutation(s) within the areas targeted by this assay, and inadequate number of viral copies (<131 copies/mL). A negative result must be combined with clinical observations, patient history, and  epidemiological information. The expected result is Negative.  Fact Sheet for Patients:  https://www.moore.com/  Fact Sheet for Healthcare Providers:  https://www.young.biz/  This test is no t yet approved or cleared by the [place] FDA and  has been authorized for detection and/or diagnosis of SARS-CoV-2 by FDA under an Emergency Use Authorization (EUA). This EUA will remain  in effect (meaning this test can be used) for the duration of the COVID-19 declaration under Section 564(b)(1) of the Act, 21 U.S.C. section 360bbb-3(b)(1), unless the authorization is terminated or revoked sooner.     Influenza A by PCR NEGATIVE NEGATIVE Final   Influenza B by PCR NEGATIVE NEGATIVE Final    Comment: (NOTE) The Xpert Xpress SARS-CoV-2/FLU/RSV assay is intended as an aid in  the diagnosis of influenza from Nasopharyngeal swab specimens and  should not be used as a sole basis for treatment. Nasal washings and  aspirates are unacceptable for Xpert Xpress SARS-CoV-2/FLU/RSV  testing.  Fact Sheet for Patients: https://www.moore.com/  Fact Sheet for Healthcare Providers: https://www.young.biz/  This test is not yet approved or cleared by the [place] FDA and  has been authorized for detection and/or diagnosis of SARS-CoV-2 by  FDA under an Emergency Use Authorization (EUA). This EUA will remain  in effect (meaning this test can be used) for the duration of the  Covid-19 declaration under Section 564(b)(1) of the Act, 21  U.S.C. section 360bbb-3(b)(1), unless the authorization is  terminated or revoked. Performed at [facility] [address], [place], [place] [address]      Labs: Basic Metabolic Panel: Recent Labs  Lab 01/15/20 1234 01/16/20 0433 01/17/20 0904  NA 130* 130* 134*  K 4.1 4.2 4.0  CL 95* 97* 101  CO2 20* 20* 21*  GLUCOSE 121* 137* 84  BUN 44* 49* 54*  CREATININE 3.03* 2.68*  2.07*  CALCIUM 9.3 9.1 9.0  MG 2.1  --   --    Liver Function Tests: Recent Labs  Lab 01/15/20 1234 01/16/20 0433 01/17/20 0904  AST 43* 33 34  ALT 20 18 17   ALKPHOS 80 65 63  BILITOT 4.5* 4.6* 4.9*  PROT 6.6 6.2* 5.7*  ALBUMIN 3.8 4.0 3.7    Recent Labs  Lab 01/15/20 1234  AMMONIA 100*   CBC: Recent Labs  Lab 01/15/20 1234 01/16/20 0433 01/17/20 0904  WBC 16.8* 18.5* 11.9*  NEUTROABS 14.5*  --   --   HGB 9.6* 8.4* 8.8*  HCT 28.0* 25.0* 26.3*  MCV 100.4* 102.9* 101.2*  PLT 185 127* 148*   BNP (last 3 results) Recent Labs    06/18/19 1619  BNP 38.0   CBG: Recent Labs  Lab 01/16/20 1309  GLUCAP 132*    Signed:  13/12/21 MD.  [facility] 01/19/2020, 11:45 AM

## 2020-01-19 NOTE — Telephone Encounter (Signed)
Our team has followed with patient while hospitalized. He has transitioned to comfort care/hospice care.

## 2020-01-19 NOTE — TOC Transition Note (Signed)
Transition of Care [facility]) - CM/SW Discharge Note  Patient Details  Name: Alan Phillips MRN: [id number] Date of Birth: [birth year]  [facility] CM/SW Contact:  Ewing Schlein, LCSW Phone Number: 01/19/2020, 10:57 AM  Clinical Narrative: Patient will be discharging home with hospice services. CSW spoke with patient's wife, Darivs Lunden, regarding hospice services. Wife confirmed she wants the patient to discharge home with her and only anticipates needing a hospital bed at this time.  CSW notified Cassandra with [facility] that patient will discharge home rather than to [facility]. Cassandra to follow up with wife and notify TOC once DME is delivered.  CSW notified by Cassandra the equipment has been delivered. EMS scheduled. TOC signing off.   Final next level of care: Home w Hospice Care Barriers to Discharge: Barriers Resolved  Patient Goals and CMS Choice Patient states their goals for this hospitalization and ongoing recovery are:: Discharge home with hospice CMS Medicare.gov Compare Post Acute Care list provided to:: Patient Represenative (must comment) Alain Honey (wife)) Choice offered to / list presented to : Spouse  Discharge Plan and Services        DME Arranged: Hospital bed DME Agency: [facility] Agency: [facility]  Readmission Risk Interventions Readmission Risk Prevention Plan 01/06/2020 11/16/2019  Transportation Screening Complete Complete  PCP or Specialist Appt within 3-5 Days Complete Complete  HRI or Home Care Consult Complete Complete  Social Work Consult for Recovery Care Planning/Counseling Complete Complete  Palliative Care Screening Not Applicable Not Applicable  Medication Review Oceanographer) Complete Complete

## 2020-01-19 NOTE — Telephone Encounter (Signed)
FYI, pt had labs at [facility] hospital that have resulted in Epic.

## 2020-01-19 NOTE — Plan of Care (Signed)
Palliative: Palliative consult referral noted.   Mr. Bunn has been transitioned to comfort care, residential hospice today.   Goals are set, no needs at this time.  Conference with transition of care team related to patient condition, needs, goals of care, hospice referral.  Plan: Comfort and dignity at end-of-life, residential hospice at [facility].  No charge Lillia Carmel, NP Palliative medicine team Team phone [phone] Greater than 50% of this time was spent counseling and coordinating care related to the above assessment and plan.

## 2020-01-19 NOTE — Plan of Care (Signed)

## 2020-01-19 NOTE — Progress Notes (Signed)
Nsg Discharge Note  Admit Date:  01/15/2020 Discharge date: 01/19/2020   Alan Phillips to be D/C'd Home per MD order.  AVS completed.  Patient's wife Alan Phillips able to verbalize understanding.  Discharge Medication: Allergies as of 01/19/2020      Reactions   Codeine Hives, Itching, Swelling   Other reaction(s): Dizziness      Medication List    STOP taking these medications   baclofen 10 MG tablet Commonly known as: LIORESAL   Constulose 10 GM/15ML solution Generic drug: lactulose   midodrine 10 MG tablet Commonly known as: PROAMATINE   rifaximin 550 MG Tabs tablet Commonly known as: XIFAXAN   sodium bicarbonate 650 MG tablet   Zinc 50 MG Tabs     TAKE these medications   acetaminophen 500 MG tablet Commonly known as: TYLENOL Take 500 mg by mouth every 6 (six) hours as needed for headache.   LORazepam 1 MG tablet Commonly known as: ATIVAN Take 1 tablet (1 mg total) by mouth every 6 (six) hours as needed for anxiety or sleep (comfort).   morphine 20 MG/ML concentrated solution Commonly known as: ROXANOL Take 0.25 mLs (5 mg total) by mouth every 2 (two) hours as needed for severe pain or shortness of breath (comfort).   ondansetron 4 MG tablet Commonly known as: ZOFRAN Take 1 tablet (4 mg total) by mouth every 8 (eight) hours as needed for nausea or vomiting.   pantoprazole 40 MG tablet Commonly known as: Protonix Take 1 tablet (40 mg total) by mouth 2 (two) times daily before a meal.   scopolamine 1 MG/3DAYS Commonly known as: TRANSDERM-SCOP Place 1 patch (1.5 mg total) onto the skin every 3 (three) days.       Discharge Assessment: Vitals:   01/18/20 2003 01/19/20 1424  BP: 107/72 108/77  Pulse: 99 96  Resp:  16  Temp: (!) 97.5 F (36.4 C) (!) 97.4 F (36.3 C)  SpO2: 93% 95%   Skin clean, dry and intact without evidence of skin break down, no evidence of skin tears noted. IV catheter discontinued intact. Site without signs and symptoms  of complications - no redness or edema noted at insertion site, patient denies c/o pain - only slight tenderness at site.  Dressing with slight pressure applied.  D/c Instructions-Education: Discharge instructions given to patient/family with verbalized understanding. D/c education completed with patient's wife Alan Phillips including follow up instructions, medication list, d/c activities limitations if indicated, with other d/c instructions as indicated by MD - patient able to verbalize understanding, all questions fully answered. Patient's wife instructed to return to ED, call 911, or call MD for any changes in condition.  Patient escorted via WC, and D/C home via private auto.  Jethro Poling, RN 01/19/2020 5:03 PM

## 2020-02-03 ENCOUNTER — Ambulatory Visit: Payer: Self-pay | Admitting: Internal Medicine

## 2020-02-04 DEATH — deceased

## 2021-12-11 IMAGING — US US PARACENTESIS
1 series · 2 of 2 positions shown · non-contrast
Comparison: none

INDICATION: Cirrhosis; ascites

[Series 1: us paracentesis · 2 of 2 slices shown]
[im 1/2]
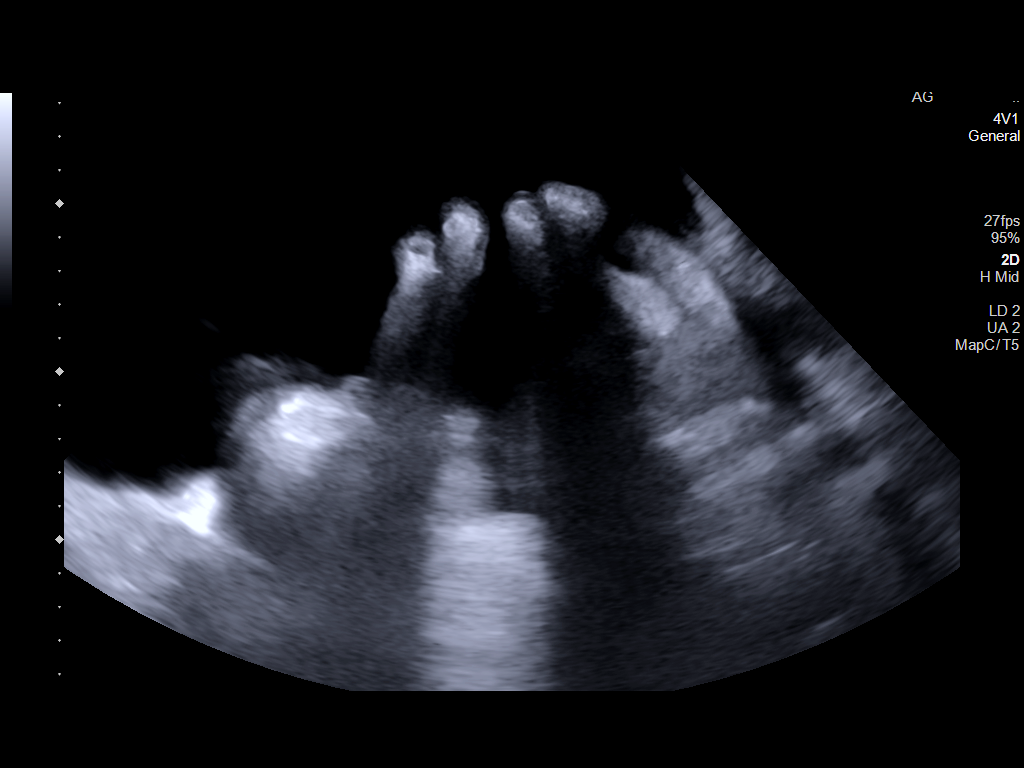
[im 2/2]
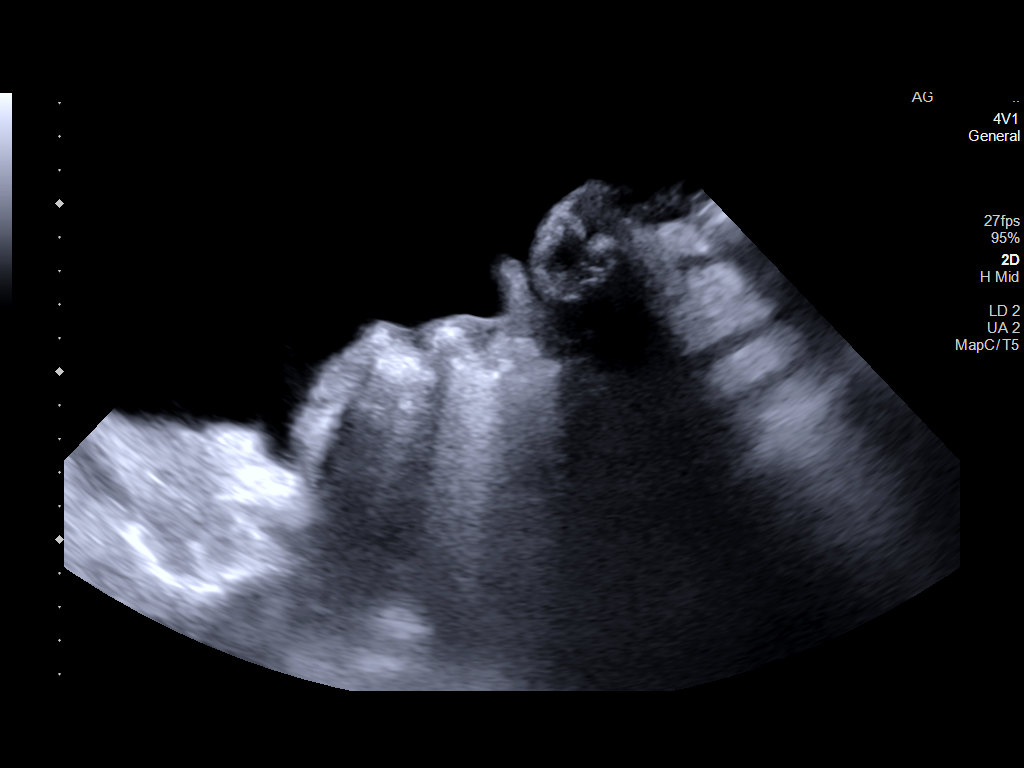

[2 of 2 positions shown; findings below may reference images not displayed]

EXAM:
ULTRASOUND GUIDED LLQ PARACENTESIS

MEDICATIONS:
10 cc 1% lidocaine

COMPLICATIONS:
None immediate.

PROCEDURE:
Informed written consent was obtained from the patient after a
discussion of the risks, benefits and alternatives to treatment. A
timeout was performed prior to the initiation of the procedure.

Initial ultrasound scanning demonstrates a large amount of ascites
within the left lower abdominal quadrant. The left lower abdomen was
prepped and draped in the usual sterile fashion. 1% lidocaine was
used for local anesthesia.

Following this, a 19 G Yueh catheter was introduced. An ultrasound
image was saved for documentation purposes. The paracentesis was
performed. The catheter was removed and a dressing was applied. The
patient tolerated the procedure well without immediate post
procedural complication.
Patient received post-procedure intravenous albumin; see nursing
notes for details.
FINDINGS: A total of approximately 8 liters of yellow fluid was removed.
Samples were sent to the laboratory as requested by the clinical
team.
IMPRESSION: Successful ultrasound-guided paracentesis yielding 8 liters of
peritoneal fluid.

Read by

Humphrey Echeverry

## 2022-02-19 IMAGING — US US PARACENTESIS
1 series · 2 of 2 positions shown · non-contrast
Comparison: none

INDICATION: Alcoholic cirrhosis, ascites

[Series 1: us paracentesis · 2 of 2 slices shown]
[im 1/2]
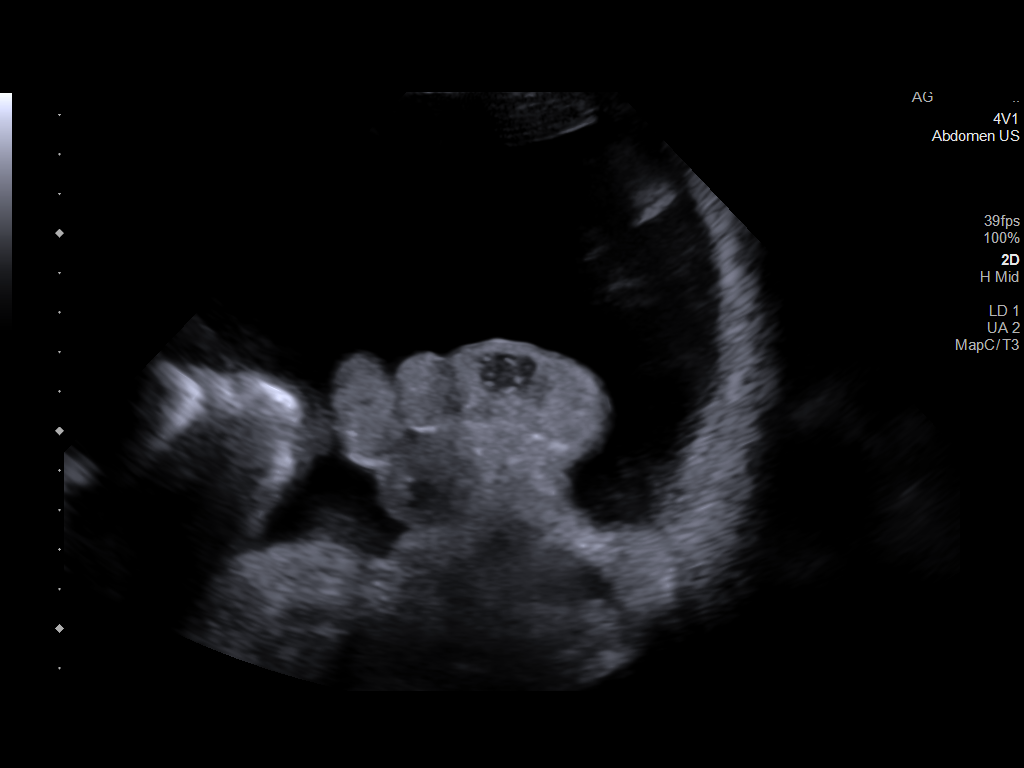
[im 2/2]
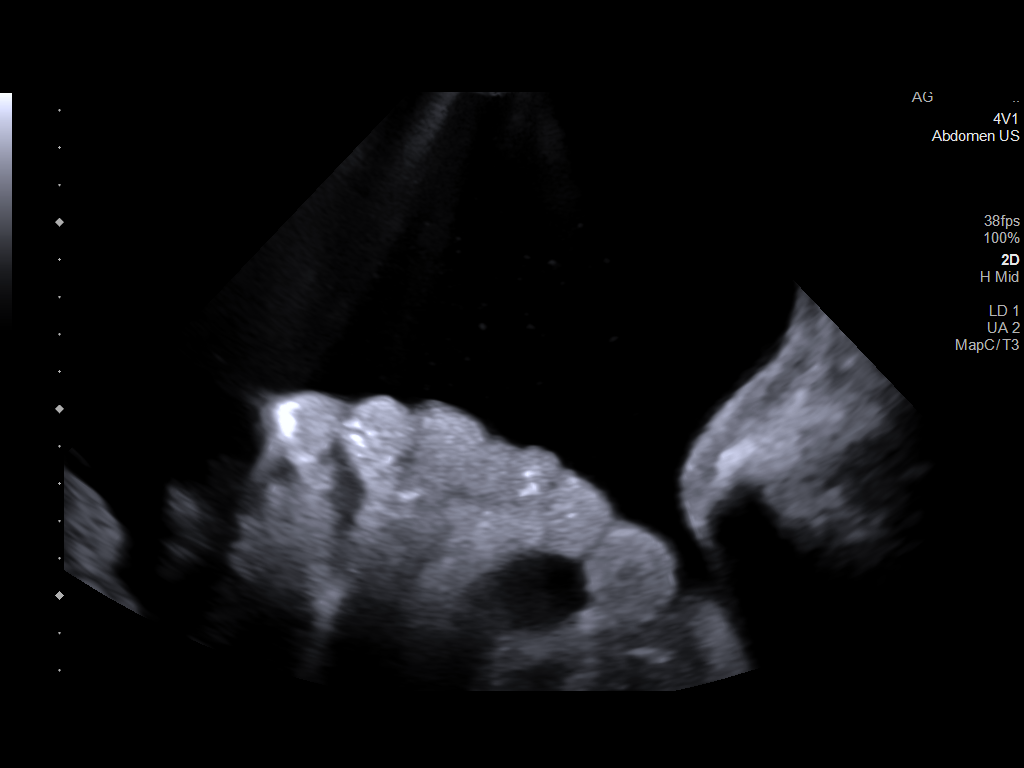

[2 of 2 positions shown; findings below may reference images not displayed]

EXAM:
ULTRASOUND GUIDED DIAGNOSTIC AND THERAPEUTIC PARACENTESIS

MEDICATIONS:
None

COMPLICATIONS:
None immediate

PROCEDURE:
Informed written consent was obtained from the patient after a
discussion of the risks, benefits and alternatives to treatment. A
timeout was performed prior to the initiation of the procedure.

Initial ultrasound scanning demonstrates a large amount of ascites
within the LEFT lower abdominal quadrant. The right lower abdomen
was prepped and draped in the usual sterile fashion. 1% lidocaine
was used for local anesthesia.

Following this, a 5 French Yueh catheter was introduced. An
ultrasound image was saved for documentation purposes. The
paracentesis was performed. The catheter was removed and a dressing
was applied. The patient tolerated the procedure well without
immediate post procedural complication.
Patient received post-procedure intravenous albumin; see nursing
notes for details.
FINDINGS: A total of approximately 6.2 L of slightly cloudy yellow ascitic
fluid was removed.

Samples were sent to the laboratory as requested by the clinical
team.
IMPRESSION: Successful ultrasound-guided paracentesis yielding 6.2 liters of
peritoneal fluid.

## 2022-03-16 IMAGING — US US PARACENTESIS
1 series · 1 of 1 positions shown · non-contrast
Comparison: none

INDICATION: Ascites.

[Series 1: us paracentesis · 1 of 1 slices shown]
[im 1/1]
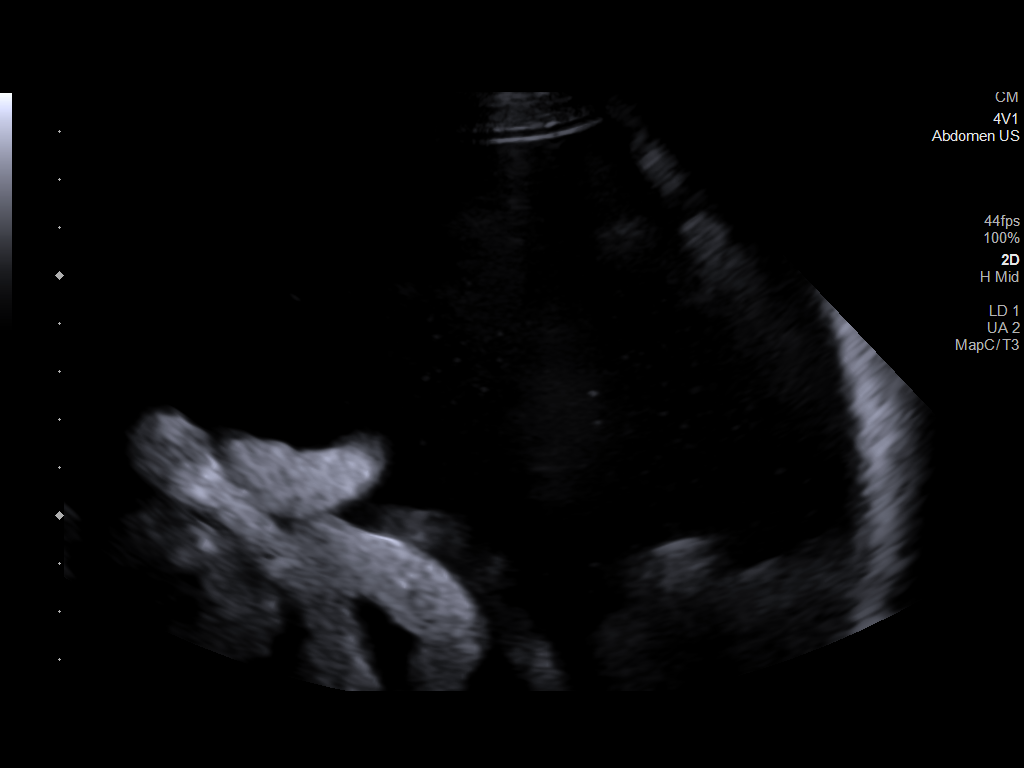

[1 of 1 positions shown; findings below may reference images not displayed]

EXAM:
ULTRASOUND GUIDED therapeutic and diagnostic PARACENTESIS

MEDICATIONS:
None.

COMPLICATIONS:
None immediate.

PROCEDURE:
Informed written consent was obtained from the patient after a
discussion of the risks, benefits and alternatives to treatment. A
timeout was performed prior to the initiation of the procedure.

Initial ultrasound scanning demonstrates a large amount of ascites
within the right lower abdominal quadrant. The right lower abdomen
was prepped and draped in the usual sterile fashion. 1% lidocaine
was used for local anesthesia.

Following this, a paracentesis catheter was introduced. An
ultrasound image was saved for documentation purposes. The
paracentesis was performed. The catheter was removed and a dressing
was applied. The patient tolerated the procedure well without
immediate post procedural complication.
FINDINGS: A total of approximately 8 L of serous fluid was removed. Samples
were sent to the laboratory as requested by the clinical team.
IMPRESSION: Successful ultrasound-guided paracentesis yielding 8 liters of
peritoneal fluid.

## 2022-04-10 IMAGING — US US PARACENTESIS
1 series · 1 of 1 positions shown · non-contrast
Comparison: none

INDICATION: Alcoholic cirrhosis, ascites

[Series 1: us paracentesis · 1 of 1 slices shown]
[im 1/1]
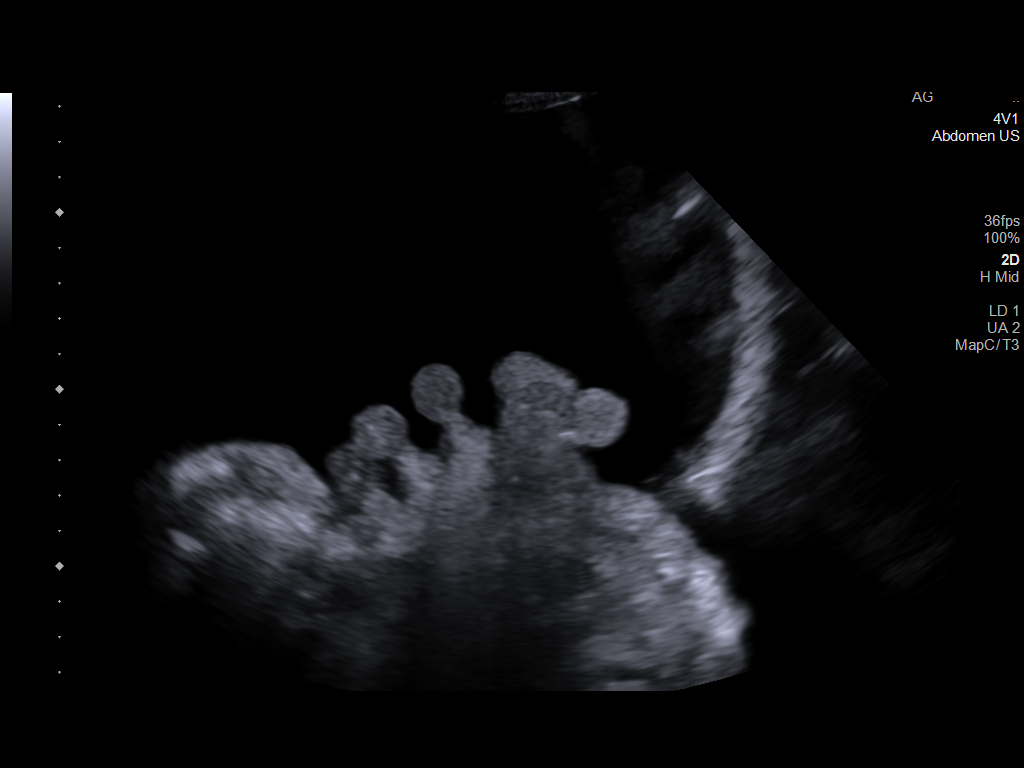

[1 of 1 positions shown; findings below may reference images not displayed]

EXAM:
ULTRASOUND GUIDED DIAGNOSTIC AND THERAPEUTIC PARACENTESIS

MEDICATIONS:
None

COMPLICATIONS:
No immediate

PROCEDURE:
Informed written consent was obtained from the patient after a
discussion of the risks, benefits and alternatives to treatment. A
timeout was performed prior to the initiation of the procedure.

Initial ultrasound scanning demonstrates a large amount of ascites
within the LEFT lower abdominal quadrant. The right lower abdomen
was prepped and draped in the usual sterile fashion. 1% lidocaine
was used for local anesthesia.

Following this, a 5 French Yueh catheter was introduced. An
ultrasound image was saved for documentation purposes. The
paracentesis was performed. The catheter was removed and a dressing
was applied. The patient tolerated the procedure well without
immediate post procedural complication.
Patient received post-procedure intravenous albumin; see nursing
notes for details.
FINDINGS: A total of approximately 6.2 L of cloudy yellow ascitic fluid was
removed.

Samples were sent to the laboratory as requested by the clinical
team.
IMPRESSION: Successful ultrasound-guided paracentesis yielding 6.2 liters of
peritoneal fluid.

## 2022-04-16 IMAGING — US US PARACENTESIS
1 series · 2 of 2 positions shown · non-contrast
Comparison: none

INDICATION: Recurrent ascites

[Series 1: us paracentesis · 2 of 2 slices shown]
[im 1/2]
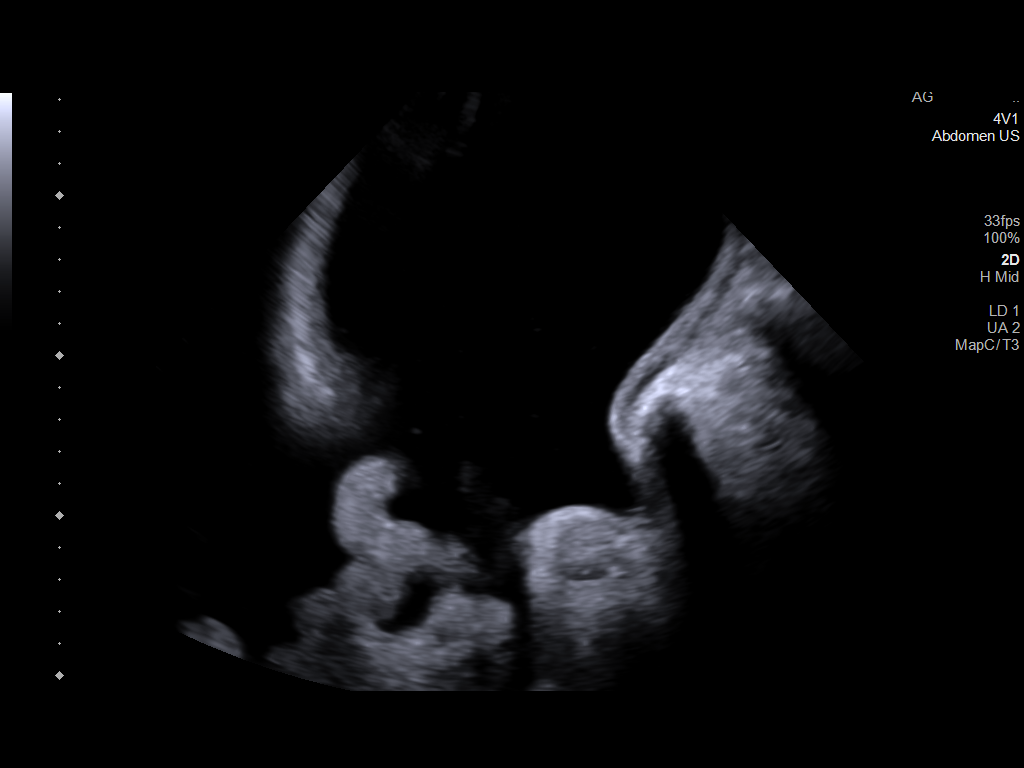
[im 2/2]
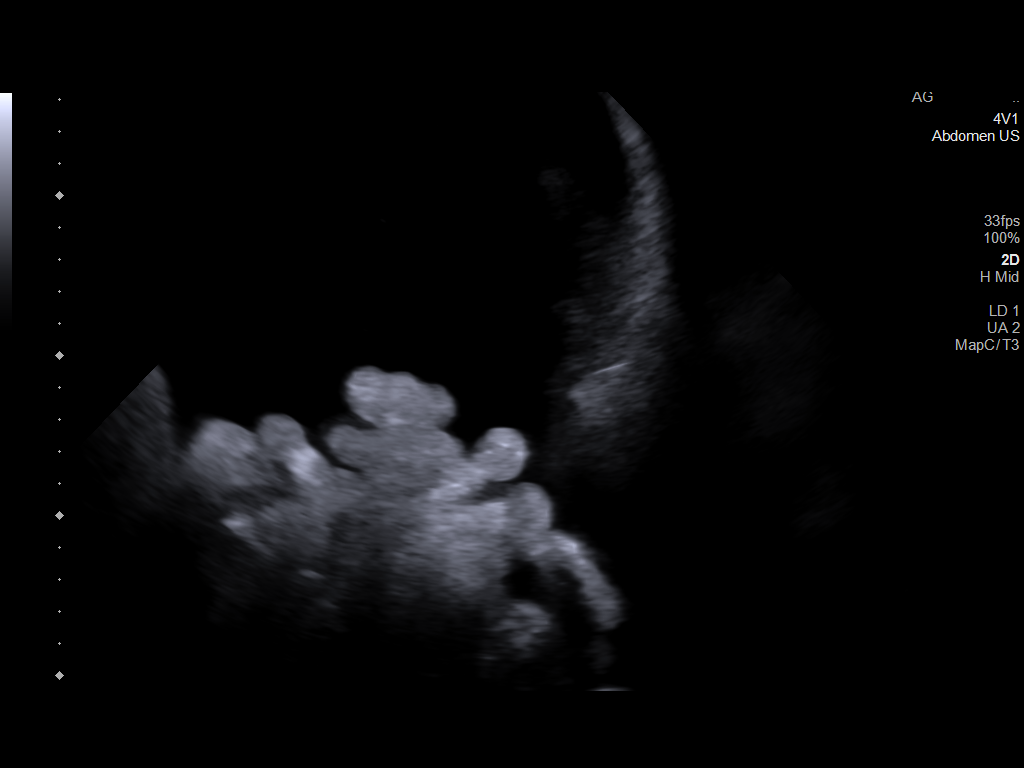

[2 of 2 positions shown; findings below may reference images not displayed]

Alcoholic cirrhosis

EXAM:
ULTRASOUND GUIDED RLQ PARACENTESIS

MEDICATIONS:
10 cc 1% lidocaine

COMPLICATIONS:
None immediate.

PROCEDURE:
Informed written consent was obtained from the patient after a
discussion of the risks, benefits and alternatives to treatment. A
timeout was performed prior to the initiation of the procedure.

Initial ultrasound scanning demonstrates a large amount of ascites
within the right lower abdominal quadrant. The right lower abdomen
was prepped and draped in the usual sterile fashion. 1% lidocaine
was used for local anesthesia.

Following this, a 19 gauge Yueh catheter was introduced. An
ultrasound image was saved for documentation purposes. The
paracentesis was performed. The catheter was removed and a dressing
was applied. The patient tolerated the procedure well without
immediate post procedural complication.
Patient received post-procedure intravenous albumin; see nursing
notes for details.
FINDINGS: A total of approximately 6 liters (maximum) of milky yellow fluid
was removed. Samples were sent to the laboratory as requested by the
clinical team.
IMPRESSION: Successful ultrasound-guided paracentesis yielding 6 liters of
peritoneal fluid.

Read by

Jacques Hernandez Martinez
